# Patient Record
Sex: Male | Born: 1935 | Race: Black or African American | Hispanic: No | Smoking: Former smoker
Health system: Southern US, Community
[De-identification: ages and names within clinical notes are randomized; demographics above are authoritative.]

## PROBLEM LIST (undated history)

## (undated) DIAGNOSIS — D509 Iron deficiency anemia, unspecified: Secondary | ICD-10-CM

## (undated) DIAGNOSIS — I00 Rheumatic fever without heart involvement: Secondary | ICD-10-CM

## (undated) DIAGNOSIS — R195 Other fecal abnormalities: Secondary | ICD-10-CM

## (undated) DIAGNOSIS — N183 Chronic kidney disease, stage 3 unspecified: Secondary | ICD-10-CM

## (undated) DIAGNOSIS — I350 Nonrheumatic aortic (valve) stenosis: Secondary | ICD-10-CM

## (undated) DIAGNOSIS — I2510 Atherosclerotic heart disease of native coronary artery without angina pectoris: Secondary | ICD-10-CM

## (undated) DIAGNOSIS — I10 Essential (primary) hypertension: Secondary | ICD-10-CM

## (undated) DIAGNOSIS — K219 Gastro-esophageal reflux disease without esophagitis: Secondary | ICD-10-CM

## (undated) DIAGNOSIS — C61 Malignant neoplasm of prostate: Secondary | ICD-10-CM

## (undated) DIAGNOSIS — M1990 Unspecified osteoarthritis, unspecified site: Secondary | ICD-10-CM

## (undated) HISTORY — PX: UMBILICAL HERNIA REPAIR: SHX2598

## (undated) HISTORY — PX: PROSTATE SURGERY: SHX751

## (undated) HISTORY — PX: CAROTID ENDARTERECTOMY: SUR193

---

## 2001-05-08 ENCOUNTER — Ambulatory Visit (HOSPITAL_COMMUNITY): Admission: RE | Admit: 2001-05-08 | Discharge: 2001-05-08 | Payer: Self-pay | Admitting: General Surgery

## 2001-12-27 ENCOUNTER — Encounter: Payer: Self-pay | Admitting: Family Medicine

## 2001-12-27 ENCOUNTER — Ambulatory Visit (HOSPITAL_COMMUNITY): Admission: RE | Admit: 2001-12-27 | Discharge: 2001-12-27 | Payer: Self-pay | Admitting: Family Medicine

## 2002-06-25 ENCOUNTER — Encounter: Payer: Self-pay | Admitting: Emergency Medicine

## 2002-06-25 ENCOUNTER — Emergency Department (HOSPITAL_COMMUNITY): Admission: EM | Admit: 2002-06-25 | Discharge: 2002-06-25 | Payer: Self-pay | Admitting: Emergency Medicine

## 2002-07-02 ENCOUNTER — Ambulatory Visit (HOSPITAL_COMMUNITY): Admission: RE | Admit: 2002-07-02 | Discharge: 2002-07-02 | Payer: Self-pay | Admitting: Neurology

## 2002-07-02 ENCOUNTER — Encounter: Payer: Self-pay | Admitting: Neurology

## 2002-07-04 ENCOUNTER — Encounter: Payer: Self-pay | Admitting: Vascular Surgery

## 2002-07-07 ENCOUNTER — Inpatient Hospital Stay (HOSPITAL_COMMUNITY): Admission: RE | Admit: 2002-07-07 | Discharge: 2002-07-08 | Payer: Self-pay | Admitting: Vascular Surgery

## 2002-07-07 ENCOUNTER — Encounter (INDEPENDENT_AMBULATORY_CARE_PROVIDER_SITE_OTHER): Payer: Self-pay | Admitting: *Deleted

## 2002-08-27 ENCOUNTER — Ambulatory Visit (HOSPITAL_COMMUNITY): Admission: RE | Admit: 2002-08-27 | Discharge: 2002-08-27 | Payer: Self-pay | Admitting: General Surgery

## 2002-12-17 ENCOUNTER — Ambulatory Visit (HOSPITAL_COMMUNITY): Admission: RE | Admit: 2002-12-17 | Discharge: 2002-12-17 | Payer: Self-pay | Admitting: Family Medicine

## 2002-12-17 ENCOUNTER — Encounter: Payer: Self-pay | Admitting: Family Medicine

## 2004-01-08 ENCOUNTER — Emergency Department (HOSPITAL_COMMUNITY): Admission: EM | Admit: 2004-01-08 | Discharge: 2004-01-08 | Payer: Self-pay | Admitting: *Deleted

## 2004-01-18 ENCOUNTER — Ambulatory Visit (HOSPITAL_COMMUNITY): Admission: RE | Admit: 2004-01-18 | Discharge: 2004-01-18 | Payer: Self-pay | Admitting: Family Medicine

## 2004-08-17 ENCOUNTER — Emergency Department (HOSPITAL_COMMUNITY): Admission: EM | Admit: 2004-08-17 | Discharge: 2004-08-18 | Payer: Self-pay | Admitting: *Deleted

## 2005-02-07 ENCOUNTER — Inpatient Hospital Stay (HOSPITAL_COMMUNITY): Admission: AD | Admit: 2005-02-07 | Discharge: 2005-02-17 | Payer: Self-pay | Admitting: Family Medicine

## 2005-02-27 ENCOUNTER — Emergency Department (HOSPITAL_COMMUNITY): Admission: EM | Admit: 2005-02-27 | Discharge: 2005-02-27 | Payer: Self-pay | Admitting: *Deleted

## 2005-10-02 ENCOUNTER — Ambulatory Visit (HOSPITAL_COMMUNITY): Admission: RE | Admit: 2005-10-02 | Discharge: 2005-10-02 | Payer: Self-pay | Admitting: Family Medicine

## 2006-08-17 ENCOUNTER — Ambulatory Visit (HOSPITAL_COMMUNITY): Admission: RE | Admit: 2006-08-17 | Discharge: 2006-08-17 | Payer: Self-pay | Admitting: Family Medicine

## 2006-08-23 ENCOUNTER — Ambulatory Visit (HOSPITAL_COMMUNITY): Admission: RE | Admit: 2006-08-23 | Discharge: 2006-08-23 | Payer: Self-pay | Admitting: Family Medicine

## 2007-08-14 ENCOUNTER — Ambulatory Visit (HOSPITAL_COMMUNITY): Admission: RE | Admit: 2007-08-14 | Discharge: 2007-08-14 | Payer: Self-pay | Admitting: Cardiology

## 2008-11-12 ENCOUNTER — Ambulatory Visit (HOSPITAL_COMMUNITY): Admission: RE | Admit: 2008-11-12 | Discharge: 2008-11-12 | Payer: Self-pay | Admitting: Family Medicine

## 2008-12-11 ENCOUNTER — Ambulatory Visit (HOSPITAL_COMMUNITY): Admission: RE | Admit: 2008-12-11 | Discharge: 2008-12-11 | Payer: Self-pay | Admitting: Family Medicine

## 2009-01-04 ENCOUNTER — Ambulatory Visit (HOSPITAL_COMMUNITY): Admission: RE | Admit: 2009-01-04 | Discharge: 2009-01-04 | Payer: Self-pay | Admitting: Urology

## 2009-01-07 ENCOUNTER — Ambulatory Visit: Payer: Self-pay | Admitting: Gastroenterology

## 2009-01-15 ENCOUNTER — Ambulatory Visit: Payer: Self-pay | Admitting: Gastroenterology

## 2009-01-15 ENCOUNTER — Encounter: Payer: Self-pay | Admitting: Gastroenterology

## 2009-01-15 ENCOUNTER — Ambulatory Visit (HOSPITAL_COMMUNITY): Admission: RE | Admit: 2009-01-15 | Discharge: 2009-01-15 | Payer: Self-pay | Admitting: Gastroenterology

## 2009-01-19 ENCOUNTER — Encounter: Payer: Self-pay | Admitting: Gastroenterology

## 2009-03-03 ENCOUNTER — Encounter (HOSPITAL_COMMUNITY): Admission: RE | Admit: 2009-03-03 | Discharge: 2009-04-02 | Payer: Self-pay | Admitting: Family Medicine

## 2009-06-09 ENCOUNTER — Emergency Department (HOSPITAL_COMMUNITY): Admission: EM | Admit: 2009-06-09 | Discharge: 2009-06-10 | Payer: Self-pay | Admitting: Emergency Medicine

## 2009-09-26 ENCOUNTER — Inpatient Hospital Stay (HOSPITAL_COMMUNITY): Admission: EM | Admit: 2009-09-26 | Discharge: 2009-09-30 | Payer: Self-pay | Admitting: Emergency Medicine

## 2010-05-06 ENCOUNTER — Ambulatory Visit (HOSPITAL_COMMUNITY): Admission: RE | Admit: 2010-05-06 | Discharge: 2010-05-06 | Payer: Self-pay | Admitting: Family Medicine

## 2010-06-08 ENCOUNTER — Ambulatory Visit (HOSPITAL_COMMUNITY): Admission: RE | Admit: 2010-06-08 | Discharge: 2010-06-08 | Payer: Self-pay | Admitting: Family Medicine

## 2010-12-17 ENCOUNTER — Encounter: Payer: Self-pay | Admitting: Family Medicine

## 2011-02-06 ENCOUNTER — Other Ambulatory Visit (HOSPITAL_COMMUNITY): Payer: Self-pay | Admitting: Family Medicine

## 2011-02-06 ENCOUNTER — Ambulatory Visit (HOSPITAL_COMMUNITY)
Admission: RE | Admit: 2011-02-06 | Discharge: 2011-02-06 | Disposition: A | Discharge status: Home / Self Care | Payer: Medicare Other | Source: Ambulatory Visit | Attending: Family Medicine | Admitting: Family Medicine

## 2011-02-06 DIAGNOSIS — M25519 Pain in unspecified shoulder: Secondary | ICD-10-CM | POA: Insufficient documentation

## 2011-02-06 DIAGNOSIS — S79919A Unspecified injury of unspecified hip, initial encounter: Secondary | ICD-10-CM | POA: Insufficient documentation

## 2011-02-06 DIAGNOSIS — S79929A Unspecified injury of unspecified thigh, initial encounter: Secondary | ICD-10-CM | POA: Insufficient documentation

## 2011-02-06 DIAGNOSIS — W19XXXA Unspecified fall, initial encounter: Secondary | ICD-10-CM | POA: Insufficient documentation

## 2011-02-06 DIAGNOSIS — M25559 Pain in unspecified hip: Secondary | ICD-10-CM | POA: Insufficient documentation

## 2011-03-01 LAB — COMPREHENSIVE METABOLIC PANEL
AST: 18 U/L (ref 0–37)
Albumin: 3 g/dL — ABNORMAL LOW (ref 3.5–5.2)
Alkaline Phosphatase: 53 U/L (ref 39–117)
Chloride: 101 mEq/L (ref 96–112)
GFR calc Af Amer: >60 mL/min (ref >60)
Potassium: 3.5 mEq/L (ref 3.5–5.1)
Total Bilirubin: 0.6 mg/dL (ref 0.3–1.2)
Total Protein: 6.7 g/dL (ref 6.0–8.3)

## 2011-03-01 LAB — CBC
HCT: 33.8 % — ABNORMAL LOW (ref 39.0–52.0)
HCT: 34.2 % — ABNORMAL LOW (ref 39.0–52.0)
Hemoglobin: 11.1 g/dL — ABNORMAL LOW (ref 13.0–17.0)
Hemoglobin: 11.3 g/dL — ABNORMAL LOW (ref 13.0–17.0)
MCHC: 33 g/dL (ref 30.0–36.0)
MCV: 86.9 fL (ref 78.0–100.0)
MCV: 87 fL (ref 78.0–100.0)
Platelets: 275 10*3/uL (ref 150–400)
Platelets: 279 10*3/uL (ref 150–400)
RBC: 3.89 MIL/uL — ABNORMAL LOW (ref 4.22–5.81)
RDW: 15.2 % (ref 11.5–15.5)
RDW: 15.2 % (ref 11.5–15.5)
WBC: 5.6 10*3/uL (ref 4.0–10.5)
WBC: 6.3 10*3/uL (ref 4.0–10.5)

## 2011-03-01 LAB — BASIC METABOLIC PANEL
BUN: 14 mg/dL (ref 6–23)
CO2: 23 mEq/L (ref 19–32)
Chloride: 103 mEq/L (ref 96–112)
GFR calc Af Amer: >60 mL/min (ref >60)
GFR calc non Af Amer: >60 mL/min (ref >60)
Glucose, Bld: 187 mg/dL — ABNORMAL HIGH (ref 70–99)
Potassium: 4 mEq/L (ref 3.5–5.1)
Potassium: 4.2 mEq/L (ref 3.5–5.1)
Sodium: 134 mEq/L — ABNORMAL LOW (ref 135–145)
Sodium: 137 mEq/L (ref 135–145)

## 2011-03-01 LAB — VITAMIN B12: Vitamin B-12: 173 pg/mL — ABNORMAL LOW (ref 211–911)

## 2011-03-01 LAB — IRON AND TIBC
Iron: 15 ug/dL — ABNORMAL LOW (ref 42–135)
TIBC: 327 ug/dL (ref 215–435)
UIBC: 312 ug/dL

## 2011-03-01 LAB — MICROALBUMIN / CREATININE URINE RATIO
Creatinine, Urine: 148.2 mg/dL
Microalb Creat Ratio: 264.4 mg/g — ABNORMAL HIGH (ref 0.0–30.0)

## 2011-03-01 LAB — GLUCOSE, CAPILLARY
Glucose-Capillary: 120 mg/dL — ABNORMAL HIGH (ref 70–99)
Glucose-Capillary: 162 mg/dL — ABNORMAL HIGH (ref 70–99)
Glucose-Capillary: 176 mg/dL — ABNORMAL HIGH (ref 70–99)
Glucose-Capillary: 192 mg/dL — ABNORMAL HIGH (ref 70–99)

## 2011-03-01 LAB — RETICULOCYTES
RBC.: 3.91 MIL/uL — ABNORMAL LOW (ref 4.22–5.81)
Retic Count, Absolute: 35.2 10*3/uL (ref 19.0–186.0)

## 2011-03-01 LAB — FERRITIN: Ferritin: 18 ng/mL — ABNORMAL LOW (ref 22–322)

## 2011-03-01 LAB — GLYCOHEMOGLOBIN, TOTAL: Hemoglobin-A1c: 11.6 % — ABNORMAL HIGH (ref 5.4–7.4)

## 2011-03-02 LAB — CBC
HCT: 36.3 % — ABNORMAL LOW (ref 39.0–52.0)
Hemoglobin: 12 g/dL — ABNORMAL LOW (ref 13.0–17.0)
MCHC: 33 g/dL (ref 30.0–36.0)
MCV: 86.1 fL (ref 78.0–100.0)
Platelets: 323 10*3/uL (ref 150–400)
RBC: 4.22 MIL/uL (ref 4.22–5.81)
RDW: 14.9 % (ref 11.5–15.5)
WBC: 7.2 10*3/uL (ref 4.0–10.5)

## 2011-03-02 LAB — URINALYSIS, ROUTINE W REFLEX MICROSCOPIC
Bilirubin Urine: NEGATIVE
Glucose, UA: NEGATIVE mg/dL
Ketones, ur: NEGATIVE mg/dL
Leukocytes, UA: NEGATIVE
Nitrite: NEGATIVE
Protein, ur: >300 mg/dL — AB
Specific Gravity, Urine: 1.012 (ref 1.005–1.030)
Urobilinogen, UA: 0.2 mg/dL (ref 0.0–1.0)
pH: 7 (ref 5.0–8.0)

## 2011-03-02 LAB — COMPREHENSIVE METABOLIC PANEL
ALT: 12 U/L (ref 0–53)
AST: 20 U/L (ref 0–37)
Albumin: 3.7 g/dL (ref 3.5–5.2)
Alkaline Phosphatase: 67 U/L (ref 39–117)
BUN: 13 mg/dL (ref 6–23)
CO2: 24 mEq/L (ref 19–32)
Calcium: 10 mg/dL (ref 8.4–10.5)
Chloride: 102 mEq/L (ref 96–112)
Creatinine, Ser: 1 mg/dL (ref 0.4–1.5)
GFR calc Af Amer: >60 mL/min (ref >60)
GFR calc non Af Amer: >60 mL/min (ref >60)
Glucose, Bld: 155 mg/dL — ABNORMAL HIGH (ref 70–99)
Potassium: 3.3 mEq/L — ABNORMAL LOW (ref 3.5–5.1)
Sodium: 137 mEq/L (ref 135–145)
Total Bilirubin: 0.6 mg/dL (ref 0.3–1.2)
Total Protein: 7.9 g/dL (ref 6.0–8.3)

## 2011-03-02 LAB — DIFFERENTIAL
Basophils Relative: 0 % (ref 0–1)
Eosinophils Absolute: 0 10*3/uL (ref 0.0–0.7)
Eosinophils Relative: 0 % (ref 0–5)
Lymphs Abs: 1.1 10*3/uL (ref 0.7–4.0)
Monocytes Absolute: 0.6 10*3/uL (ref 0.1–1.0)
Monocytes Relative: 8 % (ref 3–12)
Neutrophils Relative %: 76 % (ref 43–77)

## 2011-03-02 LAB — GLUCOSE, CAPILLARY: Glucose-Capillary: 144 mg/dL — ABNORMAL HIGH (ref 70–99)

## 2011-03-02 LAB — URINE MICROSCOPIC-ADD ON

## 2011-03-02 LAB — POCT CARDIAC MARKERS: Myoglobin, poc: 500 ng/mL (ref 12–200)

## 2011-03-05 LAB — DIFFERENTIAL
Eosinophils Absolute: 0.1 10*3/uL (ref 0.0–0.7)
Lymphs Abs: 1.4 10*3/uL (ref 0.7–4.0)
Monocytes Absolute: 0.4 10*3/uL (ref 0.1–1.0)
Monocytes Relative: 11 % (ref 3–12)
Neutrophils Relative %: 44 % (ref 43–77)

## 2011-03-05 LAB — CBC
Hemoglobin: 10.5 g/dL — ABNORMAL LOW (ref 13.0–17.0)
MCHC: 33.7 g/dL (ref 30.0–36.0)
MCV: 92.5 fL (ref 78.0–100.0)
RBC: 3.37 MIL/uL — ABNORMAL LOW (ref 4.22–5.81)
WBC: 3.3 10*3/uL — ABNORMAL LOW (ref 4.0–10.5)

## 2011-03-05 LAB — BASIC METABOLIC PANEL
CO2: 27 mEq/L (ref 19–32)
Chloride: 105 mEq/L (ref 96–112)
Creatinine, Ser: 1.48 mg/dL (ref 0.4–1.5)
GFR calc Af Amer: 55 mL/min — ABNORMAL LOW (ref >60)
Potassium: 3.6 mEq/L (ref 3.5–5.1)
Sodium: 138 mEq/L (ref 135–145)

## 2011-03-14 LAB — GLUCOSE, CAPILLARY: Glucose-Capillary: 99 mg/dL (ref 70–99)

## 2011-03-14 LAB — FERRITIN: Ferritin: 17 ng/mL — ABNORMAL LOW (ref 22–322)

## 2011-04-11 NOTE — Consult Note (Signed)
NAMEALEXY, HELDT              ACCOUNT NO.:  [id number]   MEDICAL RECORD NO.:  [id number]         PATIENT TYPE:  PAMB   LOCATION:  DAY                           FACILITY:  [facility]   PHYSICIAN:  Kassie Mends, M.D.      DATE OF BIRTH:  [birth year]   DATE OF CONSULTATION:  DATE OF DISCHARGE:                                 CONSULTATION   REFERRING PHYSICIAN:  Annia Friendly. Hill, MD   REASON FOR CONSULTATION:  Rectal bleeding, heme-positive stools, and  anemia.   HISTORY OF PRESENT ILLNESS:  Mr. Lahaie is an 77 year old male who  reports having a colonoscopy approximately 5 years ago with Dr. Katrinka Blazing.  He said he had polyps.  The report is not available to me today.  At his  visit with Dr. Loleta Chance in January 2010, bright red blood was seen on his  adult brief, painless hematuria was noted, and he was heme-positive.  He  was referred because of the findings.  He does take aspirin daily.  He  reports he has intermittent heartburn and think the blood detected in  his stool was related to that.  He states he has heartburn and  indigestion depending on what he eats.  He has foamy urine and thinks  that is related to uric acid in his urine.  He denies any diarrhea.  He  does have a change in his bowel habits over the last year.  He now has  pellet-like stool.  He denies any weight loss and his appetite has been  good.  Five to six years ago, he did have black stool, but never had an  endoscopy.  The symptoms lasted a half-day.  He denies any nausea,  vomiting, or problems with swallowing.  He rarely has abdominal pain.  He does take aspirin daily.  He denies any use of ibuprofen, Motrin,  Aleve, BC or Goody powders.  He uses Excedrin, Tylenol, and hydrocodone  for his pain.   PAST MEDICAL HISTORY:  1. Diabetes.  2. GERD.  3. Prostate cancer.  4. Hypertension.  5. Hyperlipidemia.  6. 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   PAST SURGICAL HISTORY:  1. Bilateral cataract surgery.  2.  Prostatectomy  3. Three back surgeries.   ALLERGIES:  No known drug allergies.   MEDICATIONS:  The list was faxed from [facility] and [facility] in  [place].  1. Famciclovir.  2. Lantus.  3. Acetaminophen with codeine.  4. Prednisone.  5. Hydrocodone with acetaminophen.  6. Ciprofloxacin.  7. Benzonatate capsules.  8. Carafate.  9. Lisinopril.  10.Clorpres.  11.Amlodipine.   FAMILY HISTORY:  He denies any family history of colon cancer or colon  polyps.   SOCIAL HISTORY:  He is divorced.  He has 6 children.  He was a Runner, broadcasting/film/video  in [place], [place], and [place].  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  He  denies any tobacco or alcohol use.   REVIEW OF SYSTEMS:  As per the HPI; otherwise, all systems are negative.   PHYSICAL EXAMINATION:  VITAL SIGNS:  Weight 161 pounds, height 5 feet 5  inches, BMI  26.8 (slightly overweight), temperature 98, blood pressure  170/90, and pulse 76.  GENERAL:  He is in no apparent distress, alert, and oriented x4.  He is  somewhat hard of hearing.  HEENT:  Atraumatic and normocephalic.  Pupils reactive.  He has changes  consistent with bilateral cataract removal.  Mouth has no oral lesions.  Posterior pharynx without erythema or exudate.  NECK:  Full range of motion.  No lymphadenopathy.  LUNGS:  Clear to auscultation bilaterally.  CARDIOVASCULAR:  Regular rhythm.  No murmur.  Normal S1 and S2.  ABDOMEN:  Bowel sounds were present, soft, nontender, and nondistended.  No rebound or guarding.  He has no hepatomegaly or abdominal bruits.  EXTREMITIES:  No cyanosis or edema.  NEURO:  He has no new focal neurologic deficits.   LABORATORY VALUES:  In November 2009:  White count 4.2, hemoglobin 12.9  with an MCV of 89.2, platelet 221, BUN 19, creatinine 1.45, and glucose  176.   ASSESSMENT:  Mr. Havlin is an 77 year old male with rectal bleeding,  hemoccult-positive stool, and a normocytic anemia with likely mild renal  insufficiency.  The  differential diagnoses for his symptoms include:  Colorectal polyp or malignancy, gastritis, esophagitis, and a low  likelihood of gastric malignancy or arteriovenous malformations.  Thank  you for allowing me to see Mr. Cutsforth in consultation.  My  recommendations are as follows.   RECOMMENDATIONS:  1. We will perform a colonoscopy and possibly an EGD next week.  He      will be given the HalfLytely bowel prep.  He was instructed to take      half of his Lantus on the day before his exam.  2. His followup will be scheduled after the upper endoscopy is      complete.  If no source for his normocytic anemia can be      identified, then we will recheck his CBC and ferritin on the day of      his endoscopy.   ADDENDUM 95621:  Reviewed report from 2002. TCS 2002 performed for  constipation and history of polyps. It showed a tubular adenoma (<6mm)-1  snared, 3 snared but not retrieved. Mild diverticulosis.      Kassie Mends, M.D.  Electronically Signed     SM/MEDQ  D:  01/07/2009  T:  01/07/2009  Job:  [id number]   cc:   Annia Friendly. Loleta Chance, MD  Fax: [phone]

## 2011-04-11 NOTE — Op Note (Signed)
[name], [name]              ACCOUNT NO.:  [id number]   MEDICAL RECORD NO.:  [id number]          PATIENT TYPE:  AMB   LOCATION:  DAY                           FACILITY:  [facility]   PHYSICIAN:  Kassie Mends, M.D.      DATE OF BIRTH:  1920/05/31   DATE OF PROCEDURE:  01/15/2009  DATE OF DISCHARGE:                               OPERATIVE REPORT   REFERRING PHYSICIAN:  Annia Friendly. Hill, MD   PROCEDURES:  1. Colonoscopy with cecal arteriovenous malformation ablation.  2. Esophagogastroduodenoscopy with cold forceps biopsies and ablation      of gastric arteriovenous malformations.   INDICATION FOR EXAMINATION:  [name] is an 77 year old male with heme-  positive stools and normocytic anemia.  He also complains of rectal  bleeding.   FINDINGS:  1. Large internal hemorrhoids.  Otherwise, normal retroflexed view of      the rectum.  2. Normal distal terminal ileum, approximately 5 cm visualized.  3. 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  The largest was approximately 3 mm.      The cecal AVMs easily bled when the BICAP probe passed over it.      Cautery was applied (20 watts) using the 7-French BICAP probe.  All      lesions were ablated.  4. Patent Schatzki's ring.  Otherwise, no evidence of Barrett's mass,      erosions, or ulcerations.  5. Multiple gastric erosions with evidence of active oozing.  The      lesions were in the body and the antrum.  Two single gastric AVMs      seen.  The gastric AVMs were ablated using the 7-French BICAP probe      (20 watts).  Cold forceps biopsies were taken to evaluate for H.      pylori gastritis.  6. Multiple superficial erosions seen in the duodenal bulb.  Normal      ampulla and second portion of the duodenum.   DIAGNOSES:  1. Rectal bleeding secondary to internal hemorrhoids.  2. Normocytic anemia secondary to gastric and cecal arteriovenous      malformations as well as severe gastritis and duodenitis.   RECOMMENDATIONS:  1. Avoid aspirin for  30 days.  He may restart the aspirin on February 13, 2009.  2. He should begin omeprazole 20 mg daily and continue indefinitely      while using an aspirin.  3. No anticoagulation for 7 days.  4. Will call Mr. Speranza with the results of his biopsies.  5. He should follow a high-fiber diet.  He is given a handout on the      high-fiber diet.  6. Screening colonoscopy in 10-15 years if he remains healthy.   MEDICATIONS:  1. Demerol 50 mg IV.  2. Versed 3 mg IV.   PROCEDURE TECHNIQUE:  Physical exam was performed.  Informed consent was  obtained from the patient after explaining the benefits, risks, and  alternatives to the procedure.  The patient was connected to the  monitor, placed in left lateral position.  Continuous oxygen was  provided by nasal cannula.  IV medicine administered through an  indwelling cannula.  After administration of sedation and rectal exam,  the patient's rectum was intubated and the scope was advanced under  direct visualization to the distal terminal ileum.  The scope was moved  slowly by carefully examining the color, texture, anatomy, and integrity  of the mucosa on the way out.  After the colonoscopy, the patient's  esophagus was intubated with a diagnostic gastroscope.  The scope was  advanced under direct visualization to the second portion of the  duodenum.  Scope was removed slowly by carefully examining the color,  texture, anatomy, and integrity of the mucosa on the way out.  The  patient was recovered in Endoscopy and discharged home in satisfactory  condition.   PATH:  Mild gastritis.      Kassie Mends, M.D.  Electronically Signed     SM/MEDQ  D:  01/15/2009  T:  01/16/2009  Job:  [id number]   cc:   Annia Friendly. Loleta Chance, MD  Fax: [phone]

## 2011-04-14 NOTE — H&P (Signed)
[name], [name]                          ACCOUNT NO.:  [id number]   MEDICAL RECORD NO.:  [id number]                   PATIENT TYPE:  INP   LOCATION:  3306                                 FACILITY:  [facility]   PHYSICIAN:  Larina Earthly, M.D.                 DATE OF BIRTH:  08-25-1920   DATE OF ADMISSION:  07/07/2002  DATE OF DISCHARGE:                                HISTORY & PHYSICAL   NEUROLOGIST:  Kofi A. Gerilyn Pilgrim, M.D.   PRIMARY CARE PHYSICIAN:  Annia Friendly. Loleta Chance, M.D.   PRESENTING CIRCUMSTANCE:  I am here to get ready for surgery.   HISTORY OF PRESENT ILLNESS:  The patient is a 77 year old male referred by  Dr. Gerilyn Pilgrim for evaluation of carotid artery disease.  The patient has  experienced left arm and left hand weakness which have fully resolved about  one week ago.  A carotid duplex verifies right internal carotid artery flow  reduction.  In light of symptoms Dr. Arbie Cookey has recommended proceeding with  right carotid endarterectomy.  The patient reports transient tingling in the  left hand and weakness in the left arm.  He has shortness of breath with  exertion, especially when climbing stairs.  He also claims claudication of  the right lower extremity.  He used to walk 3 miles and now he claudicates  during the course of 1 mile.  This usually goes away with resting.  He has  no prior history of atherosclerotic coronary artery disease, myocardial  infarction, cerebrovascular accident, peptic ulcer disease, or kidney  disease.  He is an oral medication-controlled diabetic.   PAST MEDICAL HISTORY:  1. Extracranial cerebrovascular occlusive disease.  2. Hypertension.  3. Type 2 diabetes mellitus.  4. History of prostate cancer.  5. Gout.  6. Eczema of the left shoulder and neck.   Past medical history is not significant for heart disease or strokes.  It  seems that many of his siblings had problems with alcoholic beverages.   PAST SURGICAL HISTORY:  1. Status post  radical prostatectomy, March 1992, followed by serial PSA     studies which have been negative.  2. Scheduled for cataract surgery on July 07, 2002, but canceled.  3. Right herniorrhaphy in 1984.  4. Status post umbilical herniorrhaphy in June 1943.  5. He also has a torn right rotator cuff.   CURRENT MEDICATIONS:  1. Clonidine/chlorthalidone 0.1/15 one tablet daily.  2. Prinivil 5 mg daily.  3. Glucovance 500/5 b.i.d.  4. Norvasc 5 mg daily.  5. Allopurinol 300 mg daily.   ALLERGIES:  No known drug allergies.   SOCIAL HISTORY:  The patient is divorced.  He has four children, all of whom  are in good health.  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  He does not  partake of  alcoholic beverages.  He smoked three packs a day for 30 years  but quit 34 years ago.   PHYSICAL EXAMINATION:  GENERAL:  This is an alert and oriented male with  acute insight for someone who is 77 years of age.  He is in no acute  distress.  He is alert and oriented times three.   VITAL SIGNS:  Blood pressure is 128/62 in the right upper extremity.  Pulse  is 80 and regular.   HEENT:  The head is normocephalic and atraumatic.  Eyes, pupils equal, round  and reactive to light.  Extraocular movements are intact.  He does have  cataracts bilaterally.  Nares are patent.  Oropharynx shows that he wears an  upper denture and partial lower denture.   NECK:  Supple, no jugulovenous distention, no carotid bruit auscultated.   LUNGS:  Clear to auscultation and percussion bilaterally.   HEART:  Regular rate and rhythm without murmurs, rubs or gallops.   ABDOMEN:  Soft and nondistended.  Bowel sounds are present.  No masses or  bruit auscultated.   EXTREMITIES:  Examination shows no evidence of clubbing, cyanosis, edema or  ulcerations.  There is a palpable left dorsalis pedis pulse; the right  dorsalis pedis was not palpable.  Femoral  pulses are 4 over 4 bilaterally.   NEUROLOGIC:  No focal deficits.  Gait is steady.  Deep tendon reflexes are  2+ bilaterally in the patellar region.  He has good grip strength in both  upper and lower extremities.  There is no clumsiness and no incoordination.   ASSESSMENT:  Symptomatic right internal carotid artery stenosis.   PLAN:  Right carotid endarterectomy on July 07, 2002, by Dr. Gretta Began.     [name], [place]                      Larina Earthly, M.D.    GM/MEDQ  D:  07/04/2002  T:  07/08/2002  Job:  [id number]

## 2011-04-14 NOTE — Procedures (Signed)
   NAMEDEANTA, [name]                          ACCOUNT NO.:  [id number]   MEDICAL RECORD NO.:  [id number]                   PATIENT TYPE:  OUT   LOCATION:  RAD                                  FACILITY:  [facility]   PHYSICIAN:  Fredirick Maudlin, M.D.              DATE OF BIRTH:  [birth year]   DATE OF PROCEDURE:  06/25/2002  DATE OF DISCHARGE:                                EKG INTERPRETATION   The rhythm is a sinus rhythm with a rate in the 80s.  There are diffuse ST-T  wave changes which could indicate ischemia.  These are most marked inferior  and laterally.  Abnormal electrocardiogram.                                               Fredirick Maudlin, M.D.    ELH/MEDQ  D:  06/26/2002  T:  07/02/2002  Job:  [id number]

## 2011-04-14 NOTE — H&P (Signed)
[name], [name]              ACCOUNT NO.:  [id number]   MEDICAL RECORD NO.:  [id number]          PATIENT TYPE:  INP   LOCATION:  A207                          FACILITY:  [facility]   PHYSICIAN:  Annia Friendly. 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   CHIEF COMPLAINT:  Weakness.   HISTORY OF PRESENT ILLNESS:  History is positive for weakness over six days.  The patient experienced some dizziness at times during standing.  Appetite  has been down.  He denies chest pain, shortness of breath, diarrhea, nausea,  vomiting, abdominal pain, melena, hematochezia, gross hematuria,  palpitations, cough, fever, chills, wheezing, and syncope.  The patient  admitted to not taking meds as directed.   MEDICAL HISTORY:  1.  Hypertension.  2.  Type 2 diabetes mellitus.  3.  Gouty arthritis.  4.  Degenerative joint disease of the lumbar spine.  5.  Hyperlipidemia.  Medical history is negative for tuberculosis, sickle cell, asthma, seizure  disorder.   PRESCRIBED MEDICATIONS:  On admission are:  1.  Zyloprim 300 mg p.o. every day.  2.  Norvasc 5 mg p.o. every day.  3.  Glucovance 5/500 p.o. b.i.d.  4.  Clorpres 0.1/15, one tablet p.o. every day.   The patient is not allergic to any known medications.   HABITS:  Positive former use of cigarette smoking x 40 years and positive  for social use of alcohol socially.   Past medical history is positive for hospitalizations for:  1.  Bilateral staging pelvic lymphadenectomy and retropubic radical      prostatectomy on February 19, 1991, secondary to stage B1 adenocarcinoma of      the prostate by Dr. __________  .  2.  Repair umbilical hernia, age 20.  3.  Rheumatic fever at age 79.  4.  Right inguinal hernia repair in the patient's 99s, in [place].   FAMILY HISTORY:  Revealed mother deceased, age 17, secondary to cancer.  Father deceased, age 66, cause unknown.  Two sisters deceased, age 74  secondary to congestive heart failure and age 94 secondary to congestive  heart failure.  Two brothers deceased, age 54 secondary to complications of  alcohol abuse and age 63 cause unknown.  Four daughters living age 56 - good  health, age 67 - good health, age 45 - history of diabetes mellitus, and age  4- good health.   REVIEW OF SYSTEMS:  Positive for chronic intermittent back pain, episodic  constipation.  Review of systems negative for epistaxis, bleeding gums,  dysphagia, unexplained weight loss, dysuria, urinary hesitancy, urinary  incontinence, fecal incontinence, headache, diplopia, blurry vision,  etcetera.   SURGICAL HISTORY:  Also positive for:  1.  Left cornea implant.  2.  Right carotid endarterectomy, July 07, 2002, by Dr. Gretta Began.   PHYSICAL EXAMINATION:  VITAL SIGNS:  Temperature 98.7, pulse 71,  respirations 22, blood pressure 157/69.  GENERAL APPEARANCE:  Revealed an elderly, slight, medium framed, medium  height, alert, black male in no apparent respiratory distress.  HEAD:  Positive male pattern baldness.  EARS:  Normal auricles.  External canals positive for cerumen.  EYES:  Lids negative ptosis.  Sclerae are white.  The left eye is positive  for corneal implant.  Extraocular movements intact.  NOSE:  Negative for discharge.  MOUTH:  No dentition at top.  No bleeding gums.  No oral lesions.  THROAT:  Posterior pharynx benign.  NECK:  Negative for adenopathy or thyromegaly.  LUNGS:  Clear.  HEART:  Audible S1 S2 without murmur.  Regular rate and rhythm.  BREASTS:  No sign of enlargement.  ABDOMEN:  Slightly obese.  Positive healed horizontal inferior umbilical  surgical scar.  Soft, nontender all four quadrants.  No palpable masses.  No  organomegaly.  Right groin positive for old healed surgical  scar.  GENITALIA:  Penis uncircumcised.  No penile lesions or discharge.  Scrotum,  palpable testicle without nodule or tenderness.  RECTAL:  No external lesions.  Digital exam prostate vault empty.  No  palpable rectal vault masses.  Stool guaiac positive.  EXTREMITIES:  No edema.  No joint swelling.  No joint redness.  No joint  hotness.  NEUROLOGIC:  Alert and oriented x 3.  Cranial nerves II-XII appeared intact.  Peripheral pulses palpable femoral and dorsalis pedis bilaterally.   LABS:  White count 7.0, hemoglobin 9.7, hematocrit 28.6, platelets 258,000.  Sodium 135, potassium 3.4, chloride 101, CO2 25, glucose 123, BUN 22,  creatinine 1.5.  Total bilirubin 0.6, alkaline phosphatase 64, SGOT 19, SGPT  12, total protein 7.3, albumin 3.7, calcium 9.6.  Total CPK 169, CK-MB 2.2,  troponin I 0.03.  ABG on room air pH 7.44, pCO2 31.6, pO2 78.4, O2 sat  95.2%.   IMPRESSION:  1.  Primary weakness, probably secondary to gastrointestinal blood loss.  2.  Mild hypokalemia.   SECONDARY DIAGNOSES:  1.  Hypertension.  2.  Type 2 diabetes mellitus.  3.  Degenerative joint disease of the lumbar spine.  4.  Hyperlipidemia.   PLAN:  1.  Admit to telemetry x 24 hours.  2.  Cardiac enzymes q.8h. x a total of 3, thyroid panel, retic count, anemia      panel, diagnostic colonoscopy, CEA level.  3.  Hold aspirin like products.  4.  Pepcid 20 mg p.o. q.12h.  5.  IV normal saline at [facility].  6.  Chest x-ray.  7.  EKG.  8.  Accu-Chek b.i.d.  9.  Antihypertensive medication.  10. Oral hypoglycemic agent.  11. Insulin as  needed.      GKH/MEDQ  D:  02/07/2005  T:  02/07/2005  Job:  [id number]

## 2011-04-14 NOTE — Op Note (Signed)
[name], [name]                          ACCOUNT NO.:  [id number]   MEDICAL RECORD NO.:  [id number]                   PATIENT TYPE:  INP   LOCATION:  3306                                 FACILITY:  [facility]   PHYSICIAN:  Larina Earthly, M.D.                 DATE OF BIRTH:  June 22, 1920   DATE OF PROCEDURE:  07/07/2002  DATE OF DISCHARGE:  07/08/2002                                 OPERATIVE REPORT   PREOPERATIVE DIAGNOSIS:  Symptomatic right internal carotid artery stenosis.   POSTOPERATIVE DIAGNOSIS:  Symptomatic right internal carotid artery  stenosis.   OPERATION:  Right carotid endarterectomy and Dacron patch angioplasty.   SURGEON:  Larina Earthly, M.D.   ASSISTANT:  Toribio Harbour, R.N.   ANESTHESIA:  General endotracheal   COMPLICATIONS:  None.   DISPOSITION:  To recovery room neurologically intact.   DESCRIPTION OF PROCEDURE:  The patient was taken to the operating room and  placed in the supine position where the area of the right neck was prepped  and draped in the usual sterile fashion.  Incision was made anterior to the  sternocleidomastoid and carried down and carried down through the platysma  with electrocautery.  The sternocleidomastoid was resected posteriorly and  the carotid sheath was opened.  The facial vein was ligated with 2-0 silk  ties and divided.  The common carotid artery was encircled with an umbilical  tape and Rumel tourniquet.  The vagus and hypoglossal nerves were identified  and preserved.  Dissection was carried down to the bifurcation.  The  superior thyroid artery was encircled with a 2-0 silk Potts tie.  The  external carotid was encircled with a blue vessel loop.  The internal  carotid encircled with an umbilical tape and Rumel tourniquet.  The patient  was given 7000 units of intravenous Heparin.  After adequate circulation  time the internal and external common carotid arteries were occluded.  The  common carotid artery was  opened with 11 blade, Potts scissors to the  internal carotid.  A 12 stent was passed up the internal carotid out the  back leaf and down the common carotid where it was secured with Rumel  tourniquets.  No skin on the common carotid artery.  The plaque was divided  proximally with Potts scissors.  The endarterectomy was continued on to the  bifurcation.  The external carotids were endarterectomized with an eversion  technique.  The internal carotids were endarterectomized in an open fashion.  The remaining epidermis will be removed from the endarterectomy plane.  Hemisheild, Dacron patch was brought onto the field and sewn to this patch  angioplasty with a running 6-0 Prolene suture.  Prior to completion of  anastomosis the shunt was removed  and usual flushing taken.  The  anastomosis was completed.  The external, followed the common, finally the  internal carotid artery occlusion clamp was removed.  Excellent flow  characteristics was noted with hand held Doppler in the internal and  external carotid arteries.  The patient was given 50 mg of Protamine to  reverse the Heparin.  The wound was irrigated with saline.  Hemostasis was  obtained with electrocautery.  The wounds were closed with several 3-0  Vicryl sutures to reapproximate the sternocleidomastoid and midclavicular  sheath. Next the platysma was closed with a running 3-0 Vicryl suture.  Finally the skin was closed with 4-0 Vicryl subcuticular stitch. Benzoin and  Steri-Strips were applied.  The patient was taken to the recovery room in  stable condition.                                               Larina Earthly, M.D.    TFE/MEDQ  D:  07/07/2002  T:  07/09/2002  Job:  [id number]

## 2011-04-14 NOTE — H&P (Signed)
   NAMESTOCKTON, NUNLEY                          ACCOUNT NO.:  [id number]   MEDICAL RECORD NO.:  [id number]                   PATIENT TYPE:  AMB   LOCATION:  DAY                                  FACILITY:  [facility]   PHYSICIAN:  Jerolyn Shin C. Katrinka Blazing, M.D.                DATE OF BIRTH:  September 20, 1920   DATE OF ADMISSION:  DATE OF DISCHARGE:                                HISTORY & PHYSICAL   HISTORY OF PRESENT ILLNESS:  [age]-year-old male who is being worked up for  recurrent anemia.  He had colonoscopy last year and had small colon polyps.  He has a recent carotid endarterectomy and was noted to be anemic with  hemoglobin of 11.  His hemoglobin was as low as 8.  Serum iron was low at 14  and his iron saturation was only 3%.  He has not responded to iron therapy.  He is undergoing upper endoscopy as part of his anemic workup.  He denies  rectal bleeding or melena.   PAST HISTORY:  He has hypertension, diabetes mellitus, gouty arthritis,  history of prostate carcinoma.   PAST SURGICAL HISTORY:  Surgery includes a radical prostatectomy and right  carotid endarterectomy.   MEDICATIONS:  1. Norvasc 5 mg.  2. Lisinopril 5 mg every day.  3. Glucovance 5/500 mg b.i.d.  4. Zyloprim 300 mg every day.  5. ___________ 0.1/15 mg every day.  6. Enteric-coated aspirin one every day.   ALLERGIES:  He has no known drug allergies.   PHYSICAL EXAMINATION:  GENERAL:  On examination, he is a pleasant elderly  male in no acute distress.  VITAL SIGNS:  Blood pressure 122/68, pulse 76, respirations 16.  Weight 157  pounds.  HEENT:  HEENT is unremarkable except he is edentulous.  NECK:  Neck is supple.  He has a healing right neck scar with no bruit.  There is no thyromegaly or adenopathy.  LUNGS:  Lungs are clear to auscultation.  HEART:  Regular rate and rhythm without murmur, gallop or rub.  ABDOMEN:  Abdomen soft and nontender.  No masses.  RECTAL:  Normal.  Stool guaiac negative.  EXTREMITIES:  No  cyanosis, clubbing or edema.  NEUROLOGIC:  No focal motor, sensory or cerebellar deficits.   IMPRESSION:  1. Recurrent abdominal pain in a patient with a history of peptic ulcer     disease.  2. Atherosclerotic peripheral vascular disease.  3.     Diabetes mellitus.  4. Hypertension.   PLAN:  Upper endoscopy.                                               Dirk Dress. Katrinka Blazing, M.D.    LCS/MEDQ  D:  08/26/2002  T:  08/27/2002  Job:  [id number]

## 2011-04-14 NOTE — Discharge Summary (Signed)
[name], [name]              ACCOUNT NO.:  [id number]   MEDICAL RECORD NO.:  [id number]          PATIENT TYPE:  INP   LOCATION:  A207                          FACILITY:  [facility]   PHYSICIAN:  Annia Friendly. 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   CHIEF COMPLAINT:  Weakness over six days.  He experienced some dizziness at  times during standing.  Appetite had been down.  He denied chest pain,  shortness of breath, diarrhea, nausea, vomiting, abdominal pain, melena,  hematochezia, gross hematuria, palpitations, cough, fever, chills, syncope.   PAST MEDICAL HISTORY:  1.  Hypertension.  2.  Type 2 diabetes mellitus.  3.  Gouty arthritis.  4.  Degenerative joint disease of lumbar spine.  5.  Hyperlipidemia.  6.  Hospitalization for bilateral staging pelvic lymphadenectomy and      retropubic radical prostatectomy on February 19, 1991, secondary to stage      BI adenocarcinoma of the prostate.  7.  Repair of umbilical hernia at age 33.  8.  Rheumatic fever at age 18.  30.  Right inguinal hernia repair in his 76s in [place].   ALLERGIES:  No known drug allergies.   HABITS:  Former use of cigarettes x40 years, and positive for current social  use of alcohol.   FAMILY HISTORY:  Mother deceased at age 44 secondary to cancer;  father  deceased at age 50, cause unknown; 2 sisters deceased age 29 secondary to  congestive heart failure, and 34 secondary to congestive heart failure;  2  brothers deceased at age 42 secondary to complications of alcohol abuse, and  age 68, cause unknown;  4 daughters living, age 58-1/2, age 1, good health,  and age 71, history of diabetes mellitus in age 72, good health.   PHYSICAL EXAMINATION:  GENERAL:  General  malaise secondary to multifactorial  etiology (influenza) and chronic anemia (the patient was a healthy, slightly  medium framed medium height alert black male in no apparent respiratory  distress.  HEENT:  Nose was negative for discharge.  Oropharynx was benign.  LUNGS:  Clear to auscultation.  HEART:  Audible S1 and S2 without murmur.  Regular rate and rhythm.  ABDOMEN:  Slightly obese and positive healed inferior umbilical surgical  scar.  Abdomen was soft and nontender in all four quadrants.  Examination  demonstrated no palpable masses or organomegaly.  EXTREMITIES:  No edema, no joint swelling, no joint redness, no joint  hardness.  NEUROLOGIC:  Intact.   ADMISSION LABORATORY DATA:  White count 7, hemoglobin 9.7, hematocrit 28.6,  platelets 208,000.  Sodium 135, potassium 3.4, chloride 101, CO2 of 25,  glucose 123, BUN 22, creatinine 1.5, total bilirubin 1.6, alkaline  phosphatase 54, SGOT 19, SGPT 12, total protein 7.3, albumin 3.7, calcium  9.6.  Total CPK 169, CK-MB 2.2, troponin-I of 0.03.  ABGs on room air showed  a pH of 7.44, PCO2 of 31.6, PO2 of 78.4, O2 saturation of 95.2%.   HOSPITAL COURSE:  #1 -  The patient was treated with IV fluids, admitted to  telemetry, ordering of a chest x-ray, and other supportive measures.  The  patient experienced a temperature of 102.2 on the night of admission.  He  was given Tylenol for his temperature.  Chest x-ray on admission was read as  no acute cardiopulmonary findings and remote left thoracic trauma with old  rib fracture and pleural thickening.  The patient, however, continued to  experienced a spiking fever during the first part of the hospitalization.  Therefore, he was started on IV antibiotics, __________ 100 mg p.o. b.i.d.  on February 12, 2005, Alupent/Atrovent nebulizers q.6h., ordering of blood  cultures, ordering of influenza A and B titers, erythrocyte sedimentation  rate, C-reactive protein, D-dimer, and other supportive  measures.  Findings  from repeat laboratory work on February 11, 2005 were as follows:  White count  9.9, hemoglobin 12, hematocrit 35.3.  Sodium 130, potassium 3.3, chloride  98, CO2 of 26, BUN 11, creatinine 1.3.  C-reactive protein 21 (normal less  than 0.6 mg/dL).  Influenza titer revealed the following during this  hospitalization:  Influenza A antibody IgG 1.54 (reference range 1.11  __________  graded positive).  Influenza B antibody IgG 1.14 (1.1 __________  graded positive).  It was felt that the patient probably experienced acute  viral syndrome in addition to his anemia.  A D-dimer value was 2.91 (normal  0 to 0.48).  A CT scan of the chest was ordered on February 11, 2005, and  revealed a pneumonia in the right upper, middle, and lower lobe.  There was  an associated parapneumonic effusion, mild cardiomegaly.  A CT scan of the  abdomen on February 11, 2005, demonstrated the following:  Borderline mild  hepatomegaly, diffuse fatty infiltration of the liver, multiple low  attenuation foci in the liver which were likely cyst, given their  conspicuity for their small size, no acute abnormalities otherwise in the  abdomen, small hiatal hernia.  CT scan of the pelvis demonstrated no acute  abnormality in the pelvis, status post prostatectomy and left inguinal  hernia containing fat, as read by Dr. Hulan Saas.  Repeat chest x-ray  during this hospitalization on February 14, 2005, was read as faint air space  disease noted in the axillary region of the right upper lobe.  There was  minimal hazy density projecting over the right hemidiaphragm.  Findings were  compatible with some minimal pneumonia.  The findings were much more  impressive on the CT scan, but slightly more prominent when compared to the  prior chest x-ray.  The left lung remained clear as read by Dr. Elly Modena.  The patient improved secondary to treatment with antibiotics and antiviral medications, antitussive medications,  and IV fluids.  He did not  spike any fever during the last days of hospitalization.  He was alert and  oriented to person, place, and time.  Appetite had improved.  Strength was  significantly improved.  The patient was ambulating in the hall without  complaining of chest pain, shortness of breath, or frequent cough, etc.  He  was discharged to his home.  Home health will monitor his blood pressure,  blood sugar, and respiratory status.  The patient was discharged home on  February 17, 2005.  White count on February 16, 2005, was 5.2,  hemoglobin 11.7,  hematocrit 34.3.  Lung fields demonstrated good air movement in both air  fields.  The patient had no significant crackles or wheezes at time of  discharge.  In workup of his anemia, the patient had a diagnostic  colonoscopy by Dr. Maggie Schwalbe because of guaiac positive stool on rectal  examination.  Rectal examination demonstrated normal sphincter tone, no  external hemorrhoids, no internal hemorrhoids, and no palpable masses.  Colonoscopy was done on February 09, 2005, revealing abnormality in the cecum.  The vessels were AV malformation and were controlled with heated probe  coagulation.  A few small scattered diverticula were seen in the cecum.  Colonoscopy was otherwise normal.  Procedure was performed without  complication.  Anemia panel during this hospitalization revealed the  following:  Iron 112 mcg/dL (normal 42 to 086), total iron binding capacity  of 442 mcg/dL(normal 578 to 469), iron saturation 25% (normal 20 to 55), B12  292 pg/mL, serum folate 18.9 ng/mL, and ferritin 8 ng/mL.  The patient was  given 2 units of type and cross red blood cells on February 10, 2005, because  of decreasing hemoglobin to 8.8, hematocrit 26.6, compared to admission.  Repeat hemoglobin on February 11, 2005, was 12, hematocrit was 35.3.  Last  hemoglobin was 11.7, hematocrit 34.3, on February 16, 2005.  The patient was  also on Pepcid during this hospitalization.   Abdominal examination was  essentially benign.  It is concluded that the patient malaise and weakness  was multifactorial etiology involving influenza infection, acute right  pneumonia, and anemia.   #2 -  TYPE 2 DIABETES MELLITUS:  Serum glucose on admission was 123.  The  patient's glucose began to increase on February 09, 2005.  He was treated with  Lantus insulin at night initially with Regular Insulin.  Hemoglobin A1C was  9.4.  The patient's history was poor complying daily to medications.  He was  also started on diabetic teaching pertaining to insulin.  Home health will  continue diabetic teaching, monitoring of blood sugar, teaching of how to  give insulin skin care, and other supportive measures.  Moreover, he was  visited by the dietitian during this hospitalization.  Last MET-7 was on  February 17, 2005, revealing the following:  Sodium 134, potassium 4, chloride  104, CO2 of 23, glucose 109, BUN 10, creatinine 1.3.  He was alert and oriented to person, place, and time.   #3 -  HYPERTENSION:  Blood pressure on admission was 157/69.  Heart  examination revealed audible S1 and S2 without murmur.  Rhythm was regular  and rate was within normal limits.  EKG was read as normal sinus rhythm, ST  and T-wave abnormalities, consider inferior lateral ischemia, abnormal  electrocardiogram.  The patient was referred to cardiology as an outpatient  because of his risk factors (age, hypertension, diabetes, and  hyperlipidemia).  He did not experience any chest pain, shortness of breath,  or palpitations during this hospitalization.  Serial cardiac enzymes  demonstrated the following during this hospitalization:  On February 07, 2005,  at 1710:  Total CPK 169, CK-MB 2.2, troponin-I 0.03;  on February 08, 2005, at  0019:  Total CPK 155, CK-MB 1.3, troponin-I of 0.03;  on February 08, 2005, at  [phone]:  Total CPK 140, CK-MB 1.3, troponin-I of 0.03.  Blood pressure was  treated with antihypertensive  medications.  Blood pressure on the morning of  discharge was 142/69, with a pulse of 84.  Examination  of extremities  demonstrated no edema throughout his hospitalization.  Telemetry  demonstrated no sinus arrhythmia or any significant rhythm compatible with  heart disease.   #4 -  HYPERLIPIDEMIA:  Lipid profile during this hospitalization  demonstrated the following:  Total cholesterol 190, triglycerides 111, HDL  45, LDL 123, on February 08, 2005, at 0530.  The patient was treated with a low  cholesterol diet and discharged home on Zocor 20 mg p.o. every day because  of his risk factors for heart disease.   #5 -  DEGENERATIVE JOINT DISEASE OF THE LUMBAR SPINE:  The patient did not  complain of any joint pain, joint swelling, or back pain during this  hospitalization.  He would be advised to take Tylenol as needed for back  pain q.6h. (500 mg).   DIET:  Carbohydrate, low sodium and cholesterol.   ACTIVITY:  No restrictions.   DISCHARGE MEDICATIONS:  1.  Norvasc 5 mg p.o. daily.  2.  Zyloprim 300 mg p.o. daily.  3.  Clorpres 0.1/15 mg one tablet daily.  4.  Famotidine 20 mg one tablet daily.  5.  Avelox 400 mg one tablet daily.  6.  Robitussin 2 teaspoons p.o. q.i.d.  7.  Glipizide 5 mg two tablets q.a.m. before breakfast.  8.  Lantus insulin 22 units at 10 p.m. if blood sugar greater than 149.  9.  Augmentin 875 mg one tablet b.i.d. with food.  10. Zocor 20 mg one tablet daily for cholesterol.  11. Accu-Cheks at 7 a.m., 4 p.m., and 10 p.m.   Blood culture was negative for growth.  Urine culture demonstrated  insignificant growth.   PRIMARY DIAGNOSES:  General malaise and weakness secondary to multifactorial  etiology, acute influenza infection, acute right pneumonia, and chronic  anemia.   SECONDARY DIAGNOSIS:  1.  Small hiatal hernia.  2.  Left inguinal hernia.  3.  Degenerative joint disease of the lumbar spine.  4.  Hypertension.  5.  Hyperlipidemia. 6.  Type 2  diabetes mellitus.  7.  History of stage BI adenocarcinoma of the prostate.   PROCEDURE:  Colonoscopy by Dr. Maggie Schwalbe on February 09, 2005.      GKH/MEDQ  D:  02/18/2005  T:  02/19/2005  Job:  [id number]

## 2011-10-18 ENCOUNTER — Encounter: Payer: Self-pay | Admitting: *Deleted

## 2011-10-18 ENCOUNTER — Emergency Department (HOSPITAL_COMMUNITY)
Admission: EM | Admit: 2011-10-18 | Discharge: 2011-10-19 | Disposition: A | Discharge status: Home / Self Care | Payer: Medicare Other | Attending: Emergency Medicine | Admitting: Emergency Medicine

## 2011-10-18 ENCOUNTER — Emergency Department (HOSPITAL_COMMUNITY): Payer: Medicare Other

## 2011-10-18 DIAGNOSIS — T38801A Poisoning by unspecified hormones and synthetic substitutes, accidental (unintentional), initial encounter: Secondary | ICD-10-CM | POA: Insufficient documentation

## 2011-10-18 DIAGNOSIS — S0990XA Unspecified injury of head, initial encounter: Secondary | ICD-10-CM | POA: Insufficient documentation

## 2011-10-18 DIAGNOSIS — Z87891 Personal history of nicotine dependence: Secondary | ICD-10-CM | POA: Insufficient documentation

## 2011-10-18 DIAGNOSIS — E119 Type 2 diabetes mellitus without complications: Secondary | ICD-10-CM | POA: Insufficient documentation

## 2011-10-18 DIAGNOSIS — W010XXA Fall on same level from slipping, tripping and stumbling without subsequent striking against object, initial encounter: Secondary | ICD-10-CM | POA: Insufficient documentation

## 2011-10-18 DIAGNOSIS — I10 Essential (primary) hypertension: Secondary | ICD-10-CM | POA: Insufficient documentation

## 2011-10-18 DIAGNOSIS — Z8546 Personal history of malignant neoplasm of prostate: Secondary | ICD-10-CM | POA: Insufficient documentation

## 2011-10-18 DIAGNOSIS — R42 Dizziness and giddiness: Secondary | ICD-10-CM | POA: Insufficient documentation

## 2011-10-18 DIAGNOSIS — M129 Arthropathy, unspecified: Secondary | ICD-10-CM | POA: Insufficient documentation

## 2011-10-18 DIAGNOSIS — Z794 Long term (current) use of insulin: Secondary | ICD-10-CM | POA: Insufficient documentation

## 2011-10-18 DIAGNOSIS — Z7982 Long term (current) use of aspirin: Secondary | ICD-10-CM | POA: Insufficient documentation

## 2011-10-18 DIAGNOSIS — T383X1A Poisoning by insulin and oral hypoglycemic [antidiabetic] drugs, accidental (unintentional), initial encounter: Secondary | ICD-10-CM

## 2011-10-18 DIAGNOSIS — M109 Gout, unspecified: Secondary | ICD-10-CM | POA: Insufficient documentation

## 2011-10-18 HISTORY — DX: Unspecified osteoarthritis, unspecified site: M19.90

## 2011-10-18 HISTORY — DX: Essential (primary) hypertension: I10

## 2011-10-18 HISTORY — DX: Malignant neoplasm of prostate: C61

## 2011-10-18 LAB — URINALYSIS, ROUTINE W REFLEX MICROSCOPIC
Glucose, UA: NEGATIVE mg/dL
Hgb urine dipstick: NEGATIVE
Leukocytes, UA: NEGATIVE
Protein, ur: NEGATIVE mg/dL
Specific Gravity, Urine: 1.02 (ref 1.005–1.030)
Urobilinogen, UA: 0.2 mg/dL (ref 0.0–1.0)

## 2011-10-18 LAB — GLUCOSE, CAPILLARY
Glucose-Capillary: 161 mg/dL — ABNORMAL HIGH (ref 70–99)
Glucose-Capillary: 140 mg/dL — ABNORMAL HIGH (ref 70–99)
Glucose-Capillary: 126 mg/dL — ABNORMAL HIGH (ref 70–99)

## 2011-10-18 LAB — CBC
Hemoglobin: 10 g/dL — ABNORMAL LOW (ref 13.0–17.0)
Platelets: 208 10*3/uL (ref 150–400)
RBC: 3.31 MIL/uL — ABNORMAL LOW (ref 4.22–5.81)

## 2011-10-18 LAB — BASIC METABOLIC PANEL
CO2: 27 mEq/L (ref 19–32)
Calcium: 10.4 mg/dL (ref 8.4–10.5)
GFR calc non Af Amer: 51 mL/min — ABNORMAL LOW (ref >90)
Glucose, Bld: 128 mg/dL — ABNORMAL HIGH (ref 70–99)
Potassium: 3.4 mEq/L — ABNORMAL LOW (ref 3.5–5.1)
Sodium: 137 mEq/L (ref 135–145)

## 2011-10-18 NOTE — ED Provider Notes (Signed)
History     CSN: [id number] Arrival date & time: 10/18/2011  7:26 PM   First MD Initiated Contact with Patient 10/18/11 1927      Chief Complaint  Patient presents with  . Hyperglycemia    (Consider location/radiation/quality/duration/timing/severity/associated sxs/prior treatment) HPI Patient presents with concern for hyperglycemia. He states that earlier today his blood sugar was 234. It has been running in the high 100s over the past 4 days which has been concerning him. He normally takes 28 units of Lantus every morning. Today at approximately 6:30 PM he took a second dose of 24 units of Lantus due to being concerned about his high blood sugar. He does not use a sliding scale were any short acting insulin. He also states 2 days ago he tripped and fell hitting the left side of his head. He feels that he has been dizzy since the fall. There is no loss of consciousness or vomiting. There is no seizure activity. The fall was mechanical fall in which she tripped over something. He has had no fever or chills. He has had no vomiting. No cough or other systemic symptoms. Nothing helps to improve his symptoms. Nothing makes the symptoms worse.  Past Medical History  Diagnosis Date  . Diabetes mellitus   . Hypertension   . Arthritis   . Prostate cancer   . Gout     Past Surgical History  Procedure Date  . Prostate surgery   . Umbilical hernia repair     History reviewed. No pertinent family history.  History  Substance Use Topics  . Smoking status: Former Games developer  . Smokeless tobacco: Not on file  . Alcohol Use: No      Review of Systems ROS reviewed and otherwise negative except for mentioned in HPI  Allergies  Review of patient's allergies indicates no known allergies.  Home Medications   Current Outpatient Rx  Name Route Sig Dispense Refill  . ALLOPURINOL 100 MG PO TABS Oral Take 100 mg by mouth daily.      Marland Kitchen AMLODIPINE BESYLATE 5 MG PO TABS Oral Take 5 mg by mouth  daily.      . ASPIRIN EC 81 MG PO TBEC Oral Take 81 mg by mouth daily.      Marland Kitchen CLONIDINE-CHLORTHALIDONE 0.1-15 MG PO TABS Oral Take 1 tablet by mouth daily.      Marland Kitchen DOCUSATE SODIUM 100 MG PO CAPS Oral Take 100 mg by mouth daily as needed. For constipation     . LISINOPRIL 5 MG PO TABS Oral Take 5 mg by mouth daily.      Marland Kitchen OMEPRAZOLE 20 MG PO CPDR Oral Take 20 mg by mouth daily as needed. For GERD       BP 188/64  Pulse 70  Temp(Src) 97.7 F (36.5 C) (Oral)  Resp 16  Ht 5\' 6"  (1.676 m)  Wt 162 lb (73.483 kg)  BMI 26.15 kg/m2  SpO2 96% Vitals noted Physical Exam Physical Examination: General appearance - alert, well appearing, and in no distress Mental status - alert, oriented to person, place, and time Eyes - pupils equal and reactive, extraocular eye movements intact Ears - no hemotympanum Face- no bony point tenderness, no maloclusion of jaw Mouth - mucous membranes moist, pharynx normal without lesions Neck - supple, no midline tenderness to palpation, FROM Chest - clear to auscultation, no wheezes, rales or rhonchi, symmetric air entry Heart - normal rate, regular rhythm, normal S1, S2, no murmurs, rubs, clicks or gallops Abdomen -  soft, nontender, nondistended, no masses or organomegaly Neurological - alert, oriented, normal speech, no focal findings or movement disorder noted, cranial nerves II through XII intact, motor and sensory grossly normal bilaterally Musculoskeletal - no joint tenderness, deformity or swelling Extremities - peripheral pulses normal, no pedal edema, no clubbing or cyanosis Skin - normal coloration and turgor, no rashes  ED Course  Procedures (including critical care time)  Labs Reviewed  GLUCOSE, CAPILLARY - Abnormal; Notable for the following:    Glucose-Capillary 161 (*)    All other components within normal limits  CBC - Abnormal; Notable for the following:    RBC 3.31 (*)    Hemoglobin 10.0 (*)    HCT 29.6 (*)    All other components within  normal limits  BASIC METABOLIC PANEL - Abnormal; Notable for the following:    Potassium 3.4 (*)    Glucose, Bld 128 (*)    GFR calc non Af Amer 51 (*)    GFR calc Af Amer 59 (*)    All other components within normal limits  GLUCOSE, CAPILLARY - Abnormal; Notable for the following:    Glucose-Capillary 140 (*)    All other components within normal limits  URINALYSIS, ROUTINE W REFLEX MICROSCOPIC  POCT CBG MONITORING  POCT CBG MONITORING   Ct Head Wo Contrast  10/18/2011  *RADIOLOGY REPORT*  Clinical Data: Dizziness.  Status post fall two nights ago.  CT HEAD WITHOUT CONTRAST  Technique:  Contiguous axial images were obtained from the base of the skull through the vertex without contrast.  Comparison: Head CT 06/10/2009.  Findings: There is cortical atrophy with some chronic microvascular ischemic change.  No evidence of acute intracranial abnormality including acute infarction, hemorrhage, mass lesion, mass effect, midline shift or abnormal extra-axial fluid collection.  No hydrocephalus or pneumocephalus.  Calvarium intact.  IMPRESSION: No acute finding.  Original Report Authenticated By: Bernadene Bell. Maricela Curet, M.D.     No diagnosis found. 9:22 PM  D/w patient and family member his results thus far.  CBG has dropped from 160 to 120s over 2 hours.  Took Lantus- second dose- at approx 6:30pm.  D/w him that we will need to observe him at least 6 hours.  Providing meal tray now.  Pt states he is asymptomatic at this time.   9:52 PM  Pt signed out to Dr. Juleen China- repeat CBG ordered    MDM  Patient presenting with concern for elevated blood sugar over the past 4 days. Also noting a mechanical fall 2 days ago in which he struck his head. Head CT was normal. He has a normal neurologic exam. His blood sugar was only mildly elevated at 160 upon arrival. However in taking patient's history he notes that he took a second dose of Lantus insulin 24 units tonight at 6:30 PM. His blood sugar will need to be  monitored for at least 6 hours post Lantus injection. He is eating a meal here in the ED. His renal function is normal. He is able to maintain his blood sugar past 6 hours as suspect he can be discharged home safely.        Ethelda Chick, MD 10/18/11 2152

## 2011-10-18 NOTE — ED Notes (Signed)
C/o elevated blood sugar for a week, pt states that he fell on Monday, pt has bruising noted to left side of his face, pt states that he has been dizzy since his blood sugar has been acting up.

## 2011-10-18 NOTE — ED Notes (Signed)
CBG obtained with reading of 161 in Triage aread.

## 2011-10-18 NOTE — ED Notes (Signed)
Pt states that he did take 24 units of lantus at 17:45 tonight because he blood sugar was 274 at that time,

## 2011-10-19 NOTE — ED Provider Notes (Signed)
Pt has continued to be observed in ED. Glucose stabilized. Has not had hypoglycemic episode. Pt has eaten. Pt actually asked if he could eat something when he got home and was encouraged to do so as well. Pt with no complaints prior to DC. Discussed need to take medications as prescribed. Pt leaving with friend.     Raeford Razor, MD 10/19/11 [phone]

## 2012-01-08 ENCOUNTER — Encounter (HOSPITAL_COMMUNITY): Payer: Self-pay | Admitting: *Deleted

## 2012-01-08 ENCOUNTER — Other Ambulatory Visit: Payer: Self-pay

## 2012-01-08 ENCOUNTER — Emergency Department (HOSPITAL_COMMUNITY)
Admission: EM | Admit: 2012-01-08 | Discharge: 2012-01-09 | Disposition: A | Discharge status: Home / Self Care | Payer: Medicare Other | Attending: Emergency Medicine | Admitting: Emergency Medicine

## 2012-01-08 DIAGNOSIS — E119 Type 2 diabetes mellitus without complications: Secondary | ICD-10-CM | POA: Insufficient documentation

## 2012-01-08 DIAGNOSIS — R0602 Shortness of breath: Secondary | ICD-10-CM | POA: Insufficient documentation

## 2012-01-08 DIAGNOSIS — Z8546 Personal history of malignant neoplasm of prostate: Secondary | ICD-10-CM | POA: Insufficient documentation

## 2012-01-08 DIAGNOSIS — I10 Essential (primary) hypertension: Secondary | ICD-10-CM | POA: Insufficient documentation

## 2012-01-08 DIAGNOSIS — R5381 Other malaise: Secondary | ICD-10-CM | POA: Insufficient documentation

## 2012-01-08 DIAGNOSIS — D649 Anemia, unspecified: Secondary | ICD-10-CM | POA: Insufficient documentation

## 2012-01-08 DIAGNOSIS — R011 Cardiac murmur, unspecified: Secondary | ICD-10-CM | POA: Insufficient documentation

## 2012-01-08 DIAGNOSIS — M109 Gout, unspecified: Secondary | ICD-10-CM | POA: Insufficient documentation

## 2012-01-08 DIAGNOSIS — Z7982 Long term (current) use of aspirin: Secondary | ICD-10-CM | POA: Insufficient documentation

## 2012-01-08 LAB — COMPREHENSIVE METABOLIC PANEL
ALT: 9 U/L (ref 0–53)
AST: 14 U/L (ref 0–37)
Albumin: 3.5 g/dL (ref 3.5–5.2)
CO2: 24 mEq/L (ref 19–32)
Calcium: 10.5 mg/dL (ref 8.4–10.5)
Chloride: 103 mEq/L (ref 96–112)
GFR calc non Af Amer: 45 mL/min — ABNORMAL LOW (ref >90)
Sodium: 139 mEq/L (ref 135–145)
Total Bilirubin: 0.2 mg/dL — ABNORMAL LOW (ref 0.3–1.2)

## 2012-01-08 LAB — CBC
HCT: 23.8 % — ABNORMAL LOW (ref 39.0–52.0)
Hemoglobin: 7.4 g/dL — ABNORMAL LOW (ref 13.0–17.0)
RBC: 2.84 MIL/uL — ABNORMAL LOW (ref 4.22–5.81)
WBC: 4.6 10*3/uL (ref 4.0–10.5)

## 2012-01-08 LAB — IRON AND TIBC
Iron: 76 ug/dL (ref 42–135)
Saturation Ratios: 18 % — ABNORMAL LOW (ref 20–55)
UIBC: 338 ug/dL (ref 125–400)

## 2012-01-08 LAB — DIFFERENTIAL
Basophils Relative: 1 % (ref 0–1)
Eosinophils Relative: 7 % — ABNORMAL HIGH (ref 0–5)
Lymphocytes Relative: 27 % (ref 12–46)
Monocytes Relative: 10 % (ref 3–12)
Neutro Abs: 2.6 10*3/uL (ref 1.7–7.7)

## 2012-01-08 LAB — FOLATE: Folate: 9.3 ng/mL

## 2012-01-08 LAB — FERRITIN: Ferritin: 8 ng/mL — ABNORMAL LOW (ref 22–322)

## 2012-01-08 LAB — PREPARE RBC (CROSSMATCH)

## 2012-01-08 LAB — RETICULOCYTES: Retic Ct Pct: 2.3 % (ref 0.4–3.1)

## 2012-01-08 LAB — PROTIME-INR: INR: 1.05 (ref 0.00–1.49)

## 2012-01-08 LAB — TROPONIN I: Troponin I: <0.3 ng/mL (ref <0.30)

## 2012-01-08 NOTE — ED Notes (Signed)
Family here with pt reports pt has been "weak since Saturday" seen by PCP today and sent over for evaluation. Pt a/o x3. resp even/nonlabored. nad noted.

## 2012-01-08 NOTE — ED Provider Notes (Signed)
History    This chart was scribed for [employer]. Colon Branch, MD, MD by Smitty Pluck. The patient was seen in room APA06 and the patient's care was started at 4:00PM.   CSN: [id number]  Arrival date & time 01/08/12  1452   First MD Initiated Contact with Patient 01/08/12 1545      Chief Complaint  Patient presents with  . Weakness    (Consider location/radiation/quality/duration/timing/severity/associated sxs/prior treatment) The history is provided by the patient.   [name] is a 78 y.o. male who presents to the Emergency Department complaining of progressive generalized weakness onset 5 days ago. Pt has also has had SOB. Denies fever, chills, nausea and diarrhea. Pt reports having history of chronic anemic. He has been on iron pills for several years. He denies bleeding. Pt went to see PCP today and received labs reporting that his hemoglobin level was low and was instructed to come to ED for transfusion.  PCP is Dr. Loleta Chance.   Past Medical History  Diagnosis Date  . Diabetes mellitus   . Hypertension   . Arthritis   . Prostate cancer   . Gout     Past Surgical History  Procedure Date  . Prostate surgery   . Umbilical hernia repair     No family history on file.  History  Substance Use Topics  . Smoking status: Former Games developer  . Smokeless tobacco: Not on file  . Alcohol Use: No      Review of Systems  All other systems reviewed and are negative.   10 Systems reviewed and are negative for acute change except as noted in the HPI.  Allergies  Review of patient's allergies indicates no known allergies.  Home Medications   Current Outpatient Rx  Name Route Sig Dispense Refill  . ALLOPURINOL 100 MG PO TABS Oral Take 100 mg by mouth daily.      Marland Kitchen AMLODIPINE BESYLATE 5 MG PO TABS Oral Take 5 mg by mouth daily.      . ASPIRIN EC 81 MG PO TBEC Oral Take 81 mg by mouth daily.      Marland Kitchen CLONIDINE-CHLORTHALIDONE 0.1-15 MG PO TABS Oral Take 1 tablet by mouth daily.      Marland Kitchen  DOCUSATE SODIUM 100 MG PO CAPS Oral Take 100 mg by mouth daily as needed. For constipation     . LISINOPRIL 5 MG PO TABS Oral Take 5 mg by mouth daily.      Marland Kitchen OMEPRAZOLE 20 MG PO CPDR Oral Take 20 mg by mouth daily as needed. For GERD       BP 149/51  Pulse 75  Temp(Src) 97.9 F (36.6 C) (Oral)  Resp 18  Ht 5\' 5"  (1.651 m)  Wt 157 lb (71.215 kg)  BMI 26.13 kg/m2  SpO2 99%  Physical Exam  Nursing note and vitals reviewed. Constitutional: He is oriented to person, place, and time. He appears well-developed and well-nourished. No distress.  HENT:  Head: Normocephalic and atraumatic.  Neck: Normal range of motion.  Cardiovascular: Normal rate and regular rhythm.   Murmur (slight systolic ) heard. Pulmonary/Chest: Effort normal and breath sounds normal. No respiratory distress.  Abdominal: Soft. Bowel sounds are normal. He exhibits no distension. There is no tenderness.  Neurological: He is alert and oriented to person, place, and time.  Skin: Skin is warm and dry.  Psychiatric: He has a normal mood and affect. His behavior is normal.    ED Course  Procedures (including critical care time)  DIAGNOSTIC STUDIES: Oxygen Saturation is 99% on room air, normal by my interpretation.    COORDINATION OF CARE:  4:13PM Consults Hospitalist Dr. Wenda Overland  and is instructed to have blood panel and smear. He will be referred to specialty clinic, Dr. Mariel Sleet.  4:17PM Consult to [facility]ty [facility], Dr. Mariel Sleet (hematologist) to schedule pt appointment for blood transfusion (2/12) and appointment for follow up after transfusion (2/18).     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 CBC NO Diff  Normal Abnormal  Limits  Class/Lab  WBC 6.1  4.0-10.5 K/uL SLN  RBC  3.12 L 4.22 - 5.81 MIL /uL SLN  Hemoglobin   8.2  L 13.0 - 17.0 g/L SLN  Hematocrit  27.0 39.0 - 52.0 SLN  MCV 86.5  78.0-100 fL SLN  MCH 26.3  26.0-34.0 pg SLN  MCHC 30.4  30.0-36.0 g/dL SLN  RDW 81.1  91.4-78.2 % SLN    Platelet Count  345  150-400 K/uL SLN   Basic Metabolic Panel  Normal Abnormal Limits Class/ Lab  Sodium 137  135-145 mEq/ L SLN  Potassium 4.1  3.5-5.3 mEq/ L SLN  Chloride 103  96-112 mEq/ L SLN  CO2 23  19-32 mEq/ L SLN  Glucose  247 H 70- 99 mg/dL SLN  BUN  27 H 9-56 mg/dL SLN  Creatinine 2.13  0.86-5.78 mg/ dL SLN  Calcium  46.9  6.2-95.2 mg/ dL SLN    Date: 84/13/2440  1559  Rate: 73  Rhythm: normal sinus rhythm  QRS Axis: normal  Intervals: normal  ST/T Wave abnormalities: nonspecific ST/T changes  Conduction Disutrbances:none  Narrative Interpretation: consider inferior and anterolateral ischemia.  Old EKG Reviewed: none available  Results for orders placed during the hospital encounter of 01/08/12  PROTIME-INR      Component Value Range   Prothrombin Time 13.9  11.6 - 15.2 (seconds)   INR 1.05  0.00 - 1.49   APTT      Component Value Range   aPTT 36  24 - 37 (seconds)  VITAMIN B12      Component Value Range   Vitamin B-12 170 (*) 211 - 911 (pg/mL)  FOLATE      Component Value Range   Folate 9.3    IRON AND TIBC      Component Value Range   Iron 76  42 - 135 (ug/dL)   TIBC 102  725 - 366 (ug/dL)   Saturation Ratios 18 (*) 20 - 55 (%)   UIBC 338  125 - 400 (ug/dL)  FERRITIN      Component Value Range   Ferritin 8 (*) 22 - 322 (ng/mL)  RETICULOCYTES      Component Value Range   Retic Ct Pct 2.3  0.4 - 3.1 (%)   RBC. 2.84 (*) 4.22 - 5.81 (MIL/uL)   Retic Count, Manual 65.3  19.0 - 186.0 (K/uL)  TYPE AND SCREEN      Component Value Range   ABO/RH(D) O POS     Antibody Screen NEG     Sample Expiration 01/11/2012     Unit Number [id number]     Blood Component Type RBC LR PHER2     Unit division 00     Status of Unit ISSUED     Transfusion Status OK TO TRANSFUSE     Crossmatch Result Compatible     Unit Number [id number]     Blood Component Type RBC LR PHER1     Unit division 00  Status of Unit ISSUED     Transfusion Status OK TO TRANSFUSE      Crossmatch Result Compatible    PREPARE RBC (CROSSMATCH)      Component Value Range   Order Confirmation ORDER PROCESSED BY BLOOD BANK    CBC      Component Value Range   WBC 4.6  4.0 - 10.5 (K/uL)   RBC 2.84 (*) 4.22 - 5.81 (MIL/uL)   Hemoglobin 7.4 (*) 13.0 - 17.0 (g/dL)   HCT 16.1 (*) 09.6 - 52.0 (%)   MCV 83.8  78.0 - 100.0 (fL)   MCH 26.1  26.0 - 34.0 (pg)   MCHC 31.1  30.0 - 36.0 (g/dL)   RDW 04.5  40.9 - 81.1 (%)   Platelets 276  150 - 400 (K/uL)  DIFFERENTIAL      Component Value Range   Neutrophils Relative 55  43 - 77 (%)   Lymphocytes Relative 27  12 - 46 (%)   Monocytes Relative 10  3 - 12 (%)   Eosinophils Relative 7 (*) 0 - 5 (%)   Basophils Relative 1  0 - 1 (%)   Neutro Abs 2.6  1.7 - 7.7 (K/uL)   Lymphs Abs 1.2  0.7 - 4.0 (K/uL)   Monocytes Absolute 0.5  0.1 - 1.0 (K/uL)   Eosinophils Absolute 0.3  0.0 - 0.7 (K/uL)   Basophils Absolute 0.0  0.0 - 0.1 (K/uL)   RBC Morphology POLYCHROMASIA PRESENT     Smear Review LARGE PLATELETS PRESENT    TROPONIN I      Component Value Range   Troponin I <0.30  <0.30 (ng/mL)  COMPREHENSIVE METABOLIC PANEL      Component Value Range   Sodium 139  135 - 145 (mEq/L)   Potassium 3.4 (*) 3.5 - 5.1 (mEq/L)   Chloride 103  96 - 112 (mEq/L)   CO2 24  19 - 32 (mEq/L)   Glucose, Bld 133 (*) 70 - 99 (mg/dL)   BUN 18  6 - 23 (mg/dL)   Creatinine, Ser 9.14  0.50 - 1.35 (mg/dL)   Calcium 78.2  8.4 - 10.5 (mg/dL)   Total Protein 7.6  6.0 - 8.3 (g/dL)   Albumin 3.5  3.5 - 5.2 (g/dL)   AST 14  0 - 37 (U/L)   ALT 9  0 - 53 (U/L)   Alkaline Phosphatase 75  39 - 117 (U/L)   Total Bilirubin 0.2 (*) 0.3 - 1.2 (mg/dL)   GFR calc non Af Amer 45 (*) >90 (mL/min)   GFR calc Af Amer 52 (*) >90 (mL/min)  ABO/RH      Component Value Range   ABO/RH(D) O POS      MDM  Patient with h/o chronic anemia now becoming symptomatic. Weakness and shortness of breath with exertion. Seen by PCP with referral for transfusion. Coordinated with [facility] for transfusion tomorrow and referral for work up with Dr. Mariel Sleet, hematologist for 01/15/12.Patient / Family understand and agree with initial ED impression and plan with expectations set for ED visit.Pt stable in ED with no significant deterioration in condition.The patient appears reasonably screened and/or stabilized for discharge and I doubt any other medical condition or other [facility] requiring further screening, evaluation, or treatment in the ED at this time prior to discharge.  I personally performed the services described in this documentation, which was scribed in my presence. The recorded information has been reviewed and considered.  MDM Reviewed: previous chart, nursing note  and vitals Reviewed previous: labs Interpretation: labs, ECG and x-ray Consults: blood bank, specialty clinic.         Nicoletta Dress. Colon Branch, MD 01/09/12 [phone]

## 2012-01-08 NOTE — ED Notes (Signed)
Pt sent from PCP with paperwork. Lab work of Feb 2nd show Hgb 8.2

## 2012-01-09 ENCOUNTER — Encounter (HOSPITAL_COMMUNITY): Payer: Medicare Other | Attending: Emergency Medicine

## 2012-01-09 VITALS — BP 133/62 | HR 60 | Temp 97.6°F | Resp 18

## 2012-01-09 DIAGNOSIS — D649 Anemia, unspecified: Secondary | ICD-10-CM

## 2012-01-09 MED ORDER — SODIUM CHLORIDE 0.9 % IV SOLN
Freq: Once | INTRAVENOUS | Status: AC
  Administered 2012-01-09: 10:00:00 via INTRAVENOUS

## 2012-01-09 NOTE — Progress Notes (Signed)
Tolerated blood transfusion with out problems. Notified of appt with T. Kefalas on 01/15/12

## 2012-01-10 LAB — TYPE AND SCREEN
ABO/RH(D): O POS
Unit division: 0
Unit division: 0

## 2012-01-15 ENCOUNTER — Ambulatory Visit (HOSPITAL_COMMUNITY): Payer: Medicare Other | Admitting: Oncology

## 2012-08-04 ENCOUNTER — Encounter (HOSPITAL_COMMUNITY): Payer: Self-pay | Admitting: Emergency Medicine

## 2012-08-04 ENCOUNTER — Inpatient Hospital Stay (HOSPITAL_COMMUNITY)
Admission: EM | Admit: 2012-08-04 | Discharge: 2012-08-08 | DRG: 871 | Disposition: A | Discharge status: Skilled Nursing Facility | Payer: Medicare Other | Attending: Internal Medicine | Admitting: Internal Medicine

## 2012-08-04 ENCOUNTER — Emergency Department (HOSPITAL_COMMUNITY): Payer: Medicare Other

## 2012-08-04 DIAGNOSIS — E876 Hypokalemia: Secondary | ICD-10-CM | POA: Diagnosis present

## 2012-08-04 DIAGNOSIS — I10 Essential (primary) hypertension: Secondary | ICD-10-CM

## 2012-08-04 DIAGNOSIS — E119 Type 2 diabetes mellitus without complications: Secondary | ICD-10-CM | POA: Diagnosis present

## 2012-08-04 DIAGNOSIS — Z23 Encounter for immunization: Secondary | ICD-10-CM

## 2012-08-04 DIAGNOSIS — Z8546 Personal history of malignant neoplasm of prostate: Secondary | ICD-10-CM

## 2012-08-04 DIAGNOSIS — I214 Non-ST elevation (NSTEMI) myocardial infarction: Secondary | ICD-10-CM

## 2012-08-04 DIAGNOSIS — R7989 Other specified abnormal findings of blood chemistry: Secondary | ICD-10-CM

## 2012-08-04 DIAGNOSIS — A4151 Sepsis due to Escherichia coli [E. coli]: Principal | ICD-10-CM | POA: Diagnosis present

## 2012-08-04 DIAGNOSIS — Z87891 Personal history of nicotine dependence: Secondary | ICD-10-CM

## 2012-08-04 DIAGNOSIS — I2489 Other forms of acute ischemic heart disease: Secondary | ICD-10-CM | POA: Diagnosis present

## 2012-08-04 DIAGNOSIS — I359 Nonrheumatic aortic valve disorder, unspecified: Secondary | ICD-10-CM | POA: Diagnosis present

## 2012-08-04 DIAGNOSIS — Z79899 Other long term (current) drug therapy: Secondary | ICD-10-CM

## 2012-08-04 DIAGNOSIS — M109 Gout, unspecified: Secondary | ICD-10-CM | POA: Diagnosis present

## 2012-08-04 DIAGNOSIS — E860 Dehydration: Secondary | ICD-10-CM | POA: Diagnosis present

## 2012-08-04 DIAGNOSIS — A419 Sepsis, unspecified organism: Secondary | ICD-10-CM

## 2012-08-04 DIAGNOSIS — N289 Disorder of kidney and ureter, unspecified: Secondary | ICD-10-CM

## 2012-08-04 DIAGNOSIS — N179 Acute kidney failure, unspecified: Secondary | ICD-10-CM

## 2012-08-04 DIAGNOSIS — R509 Fever, unspecified: Secondary | ICD-10-CM

## 2012-08-04 DIAGNOSIS — Z7982 Long term (current) use of aspirin: Secondary | ICD-10-CM

## 2012-08-04 DIAGNOSIS — D649 Anemia, unspecified: Secondary | ICD-10-CM

## 2012-08-04 DIAGNOSIS — E871 Hypo-osmolality and hyponatremia: Secondary | ICD-10-CM

## 2012-08-04 DIAGNOSIS — I350 Nonrheumatic aortic (valve) stenosis: Secondary | ICD-10-CM

## 2012-08-04 DIAGNOSIS — D509 Iron deficiency anemia, unspecified: Secondary | ICD-10-CM | POA: Diagnosis present

## 2012-08-04 DIAGNOSIS — I248 Other forms of acute ischemic heart disease: Secondary | ICD-10-CM | POA: Diagnosis present

## 2012-08-04 DIAGNOSIS — M129 Arthropathy, unspecified: Secondary | ICD-10-CM | POA: Diagnosis present

## 2012-08-04 DIAGNOSIS — N390 Urinary tract infection, site not specified: Secondary | ICD-10-CM

## 2012-08-04 LAB — COMPREHENSIVE METABOLIC PANEL
ALT: 10 U/L (ref 0–53)
BUN: 40 mg/dL — ABNORMAL HIGH (ref 6–23)
CO2: 24 mEq/L (ref 19–32)
Calcium: 9.4 mg/dL (ref 8.4–10.5)
Creatinine, Ser: 2.02 mg/dL — ABNORMAL HIGH (ref 0.50–1.35)
GFR calc Af Amer: 32 mL/min — ABNORMAL LOW (ref >90)
GFR calc non Af Amer: 27 mL/min — ABNORMAL LOW (ref >90)
Glucose, Bld: 204 mg/dL — ABNORMAL HIGH (ref 70–99)
Sodium: 129 mEq/L — ABNORMAL LOW (ref 135–145)
Total Protein: 6.6 g/dL (ref 6.0–8.3)

## 2012-08-04 LAB — CBC WITH DIFFERENTIAL/PLATELET
Eosinophils Absolute: 0 10*3/uL (ref 0.0–0.7)
Eosinophils Relative: 0 % (ref 0–5)
HCT: 26.3 % — ABNORMAL LOW (ref 39.0–52.0)
Lymphs Abs: 0.9 10*3/uL (ref 0.7–4.0)
MCH: 29.1 pg (ref 26.0–34.0)
MCV: 85.9 fL (ref 78.0–100.0)
Monocytes Absolute: 0.9 10*3/uL (ref 0.1–1.0)
Platelets: 174 10*3/uL (ref 150–400)
RBC: 3.06 MIL/uL — ABNORMAL LOW (ref 4.22–5.81)

## 2012-08-04 LAB — URINALYSIS, ROUTINE W REFLEX MICROSCOPIC
Bilirubin Urine: NEGATIVE
Ketones, ur: NEGATIVE mg/dL
Nitrite: NEGATIVE
Protein, ur: 100 mg/dL — AB
Urobilinogen, UA: 0.2 mg/dL (ref 0.0–1.0)

## 2012-08-04 LAB — MRSA PCR SCREENING: MRSA by PCR: NEGATIVE

## 2012-08-04 LAB — GLUCOSE, CAPILLARY: Glucose-Capillary: 145 mg/dL — ABNORMAL HIGH (ref 70–99)

## 2012-08-04 LAB — URINE MICROSCOPIC-ADD ON

## 2012-08-04 LAB — TROPONIN I: Troponin I: 1.24 ng/mL (ref <0.30)

## 2012-08-04 LAB — LIPASE, BLOOD: Lipase: 36 U/L (ref 11–59)

## 2012-08-04 LAB — LACTIC ACID, PLASMA: Lactic Acid, Venous: 1.3 mmol/L (ref 0.5–2.2)

## 2012-08-04 LAB — PROCALCITONIN: Procalcitonin: 21.5 ng/mL

## 2012-08-04 MED ORDER — POLYSACCHARIDE IRON COMPLEX 150 MG PO CAPS
150.0000 mg | ORAL_CAPSULE | Freq: Every day | ORAL | Status: DC
  Administered 2012-08-04 – 2012-08-08 (×5): 150 mg via ORAL
  Filled 2012-08-04 (×5): qty 1

## 2012-08-04 MED ORDER — ACETAMINOPHEN 650 MG RE SUPP: 650.0000 mg | Freq: Four times a day (QID) | RECTAL | Status: DC | PRN

## 2012-08-04 MED ORDER — INSULIN GLARGINE 100 UNIT/ML ~~LOC~~ SOLN
20.0000 [IU] | Freq: Every morning | SUBCUTANEOUS | Status: DC
  Administered 2012-08-05 – 2012-08-08 (×4): 20 [IU] via SUBCUTANEOUS

## 2012-08-04 MED ORDER — ENOXAPARIN SODIUM 30 MG/0.3ML ~~LOC~~ SOLN
30.0000 mg | SUBCUTANEOUS | Status: DC
  Administered 2012-08-04 – 2012-08-06 (×3): 30 mg via SUBCUTANEOUS
  Filled 2012-08-04 (×3): qty 0.3

## 2012-08-04 MED ORDER — ONDANSETRON HCL 4 MG PO TABS: 4.0000 mg | ORAL_TABLET | Freq: Four times a day (QID) | ORAL | Status: DC | PRN

## 2012-08-04 MED ORDER — ALLOPURINOL 100 MG PO TABS
100.0000 mg | ORAL_TABLET | Freq: Every day | ORAL | Status: DC
  Administered 2012-08-05 – 2012-08-08 (×4): 100 mg via ORAL
  Filled 2012-08-04 (×4): qty 1

## 2012-08-04 MED ORDER — INSULIN ASPART 100 UNIT/ML ~~LOC~~ SOLN
0.0000 [IU] | Freq: Three times a day (TID) | SUBCUTANEOUS | Status: DC
  Administered 2012-08-05: 3 [IU] via SUBCUTANEOUS
  Administered 2012-08-05: 2 [IU] via SUBCUTANEOUS
  Administered 2012-08-05: 8 [IU] via SUBCUTANEOUS
  Administered 2012-08-06: 3 [IU] via SUBCUTANEOUS
  Administered 2012-08-06: 5 [IU] via SUBCUTANEOUS
  Administered 2012-08-06: 3 [IU] via SUBCUTANEOUS
  Administered 2012-08-07: 2 [IU] via SUBCUTANEOUS
  Administered 2012-08-07 – 2012-08-08 (×2): 3 [IU] via SUBCUTANEOUS

## 2012-08-04 MED ORDER — ONDANSETRON HCL 4 MG/2ML IJ SOLN
4.0000 mg | Freq: Four times a day (QID) | INTRAMUSCULAR | Status: DC | PRN
  Administered 2012-08-07: 4 mg via INTRAVENOUS
  Filled 2012-08-04: qty 2

## 2012-08-04 MED ORDER — DEXTROSE 5 % IV SOLN
1.0000 g | INTRAVENOUS | Status: DC
  Administered 2012-08-05 – 2012-08-07 (×3): 1 g via INTRAVENOUS
  Filled 2012-08-04 (×4): qty 10

## 2012-08-04 MED ORDER — ASPIRIN EC 325 MG PO TBEC
325.0000 mg | DELAYED_RELEASE_TABLET | Freq: Every day | ORAL | Status: DC
  Administered 2012-08-05 – 2012-08-08 (×4): 325 mg via ORAL
  Filled 2012-08-04 (×4): qty 1

## 2012-08-04 MED ORDER — ASPIRIN 81 MG PO CHEW
324.0000 mg | CHEWABLE_TABLET | Freq: Once | ORAL | Status: AC
  Administered 2012-08-04: 324 mg via ORAL
  Filled 2012-08-04: qty 4

## 2012-08-04 MED ORDER — DEXTROSE 5 % IV SOLN
1.0000 g | Freq: Once | INTRAVENOUS | Status: AC
  Administered 2012-08-04: 1 g via INTRAVENOUS
  Filled 2012-08-04: qty 10

## 2012-08-04 MED ORDER — INSULIN ASPART 100 UNIT/ML ~~LOC~~ SOLN
0.0000 [IU] | Freq: Every day | SUBCUTANEOUS | Status: DC
  Administered 2012-08-04: 3 [IU] via SUBCUTANEOUS

## 2012-08-04 MED ORDER — ACETAMINOPHEN 325 MG PO TABS
650.0000 mg | ORAL_TABLET | Freq: Four times a day (QID) | ORAL | Status: DC | PRN
  Administered 2012-08-04 – 2012-08-07 (×5): 650 mg via ORAL
  Filled 2012-08-04 (×5): qty 2

## 2012-08-04 MED ORDER — POTASSIUM CHLORIDE IN NACL 20-0.9 MEQ/L-% IV SOLN
INTRAVENOUS | Status: DC
  Administered 2012-08-04 – 2012-08-05 (×2): via INTRAVENOUS
  Administered 2012-08-05: 1000 mL via INTRAVENOUS

## 2012-08-04 MED ORDER — ENOXAPARIN SODIUM 40 MG/0.4ML ~~LOC~~ SOLN: 40.0000 mg | SUBCUTANEOUS | Status: DC

## 2012-08-04 MED ORDER — SODIUM CHLORIDE 0.9 % IV SOLN
INTRAVENOUS | Status: DC
  Administered 2012-08-04: 14:00:00 via INTRAVENOUS

## 2012-08-04 MED ORDER — TETRAHYDROZOLINE HCL 0.05 % OP SOLN
1.0000 [drp] | Freq: Two times a day (BID) | OPHTHALMIC | Status: DC
  Filled 2012-08-04: qty 15

## 2012-08-04 NOTE — H&P (Signed)
[facility] History and Physical  [name] WJX:[id number] DOB: 1919-12-02 DOA: 08/04/2012  Referring physician: Dr. Clarene Duke PCP: Evlyn Courier, MD   Chief Complaint: fever, weakness  HPI: [name] is a 78 y.o. male  Who was brought to the emergency room with complaints of fever and generalized weakness. The history is obtained from the patient as well as his family friends at the bedside. The patient lives alone. He has 2 daughters who live out of state. Patient has been having worsening weakness over the past week or so. He's also had intermittent fevers. Denies any shortness of breath, cough, vomiting, hematuria or pyuria. He was having some constipation and took a stool softener/laxative. Since then he has had approximately 3 bowel movements per day for the past 2 days. He denies any melena or hematochezia. He's not had any chest pain or shortness of breath, no lightheadedness, no nausea. He was brought to the emergency room for evaluation where he was noted to be febrile with a temperature 102.5. Urine was indicative of a urinary tract infection. Chest x-ray did not show any signs of pneumonia. Troponin was found to be elevated at 1.2. EKG did not show any new changes. The patient has been referred for admission.  Review of Systems: The patient denies anorexia,  weight loss,, vision loss, hoarseness, chest pain, syncope, dyspnea on exertion, peripheral edema, balance deficits, hemoptysis, abdominal pain, melena, hematochezia, severe indigestion/heartburn, hematuria, incontinence, genital sores,  suspicious skin lesions, transient blindness,  depression, unusual weight change, abnormal bleeding, enlarged lymph nodes, angioedema, and breast masses.    Past Medical History  Diagnosis Date  . Diabetes mellitus   . Hypertension   . Arthritis   . Prostate cancer   . Gout    Past Surgical History  Procedure Date  . Prostate surgery   . Umbilical hernia repair    Social  History:  reports that he has quit smoking. He does not have any smokeless tobacco history on file. He reports that he does not drink alcohol or use illicit drugs. The patient currently lives at home. His caregivers report that he is having trouble walking around his house. They have told him to use a walker but he has declined.  No Known Allergies  Family history: Reviewed. No family history of premature coronary disease. He reports that the majority of his family members have lived long and healthy lives well into their 41s and 100s  Prior to Admission medications   Medication Sig Start Date End Date Taking? Authorizing Provider  allopurinol (ZYLOPRIM) 100 MG tablet Take 100 mg by mouth daily.     Yes Historical Provider, MD  amLODipine (NORVASC) 5 MG tablet Take 5 mg by mouth daily.     Yes Historical Provider, MD  aspirin EC 81 MG tablet Take 81 mg by mouth daily.     Yes Historical Provider, MD  cloNIDine-chlorthalidone (CLORPRES) 0.1-15 MG per tablet Take 1 tablet by mouth daily.     Yes Historical Provider, MD  lisinopril (PRINIVIL,ZESTRIL) 5 MG tablet Take 5 mg by mouth daily.     Yes Historical Provider, MD  Multiple Vitamins-Minerals (CENTRUM SILVER ADULT 50+) TABS Take 1 tablet by mouth daily.   Yes Historical Provider, MD  polysaccharide iron (NIFEREX) 150 MG CAPS capsule Take 150 mg by mouth daily.   Yes Historical Provider, MD  Tetrahydrozoline HCl (EYE DROPS OP) Apply 1 drop to eye daily.   Yes Historical Provider, MD   Physical Exam: Filed Vitals:  08/04/12 1246 08/04/12 1248 08/04/12 1405 08/04/12 1500  BP:  150/53 126/54 111/61  Pulse:  94 78 79  Temp:  102.5 F (39.2 C) 99.3 F (37.4 C)   TempSrc:  Oral    Resp:  22 18 23   Height: 5\' 7"  (1.702 m)     Weight: 68.04 kg (150 lb)     SpO2:  97% 93% 92%     General:  Patient is lying down in bed, does not appear to be toxic, able to participate in history, no signs of distress  Eyes: Pupils are equal reactive to  light  ENT: No pharyngeal erythema, mucous membranes are dry  Neck: Supple  Cardiovascular: S1, S2, regular rate and rhythm  Respiratory: Clear to auscultation bilaterally  Abdomen: Soft, nontender, nondistended, bowel sounds are active  Skin: No visible rashes  Musculoskeletal: Deferred  Psychiatric: Normal affect, cooperative with exam  Neurologic: Grossly intact, nonfocal  Labs on Admission:  Basic Metabolic Panel:  Lab 08/04/12 1610  NA 129*  K 3.3*  CL 96  CO2 24  GLUCOSE 204*  BUN 40*  CREATININE 2.02*  CALCIUM 9.4  MG --  PHOS --   Liver Function Tests:  Lab 08/04/12 1345  AST 27  ALT 10  ALKPHOS 61  BILITOT 0.3  PROT 6.6  ALBUMIN 2.9*    Lab 08/04/12 1345  LIPASE 36  AMYLASE --   No results found for this basename: AMMONIA:5 in the last 168 hours CBC:  Lab 08/04/12 1345  WBC 9.5  NEUTROABS 7.7  HGB 8.9*  HCT 26.3*  MCV 85.9  PLT 174   Cardiac Enzymes:  Lab 08/04/12 1345  CKTOTAL --  CKMB --  CKMBINDEX --  TROPONINI 1.24*    BNP (last 3 results) No results found for this basename: PROBNP:3 in the last 8760 hours CBG: No results found for this basename: GLUCAP:5 in the last 168 hours  Radiological Exams on Admission: Dg Chest 2 View  08/04/2012  *RADIOLOGY REPORT*  Clinical Data:  Cough, evaluate for infiltrate, CHF, free air  CHEST - 2 VIEW  Comparison: Most recent prior chest x-ray 09/26/2009  Findings: Lungs are negative for pulmonary edema, or focal airspace consolidation.  No pneumothorax, or pleural effusion.  Cardiac and mediastinal contours within normal limits.  There is atherosclerosis of the transverse aorta.  No acute osseous abnormality.  No free air under the diaphragm on the upright view. Contiguous flowing anterior osteophytes along the thoracic spine consistent with dystrophic idiopathic skeletal hyperostosis (DISH)  IMPRESSION:  No acute cardiopulmonary disease  Aortic atherosclerosis   Original Report Authenticated  By: HEATH     EKG: Independently reviewed. Shows T-wave inversions in the anterolateral leads. These may be slightly more pronounced than prior EKG. No other changes noted when compared to older EKGs.  Assessment/Plan Principal Problem:  *Sepsis Active Problems:  UTI (urinary tract infection)  Dehydration  MI, acute, non ST segment elevation  Acute renal failure  Hyponatremia  Hypokalemia  Anemia  HTN (hypertension)  Gout   1. Sepsis, likely secondary to urinary tract infection. Patient will be continued on Rocephin that was started in the emergency room. Her cultures and urine culture have been sent. He'll need to be followed up. Lactic acid is currently normal but procalcitonin is elevated. We will follow the patient clinically. We will continue with IV fluids. 2. Dehydration. Provide IV fluids, encourage by mouth 3. Acute renal failure. Likely secondary to volume depletion and dehydration. Provide IV fluids and  recheck creatinine in the morning. Hold ACE inhibitor and diuretics. 4. Acute MI. Patient has ruled in for acute myocardial infarction with positive cardiac enzymes. This is likely secondary to a demand ischemia. He does not have any chest pain, shortness of breath, or new EKG changes. We will continue the patient on aspirin. Will check echocardiogram and repeat EKG in the morning. We'll request a cardiology consultation. Since the patient's elevated troponins secondary to demand ischemia, I did not feel that anticoagulation would be of benefit at this time. With his significant anemia he is also a higher risk candidate for anticoagulation. It is unclear whether she's had a prior cardiac testing. 5. Anemia. Patient reports a chronic anemia. Previously in February he was found to be B12 deficient as well as having a low ferritin. We'll repeat these studies. Rectal exam done by emergency room physician was found to be heme negative and showed brown stool. 6. Generalized weakness. Her  last physical therapy consultation and will have skilled nursing facility placement if she qualifies. 7. Gout. Continue outpatient regimen 8. Hypertension. We'll hold antihypertensives at this point due to ongoing sepsis. This can be restarted as appropriate  Code Status: Full code Family Communication: Discussed with patient's family friends at the bedside. Also discussed with patient's daughter, [name] [phone]. Disposition Plan: Pending hospital course  Time spent: Critical care time 60 minutes  Macario Shear [facility] Pager [phone]  If 7PM-7AM, please contact night-coverage [email or web address] Password [email or web address] 08/04/2012, 5:06 PM

## 2012-08-04 NOTE — ED Notes (Signed)
CRITICAL VALUE ALERT  Critical value received:  Troponin 1.24  Date of notification:  08/04/2012  Time of notification:  1415  Critical value read back:yes  Nurse who received alert:  Wilkie Aye Tirth Cothron,rn  MD notified (1st page):  Dr. Clarene Duke  Time of first page:  1415  MD notified (2nd page):  Time of second page:  Responding MD:  Dr. Clarene Duke  Time MD responded:  1415

## 2012-08-04 NOTE — ED Provider Notes (Signed)
History     CSN: [id number]  Arrival date & time 08/04/12  1250   First MD Initiated Contact with Patient 08/04/12 1306      Chief Complaint  Patient presents with  . Fatigue  . Rectal Bleeding  . Fever  . Shortness of Breath     HPI Pt was seen at 1305.  Per pt, c/o gradual onset and persistence of constant home fevers, "loose dark stools after taking a stool softener," as well as generalized weakness and fatigue for the past 2 days.  Pt states he lives alone.  EMS gave tylenol for temp "101" on their arrival to scene.  Denies CP/palpitations, no SOB/cough, no rash, no abd pain, no N/V, no back or neck pain.    Past Medical History  Diagnosis Date  . Diabetes mellitus   . Hypertension   . Arthritis   . Prostate cancer   . Gout     Past Surgical History  Procedure Date  . Prostate surgery   . Umbilical hernia repair    History  Substance Use Topics  . Smoking status: Former Games developer  . Smokeless tobacco: Not on file  . Alcohol Use: No    Review of Systems ROS: Statement: All systems negative except as marked or noted in the HPI; Constitutional: +generalized weakness, fever and chills. ; ; Eyes: Negative for eye pain, redness and discharge. ; ; ENMT: Negative for ear pain, hoarseness, nasal congestion, sinus pressure and sore throat. ; ; Cardiovascular: Negative for chest pain, palpitations, diaphoresis, dyspnea and peripheral edema. ; ; Respiratory: Negative for cough, wheezing and stridor. ; ; Gastrointestinal: +"dark stools." Negative for nausea, vomiting, diarrhea, abdominal pain, blood in stool, hematemesis, jaundice and rectal bleeding. . ; ; Genitourinary: Negative for dysuria, flank pain and hematuria. ; ; Musculoskeletal: Negative for back pain and neck pain. Negative for swelling and trauma.; ; Skin: Negative for pruritus, rash, abrasions, blisters, bruising and skin lesion.; ; Neuro: Negative for headache, lightheadedness and neck stiffness. Negative for weakness,  altered level of consciousness , altered mental status, extremity weakness, paresthesias, involuntary movement, seizure and syncope.       Allergies  Review of patient's allergies indicates no known allergies.  Home Medications   Current Outpatient Rx  Name Route Sig Dispense Refill  . ALLOPURINOL 100 MG PO TABS Oral Take 100 mg by mouth daily.      Marland Kitchen AMLODIPINE BESYLATE 5 MG PO TABS Oral Take 5 mg by mouth daily.      . ASPIRIN EC 81 MG PO TBEC Oral Take 81 mg by mouth daily.      Marland Kitchen CLONIDINE-CHLORTHALIDONE 0.1-15 MG PO TABS Oral Take 1 tablet by mouth daily.      Marland Kitchen LISINOPRIL 5 MG PO TABS Oral Take 5 mg by mouth daily.      . CENTRUM SILVER ADULT 50+ PO TABS Oral Take 1 tablet by mouth daily.    Marland Kitchen POLYSACCHARIDE IRON 150 MG PO CAPS Oral Take 150 mg by mouth daily.    Marland Kitchen EYE DROPS OP Ophthalmic Apply 1 drop to eye daily.      BP 150/53  Pulse 94  Temp 102.5 F (39.2 C) (Oral)  Resp 22  Ht 5\' 7"  (1.702 m)  Wt 150 lb (68.04 kg)  BMI 23.49 kg/m2  SpO2 97%  Physical Exam 1310: Physical examination:  Nursing notes reviewed; Vital signs and O2 SAT reviewed;  Constitutional: Well developed, Well nourished, In no acute distress; Head:  Normocephalic, atraumatic;  Eyes: EOMI, PERRL, No scleral icterus; ENMT: Mouth and pharynx normal, Mucous membranes dry; Neck: Supple, Full range of motion, No lymphadenopathy; Cardiovascular: Regular rate and rhythm, No murmur, rub, or gallop; Respiratory: Breath sounds clear & equal bilaterally, No rales, rhonchi, wheezes.  Speaking full sentences with ease, Normal respiratory effort/excursion; Chest: Nontender, Movement normal; Abdomen: Soft, Nontender, Nondistended, Normal bowel sounds;; Rectal exam performed w/permission of pt and ED RN chaparone present.  Anal tone normal.  Non-tender, soft brown stool in rectal vault, heme neg.  No fissures, no external hemorrhoids, no palp masses.;; Extremities: Pulses normal, No tenderness, No edema, No calf edema or  asymmetry.; Neuro: AA&Ox3, Major CN grossly intact.  Speech clear. No gross focal motor or sensory deficits in extremities.; Skin: Color normal, Warm, Dry.   ED Course  Procedures   MDM  MDM Reviewed: nursing note, vitals and previous chart Reviewed previous: ECG and labs Interpretation: ECG, labs and x-ray    Date: 08/04/2012  Rate: 82  Rhythm: normal sinus rhythm  QRS Axis: normal  Intervals: normal  ST/T Wave abnormalities: nonspecific ST/T changes, ST depression and TWI anterior-lateral and inferior leads  Conduction Disutrbances:none  Narrative Interpretation: No ST elevations  Old EKG Reviewed: unchanged; no significant changes from previous EKG dated 01/08/2012.   Results for orders placed during the hospital encounter of 08/04/12  CBC WITH DIFFERENTIAL      Component Value Range   WBC 9.5  4.0 - 10.5 K/uL   RBC 3.06 (*) 4.22 - 5.81 MIL/uL   Hemoglobin 8.9 (*) 13.0 - 17.0 g/dL   HCT 16.1 (*) 09.6 - 04.5 %   MCV 85.9  78.0 - 100.0 fL   MCH 29.1  26.0 - 34.0 pg   MCHC 33.8  30.0 - 36.0 g/dL   RDW 40.9 (*) 81.1 - 91.4 %   Platelets 174  150 - 400 K/uL   Neutrophils Relative 81 (*) 43 - 77 %   Neutro Abs 7.7  1.7 - 7.7 K/uL   Lymphocytes Relative 10 (*) 12 - 46 %   Lymphs Abs 0.9  0.7 - 4.0 K/uL   Monocytes Relative 10  3 - 12 %   Monocytes Absolute 0.9  0.1 - 1.0 K/uL   Eosinophils Relative 0  0 - 5 %   Eosinophils Absolute 0.0  0.0 - 0.7 K/uL   Basophils Relative 0  0 - 1 %   Basophils Absolute 0.0  0.0 - 0.1 K/uL  COMPREHENSIVE METABOLIC PANEL      Component Value Range   Sodium 129 (*) 135 - 145 mEq/L   Potassium 3.3 (*) 3.5 - 5.1 mEq/L   Chloride 96  96 - 112 mEq/L   CO2 24  19 - 32 mEq/L   Glucose, Bld 204 (*) 70 - 99 mg/dL   BUN 40 (*) 6 - 23 mg/dL   Creatinine, Ser 7.82 (*) 0.50 - 1.35 mg/dL   Calcium 9.4  8.4 - 95.6 mg/dL   Total Protein 6.6  6.0 - 8.3 g/dL   Albumin 2.9 (*) 3.5 - 5.2 g/dL   AST 27  0 - 37 U/L   ALT 10  0 - 53 U/L   Alkaline  Phosphatase 61  39 - 117 U/L   Total Bilirubin 0.3  0.3 - 1.2 mg/dL   GFR calc non Af Amer 27 (*) >90 mL/min   GFR calc Af Amer 32 (*) >90 mL/min  LIPASE, BLOOD      Component Value Range  Lipase 36  11 - 59 U/L  LACTIC ACID, PLASMA      Component Value Range   Lactic Acid, Venous 1.3  0.5 - 2.2 mmol/L  PROCALCITONIN      Component Value Range   Procalcitonin 21.50    TROPONIN I      Component Value Range   Troponin I 1.24 (*) <0.30 ng/mL  URINALYSIS, ROUTINE W REFLEX MICROSCOPIC      Component Value Range   Color, Urine YELLOW  YELLOW   APPearance HAZY (*) CLEAR   Specific Gravity, Urine 1.025  1.005 - 1.030   pH 5.5  5.0 - 8.0   Glucose, UA NEGATIVE  NEGATIVE mg/dL   Hgb urine dipstick LARGE (*) NEGATIVE   Bilirubin Urine NEGATIVE  NEGATIVE   Ketones, ur NEGATIVE  NEGATIVE mg/dL   Protein, ur 454 (*) NEGATIVE mg/dL   Urobilinogen, UA 0.2  0.0 - 1.0 mg/dL   Nitrite NEGATIVE  NEGATIVE   Leukocytes, UA SMALL (*) NEGATIVE  URINE MICROSCOPIC-ADD ON      Component Value Range   Squamous Epithelial / LPF RARE  RARE   WBC, UA TOO NUMEROUS TO COUNT  <3 WBC/hpf   RBC / HPF 3-6  <3 RBC/hpf   Bacteria, UA MANY (*) RARE    Dg Chest 2 View 08/04/2012  *RADIOLOGY REPORT*  Clinical Data:  Cough, evaluate for infiltrate, CHF, free air  CHEST - 2 VIEW  Comparison: Most recent prior chest x-ray 09/26/2009  Findings: Lungs are negative for pulmonary edema, or focal airspace consolidation.  No pneumothorax, or pleural effusion.  Cardiac and mediastinal contours within normal limits.  There is atherosclerosis of the transverse aorta.  No acute osseous abnormality.  No free air under the diaphragm on the upright view. Contiguous flowing anterior osteophytes along the thoracic spine consistent with dystrophic idiopathic skeletal hyperostosis (DISH)  IMPRESSION:  No acute cardiopulmonary disease  Aortic atherosclerosis   Original Report Authenticated By: Milton Ferguson for ELON, EOFF (MRN  [id number]) as of 08/04/2012 16:01  Ref. Range 09/29/2009 05:20 10/18/2011 20:27 01/08/2012 17:55 08/04/2012 13:45  BUN Latest Range: 6-23 mg/dL 12 20 18  40 (H)  Creatinine Latest Range: 0.50-1.35 mg/dL 8.29 5.62 1.30 8.65 (H)   Results for ALAN, DRUMMER (MRN [id number]) as of 08/04/2012 16:01  Ref. Range 09/28/2009 05:05 09/29/2009 05:20 10/18/2011 20:27 01/08/2012 17:55 08/04/2012 13:45  Hemoglobin Latest Range: 13.0-17.0 g/dL 28.4 (L) 13.2 (L) 44.0 (L) 7.4 (L) 8.9 (L)  HCT Latest Range: 39.0-52.0 % 34.2 (L) 33.8 (L) 29.6 (L) 23.8 (L) 26.3 (L)     1550:  +UTI, UC pending.  BC x2 pending.  Will dose IV rocephin.  ASA given for elevated troponin; no new changes on EKG today and pt denies CP or SOB to me during initial and multiple re-evals.  Elevation likely due to demand due to febrile illness.  Concern regarding starting IV heparin at this time due to pt hx anemia as well as self-reported "dark stools" for the past several days; will hold for now. Fever improved after APAP.  New hyponatremia and renal insufficiency; likely dehydration.  Continues anemic, heme negative stool today on exam; though pt c/o "dark stools" at home for the past several days. No stooling while in the ED.  VSS without hypotension while in the ED.  Neuro exam intact and non-focal.  Dx testing d/w pt.  Questions answered.  Verb understanding, agreeable to admit.  T/C to [facility] Dr. Kerry Hough, case discussed, including:  HPI, pertinent PM/SHx, VS/PE, dx testing, ED course and treatment:  Agreeable to admit, requests to obtain stepdown bed to team 2.          Laray Anger, DO 08/07/12 1721

## 2012-08-04 NOTE — ED Notes (Signed)
PT c/o increased weakness, black stools, fever, and sob since Friday. EMS gave pt 1000mg  of tylenol for fever of 101.

## 2012-08-05 ENCOUNTER — Inpatient Hospital Stay (HOSPITAL_COMMUNITY): Payer: Medicare Other

## 2012-08-05 ENCOUNTER — Encounter (HOSPITAL_COMMUNITY): Payer: Self-pay | Admitting: Adult Health

## 2012-08-05 DIAGNOSIS — I359 Nonrheumatic aortic valve disorder, unspecified: Secondary | ICD-10-CM

## 2012-08-05 DIAGNOSIS — N179 Acute kidney failure, unspecified: Secondary | ICD-10-CM

## 2012-08-05 DIAGNOSIS — E860 Dehydration: Secondary | ICD-10-CM

## 2012-08-05 DIAGNOSIS — I10 Essential (primary) hypertension: Secondary | ICD-10-CM

## 2012-08-05 LAB — GLUCOSE, CAPILLARY
Glucose-Capillary: 156 mg/dL — ABNORMAL HIGH (ref 70–99)
Glucose-Capillary: 286 mg/dL — ABNORMAL HIGH (ref 70–99)
Glucose-Capillary: 120 mg/dL — ABNORMAL HIGH (ref 70–99)

## 2012-08-05 LAB — CBC
HCT: 27.3 % — ABNORMAL LOW (ref 39.0–52.0)
Hemoglobin: 9 g/dL — ABNORMAL LOW (ref 13.0–17.0)
WBC: 6.8 10*3/uL (ref 4.0–10.5)

## 2012-08-05 LAB — HEMOGLOBIN A1C: Hgb A1c MFr Bld: 7.1 % — ABNORMAL HIGH (ref <5.7)

## 2012-08-05 LAB — IRON AND TIBC
Iron: <10 ug/dL — ABNORMAL LOW (ref 42–135)
UIBC: 280 ug/dL (ref 125–400)

## 2012-08-05 LAB — COMPREHENSIVE METABOLIC PANEL
ALT: 12 U/L (ref 0–53)
Calcium: 8.8 mg/dL (ref 8.4–10.5)
GFR calc Af Amer: 36 mL/min — ABNORMAL LOW (ref >90)
Glucose, Bld: 121 mg/dL — ABNORMAL HIGH (ref 70–99)
Sodium: 134 mEq/L — ABNORMAL LOW (ref 135–145)
Total Protein: 6.6 g/dL (ref 6.0–8.3)

## 2012-08-05 LAB — FOLATE: Folate: 17 ng/mL

## 2012-08-05 LAB — PRO B NATRIURETIC PEPTIDE: Pro B Natriuretic peptide (BNP): 981 pg/mL — ABNORMAL HIGH (ref 0–450)

## 2012-08-05 MED ORDER — NAPHAZOLINE HCL 0.1 % OP SOLN
1.0000 [drp] | Freq: Every day | OPHTHALMIC | Status: DC
  Administered 2012-08-05 – 2012-08-08 (×4): 1 [drp] via OPHTHALMIC
  Filled 2012-08-05: qty 15

## 2012-08-05 MED ORDER — AMLODIPINE BESYLATE 5 MG PO TABS
10.0000 mg | ORAL_TABLET | Freq: Every day | ORAL | Status: DC
  Administered 2012-08-06 – 2012-08-08 (×3): 10 mg via ORAL
  Filled 2012-08-05 (×3): qty 2

## 2012-08-05 MED ORDER — HYDRALAZINE HCL 20 MG/ML IJ SOLN
5.0000 mg | INTRAMUSCULAR | Status: DC | PRN
  Administered 2012-08-05: 5 mg via INTRAVENOUS
  Filled 2012-08-05: qty 1

## 2012-08-05 MED ORDER — ENSURE COMPLETE PO LIQD
237.0000 mL | Freq: Two times a day (BID) | ORAL | Status: DC
  Administered 2012-08-05 – 2012-08-08 (×7): 237 mL via ORAL

## 2012-08-05 MED ORDER — AMLODIPINE BESYLATE 5 MG PO TABS
5.0000 mg | ORAL_TABLET | ORAL | Status: AC
  Administered 2012-08-05: 5 mg via ORAL
  Filled 2012-08-05: qty 1

## 2012-08-05 NOTE — Progress Notes (Signed)
*  PRELIMINARY RESULTS* Echocardiogram 2D Echocardiogram has been performed.  Conrad [place] 08/05/2012, 11:48 AM

## 2012-08-05 NOTE — Progress Notes (Signed)
OT Cancellation Note  Occupational Therapy Evaluation cancelled today due to patient's refusal to participate. Patient reports having increased back pain and would like to wait until tomorrow to see therapist. Will reschedule eval when appropriate.  Limmie Patricia, OTR/L 08/05/2012, 3:41 PM

## 2012-08-05 NOTE — Consult Note (Signed)
CARDIOLOGY CONSULT NOTE  Patient ID: [name] MRN: [id number] DOB/AGE: 12-10-1919 78 y.o.  Admit date: 08/04/2012 Referring Physician: PTH- Memun Primary PhysicianHILL,GERALD K, MD Primary Cardiologist: Donia Guiles Reason for Consultation: Type II NSTEMI Principal Problem:  *Sepsis Active Problems:  UTI (urinary tract infection)  Dehydration  MI, acute, non ST segment elevation  Acute renal failure  Hyponatremia  Hypokalemia  Anemia  HTN (hypertension)  Gout  HPI: [name] is a 78 year old male patient who has not been seen by cardiology since 2008 at which time he was followed by Dr. Shana Chute for a stress test. The patient is a poor historian, history is obtained from current and past medical records. He was admitted with sepsis, fever of 102.5, dehydration, UTI, renal failure, anemia (apparently chronic, hemoglobin of 8.9 with hematocrit of 26.3 on admission) and was found to have a positive troponin at 1.2, with subsequent troponin 0.94 and 0.61 respectively. Records shows that he had a stress Myoview in 2008 which was negative for ischemia. He has a prior history of diabetes hypertension and arthritis.  He is currently being treated with IV hydration, and antibiotics. Chest x-ray was negative for acute pulmonary edema or pneumonia.[id number]. We are asked proBNP.  We  are asked cardiac recommendations in this setting.     The patient, as stated, is a poor historian but denies any chest pain shortness of breath nausea vomiting or diaphoresis. Arrival to the emergency room the patient's blood pressure was 150/53 with a heart rate of 94 and a temp of 102.5. He was negative for MRSA. It was noted that his potassium was at 3.3, with a serum creatinine of 2.0 to much higher than his baseline review of labs and 0.98-1.35. ACE inhibitor was discontinued as he had been taking lisinopril 5 mg at home.  Review of systems complete and found to be negative unless listed above   Past Medical History   Diagnosis Date  . Diabetes mellitus   . Hypertension   . Arthritis   . Prostate cancer   . Gout     Family History: No family history of coronary artery disease per records. Positive for Hypertension,CHF and cancer.  History   Social History  . Marital Status: Divorced    Spouse Name: N/A    Number of Children: N/A  . Years of Education: N/A   Occupational History  . Not on file.   Social History Main Topics  . Smoking status: Former Games developer  . Smokeless tobacco: Not on file  . Alcohol Use: No  . Drug Use: No  . Sexually Active: No   Other Topics Concern  . Not on file   Social History Narrative  . No narrative on file    Past Surgical History  Procedure Date  . Prostate surgery   . Umbilical hernia repair      Prescriptions prior to admission  Medication Sig Dispense Refill  . allopurinol (ZYLOPRIM) 100 MG tablet Take 100 mg by mouth daily.        Marland Kitchen amLODipine (NORVASC) 5 MG tablet Take 5 mg by mouth daily.        Marland Kitchen aspirin EC 81 MG tablet Take 81 mg by mouth daily.        . cloNIDine-chlorthalidone (CLORPRES) 0.1-15 MG per tablet Take 1 tablet by mouth daily.        . insulin glargine (LANTUS) 100 UNIT/ML injection Inject 24 Units into the skin every morning. According to pt he checks his blood sugar  once a day and then gives himself Lantus based off what his blood sugar is running.  It usually averages 24units/day.      . lisinopril (PRINIVIL,ZESTRIL) 5 MG tablet Take 5 mg by mouth daily.        . Multiple Vitamins-Minerals (CENTRUM SILVER ADULT 50+) TABS Take 1 tablet by mouth daily.      . polysaccharide iron (NIFEREX) 150 MG CAPS capsule Take 150 mg by mouth daily.      . Tetrahydrozoline HCl (EYE DROPS OP) Apply 1 drop to eye daily.       Physical Exam: Blood pressure 148/58, pulse 87, temperature 98.7 F (37.1 C), temperature source Oral, resp. rate 23, height 5\' 7"  (1.702 m), weight 150 lb 5.7 oz (68.2 kg), SpO2 97.00%.   General: Well developed, well  nourished, in no acute distress Head: Eyes PERRLA, No xanthomas.   Normal cephalic and atramatic  Lungs: Clear bilaterally to auscultation and percussion. Heart: HRRR S1 S2, 2-3/6 systolic ejection murmur heard best at the cardiac base,  Pulses are 2+ & equal.  Radiation of murmur to carotids versus bilateral bruits. No JVD.  No abdominal bruits. No femoral bruits. Abdomen: Bowel sounds are positive, abdomen soft and non-tender without masses Msk:  Back normal, Diminished strength and tone for age. Skin is very dry.  Extremities: No clubbing, cyanosis or edema.  DP diminished Neuro: Alert and oriented , very poor historian at this time. Psych:  Good affect, responds appropriately, poor memory of events    Lab Results  Component Value Date   WBC 6.8 08/05/2012   HGB 9.0* 08/05/2012   HCT 27.3* 08/05/2012   MCV 85.3 08/05/2012   PLT 152 08/05/2012     Lab 08/05/12 0545  NA 134*  K 3.4*  CL 101  CO2 20  BUN 37*  CREATININE 1.84*  CALCIUM 8.8  PROT 6.6  BILITOT 0.3  ALKPHOS 68  ALT 12  AST 29  GLUCOSE 121*   Lab Results  Component Value Date   TROPONINI 0.61* 08/05/2012    Stress Myoview 08/14/2007  Fixed thinning of the inferior wall vs diaphragmatic attenuation artifact.  No stress induced ischemia. Ejection fraction 53%.      Radiology: Dg Chest 08/04/12: Aortic atheroma, no acute cardiopulmonary disease  [id number] tachycardia rate of 100 bpm, ST & T-wave changes-consider anterolateral ischemia or LVH.  ASSESSMENT AND PLAN:   1. Acute NSTEMI Type II: EKG abnormalities are not strikingly different than was identified on a previous tracing performed in 12/2011.  Troponin elevation he is in the setting of renal failure, sepsis and dehydration. Echocardiogram pending. been ordered. Stress Myoview completed by Dr. Shana Chute in 2008 was negative for ischemia.  Agree with holding ACE inhibitor for now in the setting of renal insufficiency. Continue aspirin. No invasive cardiac testing is  recommended to to the minimal amount of myocardial tissue loss and the patient's advanced age.    2. Hypertension: Blood pressure has been labile during hospitalization.  We will increase Norvasc to 10 mg daily from 5 mg.  3. Anemia: Hemoglobin 9.0 currently. Review of anemia panel dated 01/08/2012 revealed iron saturation low at 18 with a ferritin low at 8, but normal iron level of 76. Per H&P he is on B12 injections.  Iron deficiency is certainly a consideration.  Bettey Mare. Lyman Bishop NP [facility] 08/05/2012, 9:44 AM  Cardiology Attending Patient interviewed and examined. Discussed with Joni Reining, NP.  Above note annotated and modified based upon  my findings.  Patient's noncardiac issues are much more important at present than the minimal infarction he suffered as the apparent result of demand ischemia.  Depending upon the patient's functional and medical status after recovery from his acute illness, we may consider functional testing to rule out severe ischemia, but testing will only have potential value of patient and family would agree to intervention should the test be abnormal. This can be pursued in an outpatient return office visit.  [place] Bing, MD 08/08/2012, 1:46 PM

## 2012-08-05 NOTE — Evaluation (Signed)
Physical Therapy Evaluation Patient Details Name: [name] MRN: [id number] DOB: [birth year] Today's Date: 08/05/2012 Time: 4098-1191 PT Time Calculation (min): 45 min  PT Assessment / Plan / Recommendation Clinical Impression  Pt is very pleasant and cooperative, but significantly deconditioned due to recent illness.  He states that he normally ambulates independently at home.  Currently, he is very unstable with standing and needs a walker for support during gait.  He will definately need SNF at d/c.  Pt is in agreement.    PT Assessment  Patient needs continued PT services    Follow Up Recommendations  Skilled nursing facility    Barriers to Discharge Decreased caregiver support      Equipment Recommendations  Defer to next venue    Recommendations for Other Services     Frequency Min 3X/week    Precautions / Restrictions Precautions Precautions: Fall Restrictions Weight Bearing Restrictions: No   Pertinent Vitals/Pain       Mobility  Bed Mobility Bed Mobility: Supine to Sit;Sit to Supine Supine to Sit: 4: Min guard;HOB elevated (very labored) Transfers Transfers: Sit to Stand;Stand to Sit Sit to Stand: 4: Min assist;With upper extremity assist;From bed (very unsteady upon standing...legs trembling) Stand to Sit: 4: Min guard;With upper extremity assist;To chair/3-in-1 Ambulation/Gait Ambulation/Gait Assistance: 4: Min assist Ambulation Distance (Feet): 80 Feet Assistive device: Rolling walker Gait Pattern: Step-through pattern;Trunk flexed;Shuffle Stairs: No Wheelchair Mobility Wheelchair Mobility: No    Exercises     PT Diagnosis: Difficulty walking;Generalized weakness  PT Problem List: Decreased strength;Decreased activity tolerance;Decreased balance;Decreased mobility;Decreased knowledge of use of DME;Decreased safety awareness PT Treatment Interventions: DME instruction;Gait training;Functional mobility training;Therapeutic activities;Therapeutic  exercise;Patient/family education   PT Goals Acute Rehab PT Goals PT Goal Formulation: With patient Time For Goal Achievement: 08/19/12 Potential to Achieve Goals: Good Pt will go Supine/Side to Sit: with modified independence;with HOB 0 degrees PT Goal: Supine/Side to Sit - Progress: Goal set today Pt will go Sit to Supine/Side: with modified independence;with HOB 0 degrees PT Goal: Sit to Supine/Side - Progress: Goal set today Pt will go Sit to Stand: with supervision;with upper extremity assist PT Goal: Sit to Stand - Progress: Goal set today Pt will Ambulate: 51 - 150 feet;with modified independence PT Goal: Ambulate - Progress: Goal set today  Visit Information  Last PT Received On: 08/05/12    Subjective Data  Subjective: No c/o Patient Stated Goal: none stated   Prior Functioning  Home Living Lives With: Alone Available Help at Discharge: Friend(s);Neighbor;Available PRN/intermittently Type of Home: Apartment Home Access: Level entry Home Layout: One level Home Adaptive Equipment: None Prior Function Level of Independence: Independent Driving: Yes Vocation: Retired Musician: Clinical cytogeneticist  Overall Cognitive Status: Appears within functional limits for tasks assessed/performed Arousal/Alertness: Awake/alert Orientation Level: Appears intact for tasks assessed Behavior During Session: [facility] for tasks performed    Extremity/Trunk Assessment Right Lower Extremity Assessment RLE ROM/Strength/Tone: WFL for tasks assessed RLE Sensation: WFL - Light Touch RLE Coordination: WFL - gross motor Left Lower Extremity Assessment LLE ROM/Strength/Tone: WFL for tasks assessed LLE Sensation: WFL - Light Touch Trunk Assessment Trunk Assessment: Kyphotic   Balance Balance Balance Assessed: Yes Static Standing Balance Static Standing - Level of Assistance: 4: Min assist (unable to stand without 2 hand support)  End of Session PT - End of  Session Equipment Utilized During Treatment: Gait belt Activity Tolerance: Patient tolerated treatment well;Patient limited by fatigue Patient left: in chair;with call bell/phone within reach Nurse Communication: Mobility  status  GP     [name] 08/05/2012, 1:45 PM

## 2012-08-05 NOTE — Progress Notes (Signed)
[facility] PROGRESS NOTE  [name] AVW:[id number] DOB: 07-Jul-1920 DOA: 08/04/2012 PCP: Evlyn Courier, MD  Assessment/Plan: Principal Problem:  *Sepsis Active Problems:  UTI (urinary tract infection)  Dehydration  MI, acute, non ST segment elevation  Acute renal failure  Hyponatremia  Hypokalemia  Anemia  HTN (hypertension)  Gout  1. Sepsis likely secondary to urinary tract infection. Patient remained febrile overnight. He is continued on Rocephin. Blood cultures and urine cultures are currently pending and will need to be followed up. We will continue current antibiotics right now. If he continues to be febrile tomorrow then we may need to broaden his antibiotic coverage. 2. Acute renal failure secondary to dehydration. Mild improvement creatinine with IV fluids. Continue current treatments and follow renal function. ACE inhibitor on hold 3. Acute MI. Patient has ruled in for acute MI with positive troponins. This is likely secondary to demand ischemia. 2-D echocardiogram has been ordered as well as cardiology consultation. Patient is on aspirin. 4. Hypokalemia, improved 5. Anemia, chronic, stable 6. Generalized weakness. Physical therapy evaluation pending  Code Status: Full code Family Communication: Discussed with patient, left voicemail for daughter Stanton Kidney Disposition Plan: Pending further clinical improvement, may need placement in a skilled nursing facility if he qualifies   Brief narrative: This gentleman was brought to the emergency room with generalized weakness and fevers. He lives alone. He was found to have a urinary tract infection in the emergency room, was also in acute renal failure secondary to dehydration, and likely had underlying sepsis. Patient was admitted to the step down unit for further management.  Consultants:  Cardiology  Procedures:  none  Antibiotics:  rocephin  HPI/Subjective: Patient denies any complaints this morning. He denies  any vomiting or diarrhea.  He does not have any shortness of breath, and only has a mild cough.  No chest pain. He ate some of his breakfast this morning  Objective: Filed Vitals:   08/05/12 0600 08/05/12 0700 08/05/12 0751 08/05/12 0800  BP: 168/57 144/47  148/58  Pulse: 98 87    Temp:   98.7 F (37.1 C)   TempSrc:   Oral   Resp: 24 35  23  Height:      Weight:      SpO2: 97% 97%      Intake/Output Summary (Last 24 hours) at 08/05/12 0836 Last data filed at 08/05/12 0800  Gross per 24 hour  Intake   2645 ml  Output   1000 ml  Net   1645 ml   Filed Weights   08/04/12 1246 08/04/12 1720 08/05/12 0536  Weight: 68.04 kg (150 lb) 66.9 kg (147 lb 7.8 oz) 68.2 kg (150 lb 5.7 oz)    Exam:   General:  NAD  Cardiovascular: s1, s2, rrr, no pedal edema  Respiratory: cta b  Abdomen: soft, nt, bs+  Data Reviewed: Basic Metabolic Panel:  Lab 08/05/12 8295 08/04/12 1345  NA 134* 129*  K 3.4* 3.3*  CL 101 96  CO2 20 24  GLUCOSE 121* 204*  BUN 37* 40*  CREATININE 1.84* 2.02*  CALCIUM 8.8 9.4  MG 1.9 --  PHOS -- --   Liver Function Tests:  Lab 08/05/12 0545 08/04/12 1345  AST 29 27  ALT 12 10  ALKPHOS 68 61  BILITOT 0.3 0.3  PROT 6.6 6.6  ALBUMIN 2.8* 2.9*    Lab 08/04/12 1345  LIPASE 36  AMYLASE --   No results found for this basename: AMMONIA:5 in the last 168 hours CBC:  Lab 08/05/12 0545 08/04/12 1345  WBC 6.8 9.5  NEUTROABS -- 7.7  HGB 9.0* 8.9*  HCT 27.3* 26.3*  MCV 85.3 85.9  PLT 152 174   Cardiac Enzymes:  Lab 08/05/12 0545 08/05/12 0051 08/04/12 1345  CKTOTAL -- -- --  CKMB -- -- --  CKMBINDEX -- -- --  TROPONINI 0.61* 0.94* 1.24*   BNP (last 3 results)  Basename 08/05/12 0545  PROBNP 981.0*   CBG:  Lab 08/05/12 0803 08/04/12 2129 08/04/12 1822  GLUCAP 124* 256* 145*    Recent Results (from the past 240 hour(s))  CULTURE, BLOOD (ROUTINE X 2)     Status: Normal (Preliminary result)   Collection Time   08/04/12  4:20 PM       Component Value Range Status Comment   Specimen Description BLOOD LEFT ARM   Final    Special Requests     Final    Value: BOTTLES DRAWN AEROBIC AND ANAEROBIC 6CC EACH BOTTLE   Culture PENDING   Incomplete    Report Status PENDING   Incomplete   CULTURE, BLOOD (ROUTINE X 2)     Status: Normal (Preliminary result)   Collection Time   08/04/12  4:45 PM      Component Value Range Status Comment   Specimen Description BLOOD LEFT HAND   Final    Special Requests BOTTLES DRAWN AEROBIC ONLY 6CC BOTTLE   Final    Culture PENDING   Incomplete    Report Status PENDING   Incomplete   MRSA PCR SCREENING     Status: Normal   Collection Time   08/04/12  5:20 PM      Component Value Range Status Comment   MRSA by PCR NEGATIVE  NEGATIVE Final      Studies: Dg Chest 2 View  08/04/2012  *RADIOLOGY REPORT*  Clinical Data:  Cough, evaluate for infiltrate, CHF, free air  CHEST - 2 VIEW  Comparison: Most recent prior chest x-ray 09/26/2009  Findings: Lungs are negative for pulmonary edema, or focal airspace consolidation.  No pneumothorax, or pleural effusion.  Cardiac and mediastinal contours within normal limits.  There is atherosclerosis of the transverse aorta.  No acute osseous abnormality.  No free air under the diaphragm on the upright view. Contiguous flowing anterior osteophytes along the thoracic spine consistent with dystrophic idiopathic skeletal hyperostosis (DISH)  IMPRESSION:  No acute cardiopulmonary disease  Aortic atherosclerosis   Original Report Authenticated By: [facility]    Portable Chest 1 View  08/05/2012  *RADIOLOGY REPORT*  Clinical Data: Sepsis, rule out pneumonia  PORTABLE CHEST - 1 VIEW  Comparison: 08/04/2012  Findings: Cardiomediastinal silhouette is stable.  No acute infiltrate or pulmonary edema.  Atherosclerotic calcifications of thoracic aorta.  Mild elevation of the right hemidiaphragm.  Mild basilar atelectasis.  IMPRESSION: No acute infiltrate or pulmonary edema.  Atherosclerotic  calcifications of thoracic aorta again noted.   Original Report Authenticated By: Natasha Mead, M.D.     Scheduled Meds:   . allopurinol  100 mg Oral Daily  . aspirin  324 mg Oral Once  . aspirin EC  325 mg Oral Daily  . cefTRIAXone (ROCEPHIN)  IV  1 g Intravenous Once  . cefTRIAXone (ROCEPHIN)  IV  1 g Intravenous Q24H  . enoxaparin (LOVENOX) injection  30 mg Subcutaneous Q24H  . insulin aspart  0-15 Units Subcutaneous TID WC  . insulin aspart  0-5 Units Subcutaneous QHS  . insulin glargine  20 Units Subcutaneous q morning - 10a  .  iron polysaccharides  150 mg Oral Daily  . naphazoline  1 drop Both Eyes Daily  . DISCONTD: enoxaparin (LOVENOX) injection  40 mg Subcutaneous Q24H  . DISCONTD: tetrahydrozoline  1 drop Both Eyes BID   Continuous Infusions:   . 0.9 % NaCl with KCl 20 mEq / L 100 mL/hr at 08/05/12 0800  . DISCONTD: sodium chloride 75 mL/hr at 08/04/12 1423    Principal Problem:  *Sepsis Active Problems:  UTI (urinary tract infection)  Dehydration  MI, acute, non ST segment elevation  Acute renal failure  Hyponatremia  Hypokalemia  Anemia  HTN (hypertension)  Gout    Time spent:    MEMON,JEHANZEB  [facility] Pager [phone]. If 7PM-7AM, please contact night-coverage at [email or web address], password [facility] 08/05/2012, 8:36 AM  LOS: 1 day

## 2012-08-05 NOTE — Progress Notes (Signed)
INITIAL ADULT NUTRITION ASSESSMENT Date: 08/05/2012   Time: 1:53 PM Reason for Assessment: MD consult for poor po's  INTERVENTION: Ensure Complete po BID, each supplement provides 350 kcal and 13 grams of protein.   ASSESSMENT: Male 78 y.o.  Dx: Sepsis  Hx:  Past Medical History  Diagnosis Date  . Diabetes mellitus   . Hypertension   . Arthritis   . Prostate cancer   . Gout    Past Surgical History  Procedure Date  . Prostate surgery   . Umbilical hernia repair    Related Meds:  Scheduled Meds:   . allopurinol  100 mg Oral Daily  . aspirin  324 mg Oral Once  . aspirin EC  325 mg Oral Daily  . cefTRIAXone (ROCEPHIN)  IV  1 g Intravenous Once  . cefTRIAXone (ROCEPHIN)  IV  1 g Intravenous Q24H  . enoxaparin (LOVENOX) injection  30 mg Subcutaneous Q24H  . insulin aspart  0-15 Units Subcutaneous TID WC  . insulin aspart  0-5 Units Subcutaneous QHS  . insulin glargine  20 Units Subcutaneous q morning - 10a  . iron polysaccharides  150 mg Oral Daily  . naphazoline  1 drop Both Eyes Daily  . DISCONTD: enoxaparin (LOVENOX) injection  40 mg Subcutaneous Q24H  . DISCONTD: tetrahydrozoline  1 drop Both Eyes BID   Continuous Infusions:   . 0.9 % NaCl with KCl 20 mEq / L 100 mL/hr at 08/05/12 1300  . DISCONTD: sodium chloride 75 mL/hr at 08/04/12 1423   PRN Meds:.acetaminophen, acetaminophen, ondansetron (ZOFRAN) IV, ondansetron  Ht: 5\' 7"  (170.2 cm)  Wt: 150 lb 5.7 oz (68.2 kg)  Ideal Wt:  66.1 kg % Ideal Wt: 101%  Usual Wt: 162 % Usual Wt: 93% Wt Readings from Last 10 Encounters:  08/05/12 150 lb 5.7 oz (68.2 kg)  01/08/12 157 lb (71.215 kg)  10/18/11 162 lb (73.483 kg)    Body mass index is 23.55 kg/(m^2). Classified as normal wt.   Food/Nutrition Related Hx: Pt declined interview at time of visit. Consult order reported that pt lives alone and often "forgets to eat". Order also indicated that pt claims that he eats 2 meals a day, but family members report  that does not eat much or very often.  Good reported intake during hospitalization; PO: 50-100%. Chart review reveals a 7# (4.4%) wt loss x 7 months.  Unable to diagnose malnutrition at this time, but pt is at risk for malnutrition given hx of wt loss and poor po intake.   Labs:  CMP     Component Value Date/Time   NA 134* 08/05/2012 0545   K 3.4* 08/05/2012 0545   CL 101 08/05/2012 0545   CO2 20 08/05/2012 0545   GLUCOSE 121* 08/05/2012 0545   BUN 37* 08/05/2012 0545   CREATININE 1.84* 08/05/2012 0545   CALCIUM 8.8 08/05/2012 0545   PROT 6.6 08/05/2012 0545   ALBUMIN 2.8* 08/05/2012 0545   AST 29 08/05/2012 0545   ALT 12 08/05/2012 0545   ALKPHOS 68 08/05/2012 0545   BILITOT 0.3 08/05/2012 0545   GFRNONAA 31* 08/05/2012 0545   GFRAA 36* 08/05/2012 0545   Lab Results  Component Value Date   HGBA1C 11.6* 09/29/2009   Lab Results  Component Value Date   MICROALBUR  Value: 39.18 (NOTE) Result repeated and verified. Result confirmed by automatic dilution.* 09/29/2009   CREATININE 1.84* 08/05/2012    Intake:   Intake/Output Summary (Last 24 hours) at 08/05/12 1411 Last data  filed at 08/05/12 1300  Gross per 24 hour  Intake   3325 ml  Output   1475 ml  Net   1850 ml    Diet Order: Carb Control  Supplements/Tube Feeding: none at this time  IVF:    0.9 % NaCl with KCl 20 mEq / L Last Rate: 100 mL/hr at 08/05/12 1300  DISCONTD: sodium chloride Last Rate: 75 mL/hr at 08/04/12 1423    Estimated Nutritional Needs:   Kcal:1531-1670 kcals daily Protein:55-68 grams protein daily Fluid:1.5-1.7 L fluid daily  NUTRITION DIAGNOSIS: -Inadequate oral intake (NI-2.1).  Status: Ongoing  RELATED TO: hx of poor intake and weight loss  AS EVIDENCE BY: 7# wt loss x 7 months.   MONITORING/EVALUATION(Goals): Weight, PO intake, labs, changes in status. 1) Pt will meet >75% of estimated energy and protein needs 2) Pt will maintain current wt of 150#  EDUCATION NEEDS: -Education not appropriate at this  time  Dietitian #: [phone]  DOCUMENTATION CODES Per approved criteria  -Not Applicable    Melody Haver, RD, LDN 08/05/2012, 1:53 PM

## 2012-08-06 ENCOUNTER — Inpatient Hospital Stay (HOSPITAL_COMMUNITY): Payer: Medicare Other

## 2012-08-06 LAB — URINE CULTURE

## 2012-08-06 LAB — CBC
Hemoglobin: 9.6 g/dL — ABNORMAL LOW (ref 13.0–17.0)
RBC: 3.46 MIL/uL — ABNORMAL LOW (ref 4.22–5.81)

## 2012-08-06 LAB — BASIC METABOLIC PANEL
Chloride: 102 mEq/L (ref 96–112)
GFR calc Af Amer: 47 mL/min — ABNORMAL LOW (ref >90)
Potassium: 3.5 mEq/L (ref 3.5–5.1)

## 2012-08-06 LAB — GLUCOSE, CAPILLARY
Glucose-Capillary: 167 mg/dL — ABNORMAL HIGH (ref 70–99)
Glucose-Capillary: 223 mg/dL — ABNORMAL HIGH (ref 70–99)

## 2012-08-06 MED ORDER — HYDRALAZINE HCL 20 MG/ML IJ SOLN
5.0000 mg | Freq: Once | INTRAMUSCULAR | Status: AC
  Administered 2012-08-06: 5 mg via INTRAVENOUS
  Filled 2012-08-06: qty 1

## 2012-08-06 MED ORDER — CLONIDINE HCL 0.1 MG PO TABS
0.1000 mg | ORAL_TABLET | ORAL | Status: DC | PRN
  Administered 2012-08-06: 0.1 mg via ORAL
  Filled 2012-08-06: qty 1

## 2012-08-06 MED ORDER — CHLORHEXIDINE GLUCONATE 0.12 % MT SOLN
15.0000 mL | Freq: Two times a day (BID) | OROMUCOSAL | Status: DC
  Administered 2012-08-06: 15 mL via OROMUCOSAL
  Filled 2012-08-06: qty 15

## 2012-08-06 MED ORDER — SODIUM CHLORIDE 0.9 % IJ SOLN
INTRAMUSCULAR | Status: AC
  Filled 2012-08-06: qty 3

## 2012-08-06 MED ORDER — BIOTENE DRY MOUTH MT LIQD
15.0000 mL | Freq: Two times a day (BID) | OROMUCOSAL | Status: DC
  Administered 2012-08-06 – 2012-08-07 (×4): 15 mL via OROMUCOSAL

## 2012-08-06 MED ORDER — SODIUM CHLORIDE 0.9 % IV SOLN
INTRAVENOUS | Status: DC
  Administered 2012-08-06: 15:00:00 via INTRAVENOUS
  Administered 2012-08-07: 75 mL/h via INTRAVENOUS
  Administered 2012-08-08: 09:00:00 via INTRAVENOUS

## 2012-08-06 NOTE — Clinical Social Work Psychosocial (Signed)
    Clinical Social Work Department BRIEF PSYCHOSOCIAL ASSESSMENT 08/06/2012  Patient:  [name], [name]     Account Number:  [id number]     Admit date:  08/04/2012  Clinical Social Worker:  Santa Genera, CLINICAL SOCIAL WORKER  Date/Time:  08/06/2012 01:00 PM  Referred by:  Physician  Date Referred:  08/06/2012 Referred for  SNF Placement   Other Referral:   Interview type:  Patient Other interview type:   Left messages for daughters:  [name] ([phone]) and [name] [phone])  Spoke w "adoptive goddaughter" on facesheet.    PSYCHOSOCIAL DATA Living Status:  ALONE Admitted from facility:   Level of care:   Primary support name:  [name] Primary support relationship to patient:  SITTER Degree of support available:   Ms. [name] and another person provide in home care for patient.    CURRENT CONCERNS Current Concerns  Post-Acute Placement   Other Concerns:    SOCIAL WORK ASSESSMENT / PLAN CSW met w patient at bedside, patient alert and oriented. Patient lives alone, has two in home caregivers who describe themselves as adopted goddaughters.  Spoke w [name] by phone, who said she has been caring for patient for a long time, and that he can be difficult to manage at times.  Family (two daughters) lives out of town - [place], [place] and [place].  Ms. [name] supplied numbers for both daughters, and messages were left for both.    Patient says he understands need to have rehab placement at discharge is he is not strong enough to return home, wants to discuss w his daughters when they visit on weekend.   Assessment/plan status:  Psychosocial Support/Ongoing Assessment of Needs Other assessment/ plan:   Information/referral to community resources:   SNF list for [facility]  SNF placement handbook    PATIENT'S/FAMILY'S RESPONSE TO PLAN OF CARE: Patient appreciative.  Santa Genera, LCSW Clinical Social Worker [phone])

## 2012-08-06 NOTE — Clinical Social Work Placement (Signed)
    Clinical Social Work Department CLINICAL SOCIAL WORK PLACEMENT NOTE 08/06/2012  Patient:  [name], [name]  Account Number:  [id number] Admit date:  08/04/2012  Clinical Social Worker:  Santa Genera, CLINICAL SOCIAL WORKER  Date/time:  08/06/2012 01:30 PM  Clinical Social Work is seeking post-discharge placement for this patient at the following level of care:   SKILLED NURSING   (*CSW will update this form in Epic as items are completed)   08/06/2012  Patient/family provided with [facility] Department of Clinical Social Work's list of facilities offering this level of care within the geographic area requested by the patient (or if unable, by the patient's family).  08/06/2012  Patient/family informed of their freedom to choose among providers that offer the needed level of care, that participate in Medicare, Medicaid or managed care program needed by the patient, have an available bed and are willing to accept the patient.  08/06/2012  Patient/family informed of [facility]' ownership interest in [facility], as well as of the fact that they are under no obligation to receive care at this facility.  PASARR submitted to [employer] on  PASARR number received from [employer] on   FL2 transmitted to all facilities in geographic area requested by pt/family on  08/06/2012 FL2 transmitted to all facilities within larger geographic area on   Patient informed that his/her managed care company has contracts with or will negotiate with  certain facilities, including the following:   NA     Patient/family informed of bed offers received:   Patient chooses bed at  Physician recommends and patient chooses bed at    Patient to be transferred to  on   Patient to be transferred to facility by   The following physician request were entered in Epic:   Additional Comments: Patient has previous PASARR  Santa Genera, LCSW Clinical Social Worker [phone])

## 2012-08-06 NOTE — Progress Notes (Signed)
[facility] PROGRESS NOTE  [name] JXB:[id number] DOB: 07/28/1920 DOA: 08/04/2012 PCP: Evlyn Courier, MD  Assessment/Plan: Principal Problem:  *Sepsis Active Problems:  UTI (urinary tract infection)  Dehydration  MI, acute, non ST segment elevation  Acute renal failure  Hyponatremia  Hypokalemia  Anemia  HTN (hypertension)  Gout  1. Sepsis. Blood cultures have shown no growth. Patient continues to have fevers. Will perform CT of the chest abdomen and pelvis to rule out any other underlying occult infection. Continue current antibiotics. 2. Urinary tract infection with Escherichia coli. Patient is on Rocephin. Sensitivities on culture are pending. 3. Acute renal failure secondary to dehydration. Mild improvement of creatinine with IV fluids. Continue current treatments and follow renal function. ACE inhibitor on hold 4. Acute MI Type 2. Patient has ruled in for acute MI with positive troponins. This is likely secondary to demand ischemia. 2-D echocardiogram has been ordered. Appreciate cardiology input. Patient is on aspirin. 5. Hypokalemia, improved 6. Anemia, chronic, stable. serum iron is low, continue iron supplementation. 7. Generalized weakness. Physical therapy evaluation pending  Code Status: Full code Family Communication: Discussed with patient, left voicemail for daughter Stanton Kidney Disposition Plan: Pending further clinical improvement, may need placement in a skilled nursing facility if he qualifies   Brief narrative: This gentleman was brought to the emergency room with generalized weakness and fevers. He lives alone. He was found to have a urinary tract infection in the emergency room, was also in acute renal failure secondary to dehydration, and likely had underlying sepsis. Patient was admitted to the step down unit for further management.  Consultants:  Cardiology  Procedures:  none  Antibiotics:  rocephin  HPI/Subjective: Patient is somewhat  lethargic this morning. He is able to wake up and ask her questions appropriately. Denies any shortness of breath, abdominal pain, any other complaints.  Objective: Filed Vitals:   08/06/12 0600 08/06/12 0700 08/06/12 0748 08/06/12 0800  BP: 158/67 161/65  164/75  Pulse: 104 104    Temp:   101.1 F (38.4 C)   TempSrc:   Oral   Resp: 34 34  24  Height:      Weight:      SpO2: 94% 94%  96%    Intake/Output Summary (Last 24 hours) at 08/06/12 1048 Last data filed at 08/06/12 0900  Gross per 24 hour  Intake 2461.5 ml  Output   2250 ml  Net  211.5 ml   Filed Weights   08/04/12 1246 08/04/12 1720 08/05/12 0536  Weight: 68.04 kg (150 lb) 66.9 kg (147 lb 7.8 oz) 68.2 kg (150 lb 5.7 oz)    Exam:   General:  NAD  Cardiovascular: s1, s2, rrr, no pedal edema  Respiratory: cta b  Abdomen: soft, nt, bs+  Data Reviewed: Basic Metabolic Panel:  Lab 08/06/12 1308 08/05/12 0545 08/04/12 1345  NA 134* 134* 129*  K 3.5 3.4* 3.3*  CL 102 101 96  CO2 18* 20 24  GLUCOSE 154* 121* 204*  BUN 35* 37* 40*  CREATININE 1.47* 1.84* 2.02*  CALCIUM 9.1 8.8 9.4  MG -- 1.9 --  PHOS -- -- --   Liver Function Tests:  Lab 08/05/12 0545 08/04/12 1345  AST 29 27  ALT 12 10  ALKPHOS 68 61  BILITOT 0.3 0.3  PROT 6.6 6.6  ALBUMIN 2.8* 2.9*    Lab 08/04/12 1345  LIPASE 36  AMYLASE --   No results found for this basename: AMMONIA:5 in the last 168 hours CBC:  Lab 08/06/12 0135 08/05/12 0545 08/04/12 1345  WBC 6.4 6.8 9.5  NEUTROABS -- -- 7.7  HGB 9.6* 9.0* 8.9*  HCT 29.5* 27.3* 26.3*  MCV 85.3 85.3 85.9  PLT 154 152 174   Cardiac Enzymes:  Lab 08/05/12 0545 08/05/12 0051 08/04/12 1832 08/04/12 1345  CKTOTAL -- -- -- --  CKMB -- -- -- --  CKMBINDEX -- -- -- --  TROPONINI 0.61* 0.94* 0.88* 1.24*   BNP (last 3 results)  Basename 08/05/12 0545  PROBNP 981.0*   CBG:  Lab 08/06/12 0732 08/05/12 2058 08/05/12 1639 08/05/12 1156 08/05/12 0803  GLUCAP 167* 120* 286* 156*  124*    Recent Results (from the past 240 hour(s))  URINE CULTURE     Status: Normal (Preliminary result)   Collection Time   08/04/12  3:00 PM      Component Value Range Status Comment   Specimen Description URINE, CATHETERIZED   Final    Special Requests NONE   Final    Culture  Setup Time 08/04/2012 23:28   Final    Colony Count >=100,000 COLONIES/ML   Final    Culture ESCHERICHIA COLI   Final    Report Status PENDING   Incomplete   CULTURE, BLOOD (ROUTINE X 2)     Status: Normal (Preliminary result)   Collection Time   08/04/12  4:20 PM      Component Value Range Status Comment   Specimen Description BLOOD LEFT ARM   Final    Special Requests     Final    Value: BOTTLES DRAWN AEROBIC AND ANAEROBIC 6CC EACH BOTTLE   Culture NO GROWTH 2 DAYS   Final    Report Status PENDING   Incomplete   CULTURE, BLOOD (ROUTINE X 2)     Status: Normal (Preliminary result)   Collection Time   08/04/12  4:45 PM      Component Value Range Status Comment   Specimen Description BLOOD LEFT HAND   Final    Special Requests BOTTLES DRAWN AEROBIC ONLY 6CC BOTTLE   Final    Culture NO GROWTH 2 DAYS   Final    Report Status PENDING   Incomplete   MRSA PCR SCREENING     Status: Normal   Collection Time   08/04/12  5:20 PM      Component Value Range Status Comment   MRSA by PCR NEGATIVE  NEGATIVE Final      Studies: Dg Chest 2 View  08/04/2012  *RADIOLOGY REPORT*  Clinical Data:  Cough, evaluate for infiltrate, CHF, free air  CHEST - 2 VIEW  Comparison: Most recent prior chest x-ray 09/26/2009  Findings: Lungs are negative for pulmonary edema, or focal airspace consolidation.  No pneumothorax, or pleural effusion.  Cardiac and mediastinal contours within normal limits.  There is atherosclerosis of the transverse aorta.  No acute osseous abnormality.  No free air under the diaphragm on the upright view. Contiguous flowing anterior osteophytes along the thoracic spine consistent with dystrophic idiopathic skeletal  hyperostosis (DISH)  IMPRESSION:  No acute cardiopulmonary disease  Aortic atherosclerosis   Original Report Authenticated By: [facility]    Portable Chest 1 View  08/05/2012  *RADIOLOGY REPORT*  Clinical Data: Sepsis, rule out pneumonia  PORTABLE CHEST - 1 VIEW  Comparison: 08/04/2012  Findings: Cardiomediastinal silhouette is stable.  No acute infiltrate or pulmonary edema.  Atherosclerotic calcifications of thoracic aorta.  Mild elevation of the right hemidiaphragm.  Mild basilar atelectasis.  IMPRESSION: No acute infiltrate  or pulmonary edema.  Atherosclerotic calcifications of thoracic aorta again noted.   Original Report Authenticated By: Natasha Mead, M.D.     Scheduled Meds:    . allopurinol  100 mg Oral Daily  . amLODipine  5 mg Oral NOW   Followed by  . amLODipine  10 mg Oral Daily  . antiseptic oral rinse  15 mL Mouth Rinse q12n4p  . aspirin EC  325 mg Oral Daily  . cefTRIAXone (ROCEPHIN)  IV  1 g Intravenous Q24H  . chlorhexidine  15 mL Mouth Rinse BID  . enoxaparin (LOVENOX) injection  30 mg Subcutaneous Q24H  . feeding supplement  237 mL Oral BID BM  . hydrALAZINE  5 mg Intravenous Once  . insulin aspart  0-15 Units Subcutaneous TID WC  . insulin aspart  0-5 Units Subcutaneous QHS  . insulin glargine  20 Units Subcutaneous q morning - 10a  . iron polysaccharides  150 mg Oral Daily  . naphazoline  1 drop Both Eyes Daily   Continuous Infusions:    . 0.9 % NaCl with KCl 20 mEq / L 10 mL/hr at 08/06/12 0900    Principal Problem:  *Sepsis Active Problems:  UTI (urinary tract infection)  Dehydration  MI, acute, non ST segment elevation  Acute renal failure  Hyponatremia  Hypokalemia  Anemia  HTN (hypertension)  Gout    Time spent:    Marketta Valadez  [facility] Pager [phone]. If 7PM-7AM, please contact night-coverage at [email or web address], password [facility] 08/06/2012, 10:48 AM  LOS: 2 days

## 2012-08-06 NOTE — Progress Notes (Signed)
SUBJECTIVE: Feeling better, breathing better, up walking in the hall with physical therapist, main concern today is that he is losing weight. Requests medication to help him gain weight. Denies chest pain.  Principal Problem:  *Sepsis Active Problems:  UTI (urinary tract infection)  Dehydration  MI, acute, non ST segment elevation  Acute renal failure  Hyponatremia  Hypokalemia  Anemia  HTN (hypertension)  Gout   LABS: Basic Metabolic Panel:  Basename 08/06/12 0134 08/05/12 0545  NA 134* 134*  K 3.5 3.4*  CL 102 101  CO2 18* 20  GLUCOSE 154* 121*  BUN 35* 37*  CREATININE 1.47* 1.84*  CALCIUM 9.1 8.8  MG -- 1.9  PHOS -- --   Liver Function Tests:  Basename 08/05/12 0545 08/04/12 1345  AST 29 27  ALT 12 10  ALKPHOS 68 61  BILITOT 0.3 0.3  PROT 6.6 6.6  ALBUMIN 2.8* 2.9*    Basename 08/04/12 1345  LIPASE 36  AMYLASE --   CBC:  Basename 08/06/12 0135 08/05/12 0545 08/04/12 1345  WBC 6.4 6.8 --  NEUTROABS -- -- 7.7  HGB 9.6* 9.0* --  HCT 29.5* 27.3* --  MCV 85.3 85.3 --  PLT 154 152 --   Cardiac Enzymes:  Basename 08/05/12 0545 08/05/12 0051 08/04/12 1832  CKTOTAL -- -- --  CKMB -- -- --  CKMBINDEX -- -- --  TROPONINI 0.61* 0.94* 0.88*   Hemoglobin A1C:  Basename 08/05/12 0545  HGBA1C 7.1*   Thyroid Function Tests:  Basename 08/05/12 0545  TSH 0.838  T4TOTAL --  T3FREE --  THYROIDAB --    RADIOLOGY: Dg Chest 2 View  08/04/2012  *RADIOLOGY REPORT*  Clinical Data:  Cough, evaluate for infiltrate, CHF, free air  CHEST - 2 VIEW  Comparison: Most recent prior chest x-ray 09/26/2009  Findings: Lungs are negative for pulmonary edema, or focal airspace consolidation.  No pneumothorax, or pleural effusion.  Cardiac and mediastinal contours within normal limits.  There is atherosclerosis of the transverse aorta.  No acute osseous abnormality.  No free air under the diaphragm on the upright view. Contiguous flowing anterior osteophytes along the thoracic  spine consistent with dystrophic idiopathic skeletal hyperostosis (DISH)  IMPRESSION:  No acute cardiopulmonary disease  Aortic atherosclerosis   Original Report Authenticated By: [facility]    Portable Chest 1 View  08/05/2012  *RADIOLOGY REPORT*  Clinical Data: Sepsis, rule out pneumonia  PORTABLE CHEST - 1 VIEW  Comparison: 08/04/2012  Findings: Cardiomediastinal silhouette is stable.  No acute infiltrate or pulmonary edema.  Atherosclerotic calcifications of thoracic aorta.  Mild elevation of the right hemidiaphragm.  Mild basilar atelectasis.  IMPRESSION: No acute infiltrate or pulmonary edema.  Atherosclerotic calcifications of thoracic aorta again noted.   Original Report Authenticated By: Natasha Mead, M.D.    Echocardigram:08/05/2012 Left ventricle: The cavity size was normal. There was mild concentric hypertrophy. Systolic function was normal. The estimated ejection fraction was in the range of 55% to 60%. Wall motion was normal; there were no regional wall motion abnormalities. - Aortic valve: Mildly to moderately calcified annulus. Trileaflet; moderately thickened, moderately calcified leaflets. There was mild to moderate stenosis. Valve area: 1.21cm^2(VTI). Valve area: 1.18cm^2 (Vmax). - Left atrium: The atrium was mildly dilated. - Atrial septum: No defect or patent foramen ovale was identified.    PHYSICAL EXAM BP 164/75  Pulse 104  Temp 101.1 F (38.4 C) (Oral)  Resp 24  Ht 5\' 7"  (1.702 m)  Wt 150 lb 5.7 oz (68.2 kg)  BMI  23.55 kg/m2  SpO2 96% General: Well developed, well nourished, in no acute distress Head: Eyes PERRLA, No xanthomas.   Normal cephalic and atramatic  Lungs: Essentially clear, I am hearing crackles in the left base, he has occasional cough with inspiration. Heart: HRRR S1 S2, 2/6 holosystolic murmur, best heard at the RSB with radiation to the apex.  No MRG .  Pulses are 2+ & equal.            No carotid bruit. No JVD.  No abdominal bruits. No femoral  bruits. Abdomen: Bowel sounds are positive, abdomen soft and non-tender without masses or                  Hernia's noted. Msk:  Back normal, normal gait. Normal strength and tone for age. Extremities: No clubbing, cyanosis or edema.  DP +1 Neuro: Alert and oriented X 3. Psych:  Good affect, responds appropriately  TELEMETRY: Reviewed telemetry pt in normal sinus rhythm rate of 88 beats per minute  ASSESSMENT AND PLAN:  1.NSTEMI-Type II: Troponins trending downward. Echocardiogram reveals normal LV systolic function without wall motion abnormalities. Aortic valve does have some calcification. Would not recommend proceeding with a cardiac catheterization in the setting of renal insufficiency, and a now 1.47; with normal LV function with no wall motion abnormalities. Troponin elevation is likely related to acute illness in the setting of sepsis. He does have some abnormalities on his EKG without evidence of acute coronary syndrome.  2. Hypertension: Blood pressure is moderately elevated. Plan to increase Norvasc to 10 mg daily. As was increased this a.m. When you to monitor patient's response.  Bettey Mare. Lyman Bishop NP [facility] 08/06/2012, 11:17 AM

## 2012-08-06 NOTE — Progress Notes (Signed)
Hypertension. Notified Dr. Orvan Falconer, multiple therapies attempted.  At this present time patient remains 191/76. Resting comfortably in bed.  IVF to [facility]. 0.1mg  Clonidine just administered.  Am monitoring closely.  Patient remains cooperative. Mild headache rated 2, denies need for tylenol.

## 2012-08-06 NOTE — Progress Notes (Signed)
Physical Therapy Treatment Patient Details Name: [name] MRN: [id number] DOB: Jun 05, 1920 Today's Date: 08/06/2012 Time: 4098-1191 PT Time Calculation (min): 27 min 1 therex 1 gt  PT Assessment / Plan / Recommendation Comments on Treatment Session  patient completed 110' of gait training with RW: Min guard. No LOB. Patient moving slowly but able to carry on conversation while ambulatiing. Patient also states he does his leg exercise daily while in bed;today he completed exc at bedside without difficulty.    Follow Up Recommendations       Barriers to Discharge        Equipment Recommendations       Recommendations for Other Services    Frequency     Plan      Precautions / Restrictions Restrictions Weight Bearing Restrictions: No   Pertinent Vitals/Pain     Mobility  Bed Mobility Bed Mobility: Sitting - Scoot to Edge of Bed Supine to Sit: 6: Modified independent (Device/Increase time) Sitting - Scoot to Edge of Bed: 6: Modified independent (Device/Increase time) Transfers Sit to Stand: 4: Min guard Stand to Sit: 4: Min guard Ambulation/Gait Ambulation/Gait Assistance: 4: Min guard Ambulation Distance (Feet): 110 Feet Assistive device: Rolling walker Ambulation/Gait Assistance Details: vebal cueing for keeping head up    Exercises General Exercises - Lower Extremity Long Arc Quad: 20 reps;Both;Seated Hip ABduction/ADduction: Seated;10 reps;Both (both ABD/ADD; resisted)   PT Diagnosis:    PT Problem List:   PT Treatment Interventions:     PT Goals    Visit Information  Last PT Received On: 08/06/12    Subjective Data      Cognition       Balance     End of Session PT - End of Session Equipment Utilized During Treatment: Gait belt Activity Tolerance: Patient tolerated treatment well Patient left: in bed;with nursing in room Nurse Communication: Mobility status   GP     Irl Bodie ATKINSO 08/06/2012, 11:25 AM

## 2012-08-07 DIAGNOSIS — I359 Nonrheumatic aortic valve disorder, unspecified: Secondary | ICD-10-CM

## 2012-08-07 DIAGNOSIS — I350 Nonrheumatic aortic (valve) stenosis: Secondary | ICD-10-CM | POA: Diagnosis present

## 2012-08-07 DIAGNOSIS — I219 Acute myocardial infarction, unspecified: Secondary | ICD-10-CM

## 2012-08-07 DIAGNOSIS — R7989 Other specified abnormal findings of blood chemistry: Secondary | ICD-10-CM

## 2012-08-07 LAB — BASIC METABOLIC PANEL
BUN: 32 mg/dL — ABNORMAL HIGH (ref 6–23)
CO2: 22 mEq/L (ref 19–32)
Chloride: 108 mEq/L (ref 96–112)
Glucose, Bld: 125 mg/dL — ABNORMAL HIGH (ref 70–99)
Potassium: 3.5 mEq/L (ref 3.5–5.1)

## 2012-08-07 LAB — GLUCOSE, CAPILLARY
Glucose-Capillary: 99 mg/dL (ref 70–99)
Glucose-Capillary: 149 mg/dL — ABNORMAL HIGH (ref 70–99)
Glucose-Capillary: 152 mg/dL — ABNORMAL HIGH (ref 70–99)

## 2012-08-07 LAB — CBC
HCT: 24.9 % — ABNORMAL LOW (ref 39.0–52.0)
Hemoglobin: 8.2 g/dL — ABNORMAL LOW (ref 13.0–17.0)
MCV: 84.1 fL (ref 78.0–100.0)
WBC: 3.7 10*3/uL — ABNORMAL LOW (ref 4.0–10.5)

## 2012-08-07 MED ORDER — BIOTENE DRY MOUTH MT LIQD
15.0000 mL | Freq: Two times a day (BID) | OROMUCOSAL | Status: DC
  Administered 2012-08-08: 15 mL via OROMUCOSAL

## 2012-08-07 MED ORDER — ENOXAPARIN SODIUM 40 MG/0.4ML ~~LOC~~ SOLN
40.0000 mg | SUBCUTANEOUS | Status: DC
  Administered 2012-08-07: 40 mg via SUBCUTANEOUS
  Filled 2012-08-07: qty 0.4

## 2012-08-07 MED ORDER — DIPHENHYDRAMINE HCL 25 MG PO CAPS: 25.0000 mg | ORAL_CAPSULE | Freq: Four times a day (QID) | ORAL | Status: DC | PRN

## 2012-08-07 MED ORDER — CLONIDINE HCL 0.1 MG PO TABS
0.1000 mg | ORAL_TABLET | Freq: Every day | ORAL | Status: DC
  Administered 2012-08-07 – 2012-08-08 (×2): 0.1 mg via ORAL
  Filled 2012-08-07 (×2): qty 1

## 2012-08-07 MED ORDER — METOPROLOL TARTRATE 25 MG PO TABS
25.0000 mg | ORAL_TABLET | Freq: Two times a day (BID) | ORAL | Status: DC
  Administered 2012-08-07 – 2012-08-08 (×3): 25 mg via ORAL
  Filled 2012-08-07 (×3): qty 1

## 2012-08-07 MED ORDER — DIPHENHYDRAMINE HCL 25 MG PO CAPS
ORAL_CAPSULE | ORAL | Status: AC
  Administered 2012-08-07: 25 mg
  Filled 2012-08-07: qty 1

## 2012-08-07 NOTE — Progress Notes (Signed)
OT Cancellation Note  Treatment cancelled today due to patient sleeping at this time.   Patient refused to participate in OT eval at this time. Will re-attempt when appropriate.  Limmie Patricia, OTR/L 08/07/2012, 2:42 PM

## 2012-08-07 NOTE — Progress Notes (Signed)
Pt. Received to room 313 via W/C, no problems voiced from pt., pt. Settled in bed, lights out per pt. Request.

## 2012-08-07 NOTE — Clinical Social Work Note (Signed)
Spoke w D Chaplin by phone to update on bed offers.  Daughter will discuss w sister and call CSW this afternoon.  Daughter wonders if patient needs cane and asks that PT make recommendation for equipment patient might need to assist w walking.  Santa Genera, LCSW Clinical Social Worker [phone])

## 2012-08-07 NOTE — Progress Notes (Signed)
Physical Therapy Treatment Patient Details Name: Candler Denger MRN: [id number] DOB: 1920/07/10 Today's Date: 08/07/2012 Time:  -     PT Assessment / Plan / Recommendation  Attempted therapy this morning;patient refused due to being exhausted. Patient stated that he had a "choking episode" while eating breakfast this morning and then vomited and had to be bathed and bed changed which required him to be "up and down and bending over and getting up several times". Will attempt again when appropriate.     Oralee Rapaport ATKINSO 08/07/2012, 11:12 AM

## 2012-08-07 NOTE — Progress Notes (Signed)
Report given to Darel Hong, RN on Dept 300.  Pt is A&O, VSS; pt will transfer to room 313 via wheelchair and will be placed on telemetry.  Pt's belongings taken with him to room 313 and Dondra Spry (friend) notified.

## 2012-08-07 NOTE — Clinical Social Work Note (Signed)
Pt now stating he will make decision regarding which bed to accept tomorrow morning as he wants to think about it further tonight. CSW will follow up in AM.  Derenda Fennel, LCSW [phone]

## 2012-08-07 NOTE — Progress Notes (Signed)
Subjective:  Feeling better, no further chest pain, breathing normal.  Objective:  Vital Signs in the last 24 hours: Temp:  [98 F (36.7 C)-100.3 F (37.9 C)] 98.2 F (36.8 C) (09/11 0800) Pulse Rate:  [80-93] 86  (09/11 0200) Resp:  [15-26] 20  (09/11 0800) BP: (120-206)/(43-76) 206/69 mmHg (09/11 0800) SpO2:  [93 %-99 %] 97 % (09/10 2000)  Intake/Output from previous day: 09/10 0701 - 09/11 0700 In: 1956.8 [P.O.:660; I.V.:1246.8; IV Piggyback:50] Out: 1400 [Urine:1400] Intake/Output from this shift: Total I/O In: 317 [P.O.:240; I.V.:75; IV Piggyback:2] Out: 225 [Urine:225]  Physical Exam: NECK: Without JVD, HJR, or bruit LUNGS: Decreased breath sounds but Clear anterior, posterior, lateral HEART: Regular rate and rhythm, harsh 2-3/6 systolic murmur LSB EXTREMITIES: Without cyanosis, clubbing, or edema   Basename 08/07/12 0427 08/06/12 0135  WBC 3.7* 6.4  HGB 8.2* 9.6*  PLT 167 154    Basename 08/07/12 0427 08/06/12 0134  NA 139 134*  K 3.5 3.5  CL 108 102  CO2 22 18*  GLUCOSE 125* 154*  BUN 32* 35*  CREATININE 1.38* 1.47*    Basename 08/05/12 0545 08/05/12 0051  TROPONINI 0.61* 0.94*   Hepatic Function Panel  Basename 08/05/12 0545  PROT 6.6  ALBUMIN 2.8*  AST 29  ALT 12  ALKPHOS 68  BILITOT 0.3  BILIDIR --  IBILI --   Echocardigram:08/05/2012 Left ventricle: size was normal; mild concentric hypertrophy. EF 55%-60%. no regional wall motion abnormalities. - Aortic valve: Mildly to moderately calcified annulus. Trileaflet; moderately thickened, moderately calcified leaflets. There was mild to moderate stenosis. Valve area: 1.21cm^2(VTI). Valve area: 1.18cm^2 (Vmax). Left atrium: mildly dilated.  Assessment/Plan:  1.NSTEMI-Type II: Troponins trending downward. Echocardiogram reveals normal LV systolic function without wall motion abnormalities. Mild to moderate AS also present.  Would not recommend proceeding with a cardiac catheterization given  age and renal insufficiency, creat-1.38.  Troponin elevation is likely related to physiologic stress in the setting of sepsis. He does have some abnormalities on his EKG without evidence of acute coronary syndrome.  2. Hypertension: Blood pressure is moderately elevated, but improving. Norvasc increased to 10 mg daily yesterday. Will resume clonidine 0.1mg .  Continue to adjust/add antihypertensive meds as needed.  3. UTI with possible sepsis:  Jacolyn Reedy 08/07/2012, 9:04 AM  Cardiology Attending Patient interviewed and examined. Discussed with Jacolyn Reedy, PA-C.  Above note annotated and modified based upon my findings.  No further cardiac testing or treatment, other than controlled hypertension, is required during this hospitalization.  [place] Bing, MD 08/07/2012, 6:49 PM

## 2012-08-07 NOTE — Progress Notes (Signed)
UR Chart Review Completed  

## 2012-08-07 NOTE — Clinical Social Work Note (Signed)
CSW received call from pt's daughter [name] stating that pt and family would like to accept bed at [facility]. Voicemail left for Debbie at facility. CSW to continue to follow.   Derenda Fennel, [place] [phone]

## 2012-08-07 NOTE — Progress Notes (Signed)
[facility] PROGRESS NOTE  [name] JXB:[id number] DOB: July 13, 1920 DOA: 08/04/2012 PCP: Evlyn Courier, MD  Assessment/Plan: Principal Problem:  *Sepsis Active Problems:  UTI (urinary tract infection)  Dehydration  MI, acute, non ST segment elevation  Acute renal failure  Hyponatremia  Hypokalemia  Anemia  HTN (hypertension)  Gout  1. Sepsis. Blood cultures have shown no growth. CT chest/abd/pelvis did not show any acute findings.  Fevers have resolved overnight.  Continue current treatments. 2. Urinary tract infection with Escherichia coli. Patient is on Rocephin. Bacteria is pen sensitive.  Transition to po as tolerated. 3. Acute renal failure secondary to dehydration. Mild improvement of creatinine with IV fluids. Continue current treatments and follow renal function. ACE inhibitor on hold 4. Acute MI Type 2. Patient has ruled in for acute MI with positive troponins. This is likely secondary to demand ischemia. 2-D echocardiogram shows no wall motion abnormalities. Appreciate cardiology input. Patient is on aspirin. 5. HTN.  Start betablocker, continue norvasc 6. Hypokalemia, improved 7. Anemia, chronic, stable. serum iron is low, increase iron supplementation. 8. Generalized weakness. Will need SNF placement  Code Status: Full code Family Communication: Discussed with patient, left voicemail for daughter [facility] Disposition Plan: will need SNF placement.  CSW is involved. Transfer to telemetry   Brief narrative: This gentleman was brought to the emergency room with generalized weakness and fevers. He lives alone. He was found to have a urinary tract infection in the emergency room, was also in acute renal failure secondary to dehydration, and likely had underlying sepsis. Patient was admitted to the step down unit for further management.  Consultants:  Cardiology  Procedures:  none  Antibiotics:  rocephin  HPI/Subjective: He is complaining of an itch on his  back.  No shortness of breath, cough or chest pain.  No other complaints.  Feels weak.  Objective: Filed Vitals:   08/07/12 0500 08/07/12 0600 08/07/12 0700 08/07/12 0800  BP: 171/66 157/66 184/67 206/69  Pulse:      Temp:    98.2 F (36.8 C)  TempSrc:    Oral  Resp: 18 20 21 20   Height:      Weight:      SpO2:        Intake/Output Summary (Last 24 hours) at 08/07/12 1023 Last data filed at 08/07/12 0840  Gross per 24 hour  Intake 1804.5 ml  Output   1425 ml  Net  379.5 ml   Filed Weights   08/04/12 1246 08/04/12 1720 08/05/12 0536  Weight: 68.04 kg (150 lb) 66.9 kg (147 lb 7.8 oz) 68.2 kg (150 lb 5.7 oz)    Exam:   General:  NAD  Cardiovascular: s1, s2, rrr, no pedal edema  Respiratory: cta b  Abdomen: soft, nt, bs+  Data Reviewed: Basic Metabolic Panel:  Lab 08/07/12 1308 08/06/12 0134 08/05/12 0545 08/04/12 1345  NA 139 134* 134* 129*  K 3.5 3.5 3.4* 3.3*  CL 108 102 101 96  CO2 22 18* 20 24  GLUCOSE 125* 154* 121* 204*  BUN 32* 35* 37* 40*  CREATININE 1.38* 1.47* 1.84* 2.02*  CALCIUM 9.1 9.1 8.8 9.4  MG -- -- 1.9 --  PHOS -- -- -- --   Liver Function Tests:  Lab 08/05/12 0545 08/04/12 1345  AST 29 27  ALT 12 10  ALKPHOS 68 61  BILITOT 0.3 0.3  PROT 6.6 6.6  ALBUMIN 2.8* 2.9*    Lab 08/04/12 1345  LIPASE 36  AMYLASE --   No results  found for this basename: AMMONIA:5 in the last 168 hours CBC:  Lab 08/07/12 0427 08/06/12 0135 08/05/12 0545 08/04/12 1345  WBC 3.7* 6.4 6.8 9.5  NEUTROABS -- -- -- 7.7  HGB 8.2* 9.6* 9.0* 8.9*  HCT 24.9* 29.5* 27.3* 26.3*  MCV 84.1 85.3 85.3 85.9  PLT 167 154 152 174   Cardiac Enzymes:  Lab 08/05/12 0545 08/05/12 0051 08/04/12 1832 08/04/12 1345  CKTOTAL -- -- -- --  CKMB -- -- -- --  CKMBINDEX -- -- -- --  TROPONINI 0.61* 0.94* 0.88* 1.24*   BNP (last 3 results)  Basename 08/05/12 0545  PROBNP 981.0*   CBG:  Lab 08/07/12 0739 08/06/12 2108 08/06/12 1655 08/06/12 1139 08/06/12 0732  GLUCAP  99 174* 223* 187* 167*    Recent Results (from the past 240 hour(s))  URINE CULTURE     Status: Normal   Collection Time   08/04/12  3:00 PM      Component Value Range Status Comment   Specimen Description URINE, CATHETERIZED   Final    Special Requests NONE   Final    Culture  Setup Time 08/04/2012 23:28   Final    Colony Count >=100,000 COLONIES/ML   Final    Culture ESCHERICHIA COLI   Final    Report Status 08/06/2012 FINAL   Final    Organism ID, Bacteria ESCHERICHIA COLI   Final   CULTURE, BLOOD (ROUTINE X 2)     Status: Normal (Preliminary result)   Collection Time   08/04/12  4:20 PM      Component Value Range Status Comment   Specimen Description BLOOD LEFT ARM   Final    Special Requests     Final    Value: BOTTLES DRAWN AEROBIC AND ANAEROBIC 6CC EACH BOTTLE   Culture NO GROWTH 3 DAYS   Final    Report Status PENDING   Incomplete   CULTURE, BLOOD (ROUTINE X 2)     Status: Normal (Preliminary result)   Collection Time   08/04/12  4:45 PM      Component Value Range Status Comment   Specimen Description BLOOD LEFT HAND   Final    Special Requests BOTTLES DRAWN AEROBIC ONLY 6CC BOTTLE   Final    Culture NO GROWTH 3 DAYS   Final    Report Status PENDING   Incomplete   MRSA PCR SCREENING     Status: Normal   Collection Time   08/04/12  5:20 PM      Component Value Range Status Comment   MRSA by PCR NEGATIVE  NEGATIVE Final      Studies: Ct Abdomen Pelvis Wo Contrast  08/06/2012  *RADIOLOGY REPORT*  Clinical Data:  Fever, generalized weakness, weight loss, chest pain, dyspnea on exertion, not this is, abdominal pain and hematuria.  History of prostate carcinoma.  CT CHEST, ABDOMEN AND PELVIS WITHOUT CONTRAST  Technique:  Multidetector CT imaging of the chest, abdomen and pelvis was performed following the standard protocol with oral contrast but without IV contrast.  Comparison:  01/04/2009  CT CHEST  Findings:  The heart is normal in size.  There are dense coronary artery  calcifications and calcifications along the thoracic aorta and at the origins of the branch vessels.  There are sub centimeter mediastinal lymph nodes.  No mediastinal masses or pathologically enlarged lymph nodes.  The great vessels are normal in caliber.  No hilar masses or adenopathy.  Small right pleural effusion.  There is mild dependent subsegmental  atelectasis and thickening of the inferior oblique fissures.  There is some mild right lower lobe atelectasis adjacent to the oblique fissure inferiorly.  The lungs are otherwise clear.  There are degenerative changes of the thoracic spine.  No osteoblastic or osteolytic lesions.  IMPRESSION: No acute findings.  Dense coronary artery calcifications.  Small right pleural effusion.  Mild lung base subsegmental atelectasis. No evidence of malignancy and no infiltrate or edema.  CT ABDOMEN AND PELVIS  Findings:  Liver shows small low density lesions, the largest in the posterior segment of the right lobe measuring 14.5 mm.  These are consistent with cysts.  A small calcification lies at the dome of the liver consistent with healed granuloma.  Liver is otherwise unremarkable.  Normal spleen.  Normal gallbladder.  A stone projects in the vicinity of the confluence of the cystic duct or common duct. This may be vascular, but is nonspecific.  It is not felt to be a duct stone since it was present previously.  The pancreas is unremarkable.  No discrete adrenal masses.  The kidneys show no masses or stones or hydronephrosis.  There is mild bilateral renal cortical thinning and is presumed to be chronic, although is increased from the prior exam.  The ureters are normal course and caliber.  The bladder is unremarkable.  There are numerous vascular clips in the pelvis consistent with a previous radical prostatectomy.  A fat containing left inguinal hernia is incidentally noted.  There are no pelvic masses.  No pathologically enlarged lymph nodes in the no abnormal fluid  collections.  Air-fluid levels are noted throughout the colon as well as in the small bowel.  The stomach is moderately distended.  There is no bowel wall thickening or adjacent inflammatory changes.  A normal appendix is not visualized but there is no evidence of appendicitis.  The mesentery is unremarkable.  There is dense calcification along the abdominal aorta and its branch vessels.  No aneurysm.  There are changes from posterior spine surgery in the lower lumbar spine with spinous process resection.  There are changes throughout the visualized spine and involving the SI joints.  There are no osteoblastic or osteolytic lesions.  Findings are similar to the prior exam.  IMPRESSION: Air-fluid levels in the colon small bowel are nonspecific.  This could reflect a gastroenteritis.  No bowel wall thickening or evidence of inflammation in the mesentery.  No other evidence of acute abnormality in the abdomen or pelvis. Chronic findings include liver cysts.  There is perinephric stranding that is presumed to be chronic.  There are chronic changes from a previous radical prostatectomy.  There are degenerative changes and postsurgical changes of the lumbar spine and dense aortoiliac vascular calcifications.  No findings to explain hematuria.   Original Report Authenticated By: Domenic Moras, M.D.     Scheduled Meds:    . allopurinol  100 mg Oral Daily  . amLODipine  10 mg Oral Daily  . antiseptic oral rinse  15 mL Mouth Rinse q12n4p  . aspirin EC  325 mg Oral Daily  . cefTRIAXone (ROCEPHIN)  IV  1 g Intravenous Q24H  . cloNIDine  0.1 mg Oral Daily  . diphenhydrAMINE      . enoxaparin (LOVENOX) injection  30 mg Subcutaneous Q24H  . feeding supplement  237 mL Oral BID BM  . insulin aspart  0-15 Units Subcutaneous TID WC  . insulin aspart  0-5 Units Subcutaneous QHS  . insulin glargine  20 Units Subcutaneous  q morning - 10a  . iron polysaccharides  150 mg Oral Daily  . naphazoline  1 drop Both Eyes  Daily  . sodium chloride      . DISCONTD: chlorhexidine  15 mL Mouth Rinse BID   Continuous Infusions:    . sodium chloride 75 mL/hr at 08/07/12 0800  . DISCONTD: 0.9 % NaCl with KCl 20 mEq / L Stopped (08/06/12 1225)    Principal Problem:  *Sepsis Active Problems:  UTI (urinary tract infection)  Dehydration  MI, acute, non ST segment elevation  Acute renal failure  Hyponatremia  Hypokalemia  Anemia  HTN (hypertension)  Gout    Time spent:    [facility]  [facility] Pager [phone]. If 7PM-7AM, please contact night-coverage at [email or web address], password [email or web address] 08/07/2012, 10:23 AM  LOS: 3 days

## 2012-08-07 NOTE — Clinical Social Work Note (Signed)
Patient given current bed offers from [facility], [facility], [facility].  [facility] has no male beds at this time.  Patient requested that I contact [facility] to ask about bed availability and recontact [facility] when ready for discharge, as patient does not want to go to any of the SNFs that have made offers at this point  Santa Genera, LCSW Clinical Social Worker [phone]) .

## 2012-08-08 LAB — BASIC METABOLIC PANEL
BUN: 27 mg/dL — ABNORMAL HIGH (ref 6–23)
CO2: 21 mEq/L (ref 19–32)
Calcium: 8.9 mg/dL (ref 8.4–10.5)
GFR calc non Af Amer: 44 mL/min — ABNORMAL LOW (ref >90)
Glucose, Bld: 87 mg/dL (ref 70–99)

## 2012-08-08 LAB — CBC
HCT: 24.7 % — ABNORMAL LOW (ref 39.0–52.0)
Hemoglobin: 7.9 g/dL — ABNORMAL LOW (ref 13.0–17.0)
MCH: 27.2 pg (ref 26.0–34.0)
MCHC: 32 g/dL (ref 30.0–36.0)
RBC: 2.9 MIL/uL — ABNORMAL LOW (ref 4.22–5.81)

## 2012-08-08 LAB — GLUCOSE, CAPILLARY
Glucose-Capillary: 87 mg/dL (ref 70–99)
Glucose-Capillary: 191 mg/dL — ABNORMAL HIGH (ref 70–99)

## 2012-08-08 MED ORDER — POLYSACCHARIDE IRON COMPLEX 150 MG PO CAPS: 150.0000 mg | ORAL_CAPSULE | Freq: Three times a day (TID) | ORAL | Status: DC

## 2012-08-08 MED ORDER — HYDRALAZINE HCL 25 MG PO TABS: 25.0000 mg | ORAL_TABLET | Freq: Three times a day (TID) | ORAL | Status: DC

## 2012-08-08 MED ORDER — INFLUENZA VIRUS VACC SPLIT PF IM SUSP
0.5000 mL | Freq: Once | INTRAMUSCULAR | Status: AC
  Administered 2012-08-08: 0.5 mL via INTRAMUSCULAR
  Filled 2012-08-08: qty 0.5

## 2012-08-08 MED ORDER — METOPROLOL TARTRATE 25 MG PO TABS: 50.0000 mg | ORAL_TABLET | Freq: Two times a day (BID) | ORAL | Status: DC

## 2012-08-08 MED ORDER — AMOXICILLIN-POT CLAVULANATE 875-125 MG PO TABS: 1.0000 | ORAL_TABLET | Freq: Two times a day (BID) | ORAL | Status: AC

## 2012-08-08 MED ORDER — AMLODIPINE BESYLATE 5 MG PO TABS: 10.0000 mg | ORAL_TABLET | Freq: Every day | ORAL | Status: DC

## 2012-08-08 MED ORDER — INSULIN GLARGINE 100 UNIT/ML ~~LOC~~ SOLN: 20.0000 [IU] | Freq: Every morning | SUBCUTANEOUS | Status: DC

## 2012-08-08 NOTE — Clinical Social Work Note (Signed)
CSW spoke w daughters Raheem Kolbe and Caesar Chestnut) who state that patient is declining SNF placement and wants to return home.  Daughters anticipate he will decline home health, but CSW will notify RN CM to assess for home health needs.  Per daughters, patient wants his pastor to pick him up from hospital and intends to have caregivers in place at home.  CSW will speak w patient directly and sign off if patient is indeed declining SNF placement.  Santa Genera, LCSW Clinical Social Worker [phone])

## 2012-08-08 NOTE — Clinical Social Work Placement (Signed)
Clinical Social Work Department CLINICAL SOCIAL WORK PLACEMENT NOTE 08/08/2012  Patient:  DESMAN, POLAK  Account Number:  [id number] Admit date:  08/04/2012  Clinical Social Worker:  Santa Genera, CLINICAL SOCIAL WORKER  Date/time:  08/06/2012 01:30 PM  Clinical Social Work is seeking post-discharge placement for this patient at the following level of care:   SKILLED NURSING   (*CSW will update this form in Epic as items are completed)   08/06/2012  Patient/family provided with [facility] Department of Clinical Social Work's list of facilities offering this level of care within the geographic area requested by the patient (or if unable, by the patient's family).  08/06/2012  Patient/family informed of their freedom to choose among providers that offer the needed level of care, that participate in Medicare, Medicaid or managed care program needed by the patient, have an available bed and are willing to accept the patient.  08/06/2012  Patient/family informed of [facility]' ownership interest in [facility], as well as of the fact that they are under no obligation to receive care at this facility.  PASARR submitted to EDS on  PASARR number received from EDS on   FL2 transmitted to all facilities in geographic area requested by pt/family on  08/06/2012 FL2 transmitted to all facilities within larger geographic area on   Patient informed that his/her managed care company has contracts with or will negotiate with  certain facilities, including the following:   NA     Patient/family informed of bed offers received:  08/08/2012 Patient chooses bed at [facility] [place] Physician recommends and patient chooses bed at  [facility] [place]  Patient to be transferred to [facility] [place] on  08/08/2012 Patient to be transferred to facility by family  The following physician request were entered in Epic:   Additional Comments: Patient has previous [address] [place] [phone]

## 2012-08-08 NOTE — Progress Notes (Signed)
Pt discharged to a SNF rehab facility [facility]. The Pt's family transferred him to the [facility] facility. The Pt's report and  Written instructions were given to the admitting nurse at [facility]. The pt was administered a flu shot before discharge.  Orson Ape D 08/08/2012

## 2012-08-08 NOTE — Progress Notes (Signed)
Physical Therapy Treatment Patient Details Name: [name] MRN: [id number] DOB: [birth year] Today's Date: 08/08/2012 Time: 7829-5621 PT Time Calculation (min): 42 min  PT Assessment / Plan / Recommendation Comments on Treatment Session  270' gait training with RW, min guard.  Pt with 2 LOB episodes while standing with no HHA requiring min assistance to regain balance, pt unable to reach outside of BOS without LOB episodes.  Pt able to pick up object from floor with min assistance for control.due to LE weakness.      Follow Up Recommendations       Barriers to Discharge        Equipment Recommendations       Recommendations for Other Services    Frequency     Plan      Precautions / Restrictions Precautions Precautions: Fall Restrictions Weight Bearing Restrictions: No       Mobility  Bed Mobility Bed Mobility: Sitting - Scoot to Edge of Bed Sitting - Scoot to Edge of Bed: 4: Min assist Transfers Transfers: Sit to Stand;Stand to Sit Sit to Stand: 4: Min guard Stand to Sit: 4: Min guard (cueing for hand placment) Ambulation/Gait Ambulation/Gait Assistance: 4: Min guard Ambulation Distance (Feet): 270 Feet Assistive device: Rolling walker Ambulation/Gait Assistance Details: vc-ing to look up and increase stride length Gait Pattern: Step-through pattern;Trunk flexed;Shuffle;Decreased stride length Gait velocity: slow Stairs: No    Exercises     PT Diagnosis:    PT Problem List:   PT Treatment Interventions:     PT Goals Acute Rehab PT Goals PT Goal: Supine/Side to Sit - Progress: Progressing toward goal PT Goal: Sit to Supine/Side - Progress: Progressing toward goal PT Goal: Sit to Stand - Progress: Progressing toward goal PT Goal: Ambulate - Progress: Progressing toward goal  Visit Information  Last PT Received On: 08/08/12    Subjective Data  Subjective: I'm ready to go home   Cognition  Overall Cognitive Status: Appears within functional limits for  tasks assessed/performed Arousal/Alertness: Awake/alert Orientation Level: Appears intact for tasks assessed Behavior During Session: [facility] for tasks performed    Balance  Static Standing Balance Static Standing - Level of Assistance: 4: Min assist (Able to stand w/o HHA, with 2 LOB, able to regain with min A) High Level Balance High Level Balance Activites: Side stepping;Sudden stops;Head turns High Level Balance Comments: side stepping and retro gait, lean outside BOS, pick up objects from floor  End of Session PT - End of Session Equipment Utilized During Treatment: Gait belt Activity Tolerance: Patient tolerated treatment well Patient left: in bed;with call bell/phone within reach;with nursing in room Nurse Communication: Mobility status   GP     Juel Burrow 08/08/2012, 11:55 AM

## 2012-08-08 NOTE — Care Management Note (Signed)
    Page 1 of 1   08/08/2012     2:20:25 PM   CARE MANAGEMENT NOTE 08/08/2012  Patient:  [name], [name]   Account Number:  [id number]  Date Initiated:  08/08/2012  Documentation initiated by:  Rosemary Holms  Subjective/Objective Assessment:   Pt admitted with UTI/sepsis/dementia/GI bleed. Lives at home alone. Spoke to pt at bedside. He refuses SNF or HH, adamantly.     Action/Plan:   Plan to DC home. CM provided number to pt for his minister to provide transportation.   Anticipated DC Date:  08/08/2012   Anticipated DC Plan:  HOME/SELF CARE  In-house referral  Clinical Social Worker      DC Planning Services  CM consult      Choice offered to / List presented to:             Status of service:  Completed, signed off Medicare Important Message given?   (If response is "NO", the following Medicare IM given date fields will be blank) Date Medicare IM given:   Date Additional Medicare IM given:    Discharge Disposition:  HOME/SELF CARE  Per UR Regulation:    If discussed at Long Length of Stay Meetings, dates discussed:    Comments:  08/08/12 Rosemary Holms RN BSN CM

## 2012-08-08 NOTE — Clinical Social Work Note (Signed)
Patient has bed offers from [facility], [facility], [facility] and [facility].  [name] is requiring that one of his caregivers be available to complete admission paperwork.  Daughters called to obtain family input, messages left for both.    Santa Genera, LCSW Clinical Social Worker [phone])

## 2012-08-08 NOTE — Clinical Social Work Note (Signed)
Pt has decided to go to SNF again. He accepts bed at [facility] and will transfer with family. D/C summary faxed to facility.  Derenda Fennel, [place] [phone]

## 2012-08-08 NOTE — Discharge Summary (Signed)
Physician Discharge Summary  [name] ION:[id number] DOB: Dec 05, 1919 DOA: 08/04/2012  PCP: Evlyn Courier, MD  Admit date: 08/04/2012 Discharge date: 08/08/2012  Recommendations for Outpatient Follow-up:  1. Patient will be discharged to SNF for rehab 2. Follow up with primary care doctor once discharged from rehab 3. Serum chemistry and CBC to be drawn in 1 week  Discharge Diagnoses:  Principal Problem:  *Sepsis Active Problems:  UTI (urinary tract infection)  Dehydration  MI, acute, non ST segment elevation, Type 2, due to demand ischemia  Acute renal failure  Hyponatremia  Hypokalemia  Anemia, iron deficiency, chronic  HTN (hypertension)  Gout  Aortic stenosis   Discharge Condition: improved  Diet recommendation: low salt, low calorie  Filed Weights   08/04/12 1246 08/04/12 1720 08/05/12 0536  Weight: 68.04 kg (150 lb) 66.9 kg (147 lb 7.8 oz) 68.2 kg (150 lb 5.7 oz)    History of present illness:  [name] is a 78 y.o. male who was brought to the emergency room with complaints of fever and generalized weakness. The history is obtained from the patient as well as his family friends at the bedside. The patient lives alone. He has 2 daughters who live out of state. Patient has been having worsening weakness over the past week or so. He's also had intermittent fevers. Denies any shortness of breath, cough, vomiting, hematuria or pyuria. He was having some constipation and took a stool softener/laxative. Since then he has had approximately 3 bowel movements per day for the past 2 days. He denies any melena or hematochezia. He's not had any chest pain or shortness of breath, no lightheadedness, no nausea. He was brought to the emergency room for evaluation where he was noted to be febrile with a temperature 102.5. Urine was indicative of a urinary tract infection. Chest x-ray did not show any signs of pneumonia. Troponin was found to be elevated at 1.2. EKG did not show any new  changes. The patient has been referred for admission.   Hospital Course:  This gentleman was admitted to the step down unit with sepsis due to UTI.  He was started empirically on rocephin.  Urine culture was positive for E coli, blood cultures negative.  Through his hospital course he improved and became afebrile.  Hemodynamically he stabilized. CT of the chest/abd/pelvis was unremarkable for occult infection.  He did have a bump in his cardiac enzymes.  This was felt to be a type 2 MI due to demand ischemia.  Echo did not reveal any wall motion abnormalities, and EF was intact.  He was seen by cardiology who did not recommend any further intervention.  He was also dehydrated and in acute renal failure on admission, and that has since improved with hydration.  He has chronic anemia, no evidence of bleeding, stools heme negative.  Anemia panel indicated iron deficiency anemia, and he is on daily supplementation.  We will increase his iron supplementation to TID. He will need repeat labs in 1 week.  He was seen by Physical therapy and recommended SNF placement.  He will be discharged there today.   Procedures:  none  Consultations:  Cardiology consultation, Dr. Dietrich Pates  Discharge Exam: Filed Vitals:   08/08/12 0654 08/08/12 0850 08/08/12 0934 08/08/12 1425  BP: 145/64 150/75 182/67 168/64  Pulse: 72 72 78 79  Temp: 98.1 F (36.7 C)  98.5 F (36.9 C) 97.8 F (36.6 C)  TempSrc: Oral  Oral Oral  Resp: 16 16 16 16   Height:  Weight:      SpO2: 90%  93% 96%    General: NAD Cardiovascular: s1, s2, rrr Respiratory: cta b  Discharge Instructions  Discharge Orders    Future Orders Please Complete By Expires   Diet - low sodium heart healthy      Diet Carb Modified      Increase activity slowly          Medication List     As of 08/08/2012  2:26 PM    STOP taking these medications         lisinopril 5 MG tablet   Commonly known as: PRINIVIL,ZESTRIL      TAKE these  medications         allopurinol 100 MG tablet   Commonly known as: ZYLOPRIM   Take 100 mg by mouth daily.      amLODipine 5 MG tablet   Commonly known as: NORVASC   Take 2 tablets (10 mg total) by mouth daily.      amoxicillin-clavulanate 875-125 MG per tablet   Commonly known as: AUGMENTIN   Take 1 tablet by mouth 2 (two) times daily. Until 9/17      aspirin EC 81 MG tablet   Take 81 mg by mouth daily.      CENTRUM SILVER ADULT 50+ Tabs   Take 1 tablet by mouth daily.      cloNIDine-chlorthalidone 0.1-15 MG per tablet   Commonly known as: CLORPRES   Take 1 tablet by mouth daily.      EYE DROPS OP   Apply 1 drop to eye daily.      hydrALAZINE 25 MG tablet   Commonly known as: APRESOLINE   Take 1 tablet (25 mg total) by mouth 3 (three) times daily.      insulin glargine 100 UNIT/ML injection   Commonly known as: LANTUS   Inject 20 Units into the skin every morning.      iron polysaccharides 150 MG capsule   Commonly known as: NIFEREX   Take 1 capsule (150 mg total) by mouth 3 (three) times daily.      metoprolol tartrate 25 MG tablet   Commonly known as: LOPRESSOR   Take 2 tablets (50 mg total) by mouth 2 (two) times daily.          The results of significant diagnostics from this hospitalization (including imaging, microbiology, ancillary and laboratory) are listed below for reference.    Significant Diagnostic Studies: Ct Abdomen Pelvis Wo Contrast  08/06/2012  *RADIOLOGY REPORT*  Clinical Data:  Fever, generalized weakness, weight loss, chest pain, dyspnea on exertion, not this is, abdominal pain and hematuria.  History of prostate carcinoma.  CT CHEST, ABDOMEN AND PELVIS WITHOUT CONTRAST  Technique:  Multidetector CT imaging of the chest, abdomen and pelvis was performed following the standard protocol with oral contrast but without IV contrast.  Comparison:  01/04/2009  CT CHEST  Findings:  The heart is normal in size.  There are dense coronary artery  calcifications and calcifications along the thoracic aorta and at the origins of the branch vessels.  There are sub centimeter mediastinal lymph nodes.  No mediastinal masses or pathologically enlarged lymph nodes.  The great vessels are normal in caliber.  No hilar masses or adenopathy.  Small right pleural effusion.  There is mild dependent subsegmental atelectasis and thickening of the inferior oblique fissures.  There is some mild right lower lobe atelectasis adjacent to the oblique fissure inferiorly.  The lungs are otherwise  clear.  There are degenerative changes of the thoracic spine.  No osteoblastic or osteolytic lesions.  IMPRESSION: No acute findings.  Dense coronary artery calcifications.  Small right pleural effusion.  Mild lung base subsegmental atelectasis. No evidence of malignancy and no infiltrate or edema.  CT ABDOMEN AND PELVIS  Findings:  Liver shows small low density lesions, the largest in the posterior segment of the right lobe measuring 14.5 mm.  These are consistent with cysts.  A small calcification lies at the dome of the liver consistent with healed granuloma.  Liver is otherwise unremarkable.  Normal spleen.  Normal gallbladder.  A stone projects in the vicinity of the confluence of the cystic duct or common duct. This may be vascular, but is nonspecific.  It is not felt to be a duct stone since it was present previously.  The pancreas is unremarkable.  No discrete adrenal masses.  The kidneys show no masses or stones or hydronephrosis.  There is mild bilateral renal cortical thinning and is presumed to be chronic, although is increased from the prior exam.  The ureters are normal course and caliber.  The bladder is unremarkable.  There are numerous vascular clips in the pelvis consistent with a previous radical prostatectomy.  A fat containing left inguinal hernia is incidentally noted.  There are no pelvic masses.  No pathologically enlarged lymph nodes in the no abnormal fluid  collections.  Air-fluid levels are noted throughout the colon as well as in the small bowel.  The stomach is moderately distended.  There is no bowel wall thickening or adjacent inflammatory changes.  A normal appendix is not visualized but there is no evidence of appendicitis.  The mesentery is unremarkable.  There is dense calcification along the abdominal aorta and its branch vessels.  No aneurysm.  There are changes from posterior spine surgery in the lower lumbar spine with spinous process resection.  There are changes throughout the visualized spine and involving the SI joints.  There are no osteoblastic or osteolytic lesions.  Findings are similar to the prior exam.  IMPRESSION: Air-fluid levels in the colon small bowel are nonspecific.  This could reflect a gastroenteritis.  No bowel wall thickening or evidence of inflammation in the mesentery.  No other evidence of acute abnormality in the abdomen or pelvis. Chronic findings include liver cysts.  There is perinephric stranding that is presumed to be chronic.  There are chronic changes from a previous radical prostatectomy.  There are degenerative changes and postsurgical changes of the lumbar spine and dense aortoiliac vascular calcifications.  No findings to explain hematuria.   Original Report Authenticated By: Domenic Moras, M.D.    Dg Chest 2 View  08/04/2012  *RADIOLOGY REPORT*  Clinical Data:  Cough, evaluate for infiltrate, CHF, free air  CHEST - 2 VIEW  Comparison: Most recent prior chest x-ray 09/26/2009  Findings: Lungs are negative for pulmonary edema, or focal airspace consolidation.  No pneumothorax, or pleural effusion.  Cardiac and mediastinal contours within normal limits.  There is atherosclerosis of the transverse aorta.  No acute osseous abnormality.  No free air under the diaphragm on the upright view. Contiguous flowing anterior osteophytes along the thoracic spine consistent with dystrophic idiopathic skeletal hyperostosis (DISH)   IMPRESSION:  No acute cardiopulmonary disease  Aortic atherosclerosis   Original Report Authenticated By: Vilma Prader    Ct Chest Wo Contrast  08/07/2012  *RADIOLOGY REPORT*  Clinical Data:  Fever, generalized weakness, weight loss, chest pain, dyspnea on exertion,  not this is, abdominal pain and hematuria.  History of prostate carcinoma.  CT CHEST, ABDOMEN AND PELVIS WITHOUT CONTRAST  Technique:  Multidetector CT imaging of the chest, abdomen and pelvis was performed following the standard protocol with oral contrast but without IV contrast.  Comparison:  01/04/2009  CT CHEST  Findings:  The heart is normal in size.  There are dense coronary artery calcifications and calcifications along the thoracic aorta and at the origins of the branch vessels.  There are sub centimeter mediastinal lymph nodes.  No mediastinal masses or pathologically enlarged lymph nodes.  The great vessels are normal in caliber.  No hilar masses or adenopathy.  Small right pleural effusion.  There is mild dependent subsegmental atelectasis and thickening of the inferior oblique fissures.  There is some mild right lower lobe atelectasis adjacent to the oblique fissure inferiorly.  The lungs are otherwise clear.  There are degenerative changes of the thoracic spine.  No osteoblastic or osteolytic lesions.  IMPRESSION: No acute findings.  Dense coronary artery calcifications.  Small right pleural effusion.  Mild lung base subsegmental atelectasis. No evidence of malignancy and no infiltrate or edema.  CT ABDOMEN AND PELVIS  Findings:  Liver shows small low density lesions, the largest in the posterior segment of the right lobe measuring 14.5 mm.  These are consistent with cysts.  A small calcification lies at the dome of the liver consistent with healed granuloma.  Liver is otherwise unremarkable.  Normal spleen.  Normal gallbladder.  A stone projects in the vicinity of the confluence of the cystic duct or common duct. This may be vascular, but is  nonspecific.  It is not felt to be a duct stone since it was present previously.  The pancreas is unremarkable.  No discrete adrenal masses.  The kidneys show no masses or stones or hydronephrosis.  There is mild bilateral renal cortical thinning and is presumed to be chronic, although is increased from the prior exam.  The ureters are normal course and caliber.  The bladder is unremarkable.  There are numerous vascular clips in the pelvis consistent with a previous radical prostatectomy.  A fat containing left inguinal hernia is incidentally noted.  There are no pelvic masses.  No pathologically enlarged lymph nodes in the no abnormal fluid collections.  Air-fluid levels are noted throughout the colon as well as in the small bowel.  The stomach is moderately distended.  There is no bowel wall thickening or adjacent inflammatory changes.  A normal appendix is not visualized but there is no evidence of appendicitis.  The mesentery is unremarkable.  There is dense calcification along the abdominal aorta and its branch vessels.  No aneurysm.  There are changes from posterior spine surgery in the lower lumbar spine with spinous process resection.  There are changes throughout the visualized spine and involving the SI joints.  There are no osteoblastic or osteolytic lesions.  Findings are similar to the prior exam.  IMPRESSION: Air-fluid levels in the colon small bowel are nonspecific.  This could reflect a gastroenteritis.  No bowel wall thickening or evidence of inflammation in the mesentery.  No other evidence of acute abnormality in the abdomen or pelvis. Chronic findings include liver cysts.  There is perinephric stranding that is presumed to be chronic.  There are chronic changes from a previous radical prostatectomy.  There are degenerative changes and postsurgical changes of the lumbar spine and dense aortoiliac vascular calcifications.  No findings to explain hematuria.   Original  Report Authenticated By: Domenic Moras, M.D.    Portable Chest 1 View  08/05/2012  *RADIOLOGY REPORT*  Clinical Data: Sepsis, rule out pneumonia  PORTABLE CHEST - 1 VIEW  Comparison: 08/04/2012  Findings: Cardiomediastinal silhouette is stable.  No acute infiltrate or pulmonary edema.  Atherosclerotic calcifications of thoracic aorta.  Mild elevation of the right hemidiaphragm.  Mild basilar atelectasis.  IMPRESSION: No acute infiltrate or pulmonary edema.  Atherosclerotic calcifications of thoracic aorta again noted.   Original Report Authenticated By: Natasha Mead, M.D.     Microbiology: Recent Results (from the past 240 hour(s))  URINE CULTURE     Status: Normal   Collection Time   08/04/12  3:00 PM      Component Value Range Status Comment   Specimen Description URINE, CATHETERIZED   Final    Special Requests NONE   Final    Culture  Setup Time 08/04/2012 23:28   Final    Colony Count >=100,000 COLONIES/ML   Final    Culture ESCHERICHIA COLI   Final    Report Status 08/06/2012 FINAL   Final    Organism ID, Bacteria ESCHERICHIA COLI   Final   CULTURE, BLOOD (ROUTINE X 2)     Status: Normal (Preliminary result)   Collection Time   08/04/12  4:20 PM      Component Value Range Status Comment   Specimen Description BLOOD LEFT ARM   Final    Special Requests     Final    Value: BOTTLES DRAWN AEROBIC AND ANAEROBIC 6CC EACH BOTTLE   Culture NO GROWTH 4 DAYS   Final    Report Status PENDING   Incomplete   CULTURE, BLOOD (ROUTINE X 2)     Status: Normal (Preliminary result)   Collection Time   08/04/12  4:45 PM      Component Value Range Status Comment   Specimen Description BLOOD LEFT HAND   Final    Special Requests BOTTLES DRAWN AEROBIC ONLY 6CC BOTTLE   Final    Culture NO GROWTH 4 DAYS   Final    Report Status PENDING   Incomplete   MRSA PCR SCREENING     Status: Normal   Collection Time   08/04/12  5:20 PM      Component Value Range Status Comment   MRSA by PCR NEGATIVE  NEGATIVE Final      Labs: Basic Metabolic  Panel:  Lab 08/08/12 0515 08/07/12 0427 08/06/12 0134 08/05/12 0545 08/04/12 1345  NA 138 139 134* 134* 129*  K 3.3* 3.5 3.5 3.4* 3.3*  CL 109 108 102 101 96  CO2 21 22 18* 20 24  GLUCOSE 87 125* 154* 121* 204*  BUN 27* 32* 35* 37* 40*  CREATININE 1.37* 1.38* 1.47* 1.84* 2.02*  CALCIUM 8.9 9.1 9.1 8.8 9.4  MG -- -- -- 1.9 --  PHOS -- -- -- -- --   Liver Function Tests:  Lab 08/05/12 0545 08/04/12 1345  AST 29 27  ALT 12 10  ALKPHOS 68 61  BILITOT 0.3 0.3  PROT 6.6 6.6  ALBUMIN 2.8* 2.9*    Lab 08/04/12 1345  LIPASE 36  AMYLASE --   No results found for this basename: AMMONIA:5 in the last 168 hours CBC:  Lab 08/08/12 0515 08/07/12 0427 08/06/12 0135 08/05/12 0545 08/04/12 1345  WBC 4.2 3.7* 6.4 6.8 9.5  NEUTROABS -- -- -- -- 7.7  HGB 7.9* 8.2* 9.6* 9.0* 8.9*  HCT 24.7* 24.9* 29.5* 27.3*  26.3*  MCV 85.2 84.1 85.3 85.3 85.9  PLT 158 167 154 152 174   Cardiac Enzymes:  Lab 08/05/12 0545 08/05/12 0051 08/04/12 1832 08/04/12 1345  CKTOTAL -- -- -- --  CKMB -- -- -- --  CKMBINDEX -- -- -- --  TROPONINI 0.61* 0.94* 0.88* 1.24*   BNP: BNP (last 3 results)  Basename 08/05/12 0545  PROBNP 981.0*   CBG:  Lab 08/08/12 1121 08/08/12 0754 08/07/12 2102 08/07/12 1613 08/07/12 1121  GLUCAP 191* 87 108* 152* 149*    Time coordinating discharge: greater than 30 minutes  Signed:  Analysse Quinonez  [facility] 08/08/2012, 2:26 PM

## 2012-08-09 LAB — CULTURE, BLOOD (ROUTINE X 2)

## 2012-09-06 ENCOUNTER — Encounter (HOSPITAL_COMMUNITY): Payer: Self-pay | Admitting: *Deleted

## 2012-09-06 ENCOUNTER — Observation Stay (HOSPITAL_COMMUNITY)
Admission: EM | Admit: 2012-09-06 | Discharge: 2012-09-07 | Discharge status: Against Medical Advice | Payer: Medicare Other | Attending: Internal Medicine | Admitting: Internal Medicine

## 2012-09-06 DIAGNOSIS — N183 Chronic kidney disease, stage 3 unspecified: Secondary | ICD-10-CM | POA: Insufficient documentation

## 2012-09-06 DIAGNOSIS — E876 Hypokalemia: Secondary | ICD-10-CM | POA: Insufficient documentation

## 2012-09-06 DIAGNOSIS — E611 Iron deficiency: Secondary | ICD-10-CM | POA: Diagnosis present

## 2012-09-06 DIAGNOSIS — K219 Gastro-esophageal reflux disease without esophagitis: Secondary | ICD-10-CM

## 2012-09-06 DIAGNOSIS — I129 Hypertensive chronic kidney disease with stage 1 through stage 4 chronic kidney disease, or unspecified chronic kidney disease: Secondary | ICD-10-CM | POA: Insufficient documentation

## 2012-09-06 DIAGNOSIS — R0602 Shortness of breath: Secondary | ICD-10-CM | POA: Insufficient documentation

## 2012-09-06 DIAGNOSIS — E1165 Type 2 diabetes mellitus with hyperglycemia: Secondary | ICD-10-CM | POA: Diagnosis present

## 2012-09-06 DIAGNOSIS — I10 Essential (primary) hypertension: Secondary | ICD-10-CM | POA: Diagnosis present

## 2012-09-06 DIAGNOSIS — D509 Iron deficiency anemia, unspecified: Principal | ICD-10-CM | POA: Insufficient documentation

## 2012-09-06 DIAGNOSIS — E119 Type 2 diabetes mellitus without complications: Secondary | ICD-10-CM | POA: Insufficient documentation

## 2012-09-06 DIAGNOSIS — C61 Malignant neoplasm of prostate: Secondary | ICD-10-CM | POA: Insufficient documentation

## 2012-09-06 DIAGNOSIS — I350 Nonrheumatic aortic (valve) stenosis: Secondary | ICD-10-CM

## 2012-09-06 DIAGNOSIS — D649 Anemia, unspecified: Secondary | ICD-10-CM

## 2012-09-06 DIAGNOSIS — E1129 Type 2 diabetes mellitus with other diabetic kidney complication: Secondary | ICD-10-CM | POA: Diagnosis present

## 2012-09-06 DIAGNOSIS — I359 Nonrheumatic aortic valve disorder, unspecified: Secondary | ICD-10-CM | POA: Insufficient documentation

## 2012-09-06 HISTORY — DX: Atherosclerotic heart disease of native coronary artery without angina pectoris: I25.10

## 2012-09-06 HISTORY — DX: Gastro-esophageal reflux disease without esophagitis: K21.9

## 2012-09-06 LAB — COMPREHENSIVE METABOLIC PANEL
ALT: 12 U/L (ref 0–53)
Albumin: 3 g/dL — ABNORMAL LOW (ref 3.5–5.2)
Alkaline Phosphatase: 57 U/L (ref 39–117)
Chloride: 102 mEq/L (ref 96–112)
GFR calc Af Amer: 47 mL/min — ABNORMAL LOW (ref >90)
Glucose, Bld: 146 mg/dL — ABNORMAL HIGH (ref 70–99)
Potassium: 3.1 mEq/L — ABNORMAL LOW (ref 3.5–5.1)
Sodium: 138 mEq/L (ref 135–145)
Total Bilirubin: 0.2 mg/dL — ABNORMAL LOW (ref 0.3–1.2)
Total Protein: 6.5 g/dL (ref 6.0–8.3)

## 2012-09-06 LAB — CBC WITH DIFFERENTIAL/PLATELET
Eosinophils Absolute: 0.1 10*3/uL (ref 0.0–0.7)
Hemoglobin: 5.5 g/dL — CL (ref 13.0–17.0)
Lymphs Abs: 1.3 10*3/uL (ref 0.7–4.0)
MCH: 24.2 pg — ABNORMAL LOW (ref 26.0–34.0)
Neutro Abs: 2.2 10*3/uL (ref 1.7–7.7)
Neutrophils Relative %: 55 % (ref 43–77)
Platelets: 186 10*3/uL (ref 150–400)
RBC: 2.27 MIL/uL — ABNORMAL LOW (ref 4.22–5.81)
WBC: 4 10*3/uL (ref 4.0–10.5)

## 2012-09-06 LAB — GLUCOSE, CAPILLARY: Glucose-Capillary: 165 mg/dL — ABNORMAL HIGH (ref 70–99)

## 2012-09-06 MED ORDER — ALLOPURINOL 100 MG PO TABS
100.0000 mg | ORAL_TABLET | Freq: Every day | ORAL | Status: DC
  Filled 2012-09-06: qty 1

## 2012-09-06 MED ORDER — GLUCERNA SHAKE PO LIQD
237.0000 mL | Freq: Three times a day (TID) | ORAL | Status: DC
  Administered 2012-09-06: 237 mL via ORAL

## 2012-09-06 MED ORDER — CHLORTHALIDONE 25 MG PO TABS
12.5000 mg | ORAL_TABLET | Freq: Every day | ORAL | Status: DC
  Filled 2012-09-06: qty 0.5

## 2012-09-06 MED ORDER — CENTRUM SILVER ADULT 50+ PO TABS: 1.0000 | ORAL_TABLET | Freq: Every day | ORAL | Status: DC

## 2012-09-06 MED ORDER — INSULIN ASPART 100 UNIT/ML ~~LOC~~ SOLN: 0.0000 [IU] | Freq: Three times a day (TID) | SUBCUTANEOUS | Status: DC

## 2012-09-06 MED ORDER — CLONIDINE-CHLORTHALIDONE 0.1-15 MG PO TABS: 1.0000 | ORAL_TABLET | Freq: Every day | ORAL | Status: DC

## 2012-09-06 MED ORDER — INSULIN ASPART 100 UNIT/ML ~~LOC~~ SOLN: 0.0000 [IU] | Freq: Every day | SUBCUTANEOUS | Status: DC

## 2012-09-06 MED ORDER — INSULIN ASPART 100 UNIT/ML ~~LOC~~ SOLN: 0.0000 [IU] | SUBCUTANEOUS | Status: DC

## 2012-09-06 MED ORDER — HYDRALAZINE HCL 25 MG PO TABS
25.0000 mg | ORAL_TABLET | Freq: Three times a day (TID) | ORAL | Status: DC
  Administered 2012-09-06: 25 mg via ORAL
  Filled 2012-09-06 (×4): qty 1

## 2012-09-06 MED ORDER — INSULIN GLARGINE 100 UNIT/ML ~~LOC~~ SOLN: 24.0000 [IU] | Freq: Every morning | SUBCUTANEOUS | Status: DC

## 2012-09-06 MED ORDER — CLONIDINE HCL 0.1 MG PO TABS
0.1000 mg | ORAL_TABLET | Freq: Every day | ORAL | Status: DC
  Filled 2012-09-06: qty 1

## 2012-09-06 MED ORDER — METOPROLOL TARTRATE 50 MG PO TABS
50.0000 mg | ORAL_TABLET | Freq: Two times a day (BID) | ORAL | Status: DC
  Administered 2012-09-06: 50 mg via ORAL
  Filled 2012-09-06 (×3): qty 1

## 2012-09-06 MED ORDER — POTASSIUM CHLORIDE CRYS ER 20 MEQ PO TBCR
20.0000 meq | EXTENDED_RELEASE_TABLET | Freq: Two times a day (BID) | ORAL | Status: DC
  Administered 2012-09-06: 20 meq via ORAL
  Filled 2012-09-06 (×3): qty 1

## 2012-09-06 MED ORDER — ASPIRIN EC 81 MG PO TBEC
81.0000 mg | DELAYED_RELEASE_TABLET | Freq: Every day | ORAL | Status: DC
  Filled 2012-09-06: qty 1

## 2012-09-06 MED ORDER — LISINOPRIL 5 MG PO TABS
5.0000 mg | ORAL_TABLET | Freq: Every day | ORAL | Status: DC
  Filled 2012-09-06: qty 1

## 2012-09-06 MED ORDER — FUROSEMIDE 10 MG/ML IJ SOLN
20.0000 mg | INTRAMUSCULAR | Status: AC | PRN
  Administered 2012-09-06 – 2012-09-07 (×4): 20 mg via INTRAVENOUS
  Filled 2012-09-06 (×2): qty 2

## 2012-09-06 MED ORDER — DIPHENHYDRAMINE HCL 25 MG PO CAPS: 25.0000 mg | ORAL_CAPSULE | Freq: Four times a day (QID) | ORAL | Status: DC | PRN

## 2012-09-06 MED ORDER — ADULT MULTIVITAMIN W/MINERALS CH
1.0000 | ORAL_TABLET | Freq: Every day | ORAL | Status: DC
  Filled 2012-09-06: qty 1

## 2012-09-06 MED ORDER — TETRAHYDROZOLINE HCL 0.05 % OP SOLN
1.0000 [drp] | Freq: Two times a day (BID) | OPHTHALMIC | Status: DC
  Administered 2012-09-06: 1 [drp] via OPHTHALMIC
  Filled 2012-09-06: qty 15

## 2012-09-06 MED ORDER — AMLODIPINE BESYLATE 5 MG PO TABS
5.0000 mg | ORAL_TABLET | Freq: Every day | ORAL | Status: DC
  Filled 2012-09-06: qty 1

## 2012-09-06 MED ORDER — ACETAMINOPHEN 325 MG PO TABS: 325.0000 mg | ORAL_TABLET | Freq: Four times a day (QID) | ORAL | Status: DC | PRN

## 2012-09-06 NOTE — ED Notes (Signed)
The pt had blood drawn 2 days ago and was called today and told to come to the ed.  C/o being very weak

## 2012-09-06 NOTE — ED Provider Notes (Addendum)
History     CSN: [id number]  Arrival date & time 09/06/12  1603   First MD Initiated Contact with Patient 09/06/12 1726      Chief Complaint  Patient presents with  . blood transfusion     (Consider location/radiation/quality/duration/timing/severity/associated sxs/prior treatment) Patient is a 78 y.o. male presenting with weakness. The history is provided by the patient.  Weakness Primary symptoms do not include loss of consciousness, focal weakness, loss of sensation, nausea or vomiting. Primary symptoms comment: generalized weakness The symptoms began more than 1 week ago. The symptoms are worsening. The neurological symptoms are diffuse.  Additional symptoms include weakness. Additional symptoms do not include pain. Associated symptoms comments: Shortness of breath with exertion. Medical issues do not include cerebral vascular accident. Associated medical issues comments: prior hx of transfusions but unknown why looses blood.    Past Medical History  Diagnosis Date  . Diabetes mellitus   . Hypertension   . Arthritis   . Prostate cancer   . Gout     Past Surgical History  Procedure Date  . Prostate surgery   . Umbilical hernia repair     Family History  Problem Relation Age of Onset  . Cancer Mother     Deceased  . Heart failure Sister     Deceased  . Heart failure Brother     Deceased  . Hypertension Mother     Deceased  . Hypertension Father     History  Substance Use Topics  . Smoking status: Former Games developer  . Smokeless tobacco: Not on file  . Alcohol Use: No      Review of Systems  Respiratory: Negative for cough and chest tightness.   Cardiovascular: Negative for chest pain.  Gastrointestinal: Negative for nausea and vomiting.  Neurological: Positive for weakness. Negative for focal weakness and loss of consciousness.  All other systems reviewed and are negative.    Allergies  Review of patient's allergies indicates no known  allergies.  Home Medications   Current Outpatient Rx  Name Route Sig Dispense Refill  . ALLOPURINOL 100 MG PO TABS Oral Take 100 mg by mouth daily.      Marland Kitchen AMLODIPINE BESYLATE 5 MG PO TABS Oral Take 5 mg by mouth daily.    . ASPIRIN EC 81 MG PO TBEC Oral Take 81 mg by mouth daily.      Marland Kitchen CLONIDINE-CHLORTHALIDONE 0.1-15 MG PO TABS Oral Take 1 tablet by mouth daily.      Marland Kitchen HYDRALAZINE HCL 25 MG PO TABS Oral Take 25 mg by mouth 3 (three) times daily.    . INSULIN GLARGINE 100 UNIT/ML [place] SOLN Subcutaneous Inject 24-28 Units into the skin every morning. Inject 23 units if blood sugars are below 150 or inject 28 units if blood sugars are above 150    . LISINOPRIL 5 MG PO TABS Oral Take 5 mg by mouth daily.    Marland Kitchen METOPROLOL TARTRATE 25 MG PO TABS Oral Take 2 tablets (50 mg total) by mouth 2 (two) times daily.    . CENTRUM SILVER ADULT 50+ PO TABS Oral Take 1 tablet by mouth daily.    Marland Kitchen EYE DROPS OP Ophthalmic Apply 1 drop to eye daily.      BP 120/38  Pulse 77  Temp 99 F (37.2 C) (Oral)  Resp 20  SpO2 97%  Physical Exam  Nursing note and vitals reviewed. Constitutional: He is oriented to person, place, and time. He appears well-developed and well-nourished. No distress.  HENT:  Head: Normocephalic and atraumatic.  Right Ear: External ear normal.  Left Ear: External ear normal.  Mouth/Throat: Oropharynx is clear and moist.  Eyes: EOM are normal. Pupils are equal, round, and reactive to light. Right eye exhibits no discharge.       Pale conjunctiva  Neck: Normal range of motion. Neck supple.  Cardiovascular: Normal rate, regular rhythm and intact distal pulses.   Murmur heard.  Crescendo systolic murmur is present with a grade of 3/6  Pulmonary/Chest: Tachypnea noted. No respiratory distress. He has no decreased breath sounds. He has wheezes. He has no rhonchi. He has no rales.  Abdominal: Soft. There is no tenderness.  Musculoskeletal: Normal range of motion. He exhibits no edema and  no tenderness.  Neurological: He is alert and oriented to person, place, and time.  Skin: Skin is warm and dry. No rash noted. There is pallor.  Psychiatric: He has a normal mood and affect.    ED Course  Procedures (including critical care time)  Labs Reviewed  CBC WITH DIFFERENTIAL - Abnormal; Notable for the following:    RBC 2.27 (*)     Hemoglobin 5.5 (*)     HCT 18.7 (*)     MCH 24.2 (*)     MCHC 29.4 (*)     RDW 16.5 (*)     All other components within normal limits  COMPREHENSIVE METABOLIC PANEL - Abnormal; Notable for the following:    Potassium 3.1 (*)     Glucose, Bld 146 (*)     BUN 24 (*)     Creatinine, Ser 1.47 (*)     Albumin 3.0 (*)     Total Bilirubin 0.2 (*)     GFR calc non Af Amer 40 (*)     GFR calc Af Amer 47 (*)     All other components within normal limits  SAMPLE TO BLOOD BANK  PREPARE RBC (CROSSMATCH)  TYPE AND SCREEN  ABO/RH   No results found.   1. Anemia     CRITICAL CARE Performed by: Gwyneth Sprout   Total critical care time: 30  Critical care time was exclusive of separately billable procedures and treating other patients.  Critical care was necessary to treat or prevent imminent or life-threatening deterioration.  Critical care was time spent personally by me on the following activities: development of treatment plan with patient and/or surrogate as well as nursing, discussions with consultants, evaluation of patient's response to treatment, examination of patient, obtaining history from patient or surrogate, ordering and performing treatments and interventions, ordering and review of laboratory studies, ordering and review of radiographic studies, pulse oximetry and re-evaluation of patient's condition.   MDM   Patient with a history of worsening weakness. States that he has a history of having blood transfusions about once a year from an unknown cause. Today patient's only complaint is feeling weak but denies any chest pain.  He does have shortness of breath with exertion. On exam he is asymptomatic but extremely pale. On rectal exam his toes and normal color but he does have hemorrhoids with some bright red blood.  Abdomen is benign. Today hemoglobin is 5.5. Normal creatinine and Hemoccult was positive however with the bright red blood from the hemorrhoids feel that it is not accurate. Blood transfusion started,  and we'll admit patient for further care.        Gwyneth Sprout, MD 09/06/12 1830  Gwyneth Sprout, MD 09/06/12 Paulo Fruit

## 2012-09-06 NOTE — H&P (Signed)
[facility] History and Physical  [name] ZOX:[id number] DOB: 1920-09-29 DOA: 09/06/2012  Referring physician: none PCP: Duane Courier, MD  Specialists: none  Chief Complaint: tired and weak  HPI: [name] is a 78 y.o. male  He lives alone in [place]. He has been feeling weak and tired and tells that has life long anemia. He does not know why. He has chronic DOE, at rest and while sleeping at night too. He has no CP, cough, sputum, n/v/d/ap/hemorrhoids, melena, hematochezia, h/o GIB or hematuria or epistaxis. He feels tired today. He was recently admitted for UTI, sepsis with demand ischemia. Has CKD III but UOP is good. DM is controlled.   Review of Systems: The patient denies anorexia, fever, weight loss,, vision loss, decreased hearing, hoarseness, chest pain, syncope, peripheral edema, balance deficits, hemoptysis, abdominal pain, melena, hematochezia, severe indigestion/heartburn, hematuria, incontinence, genital sores, muscle weakness, suspicious skin lesions, transient blindness, difficulty walking, depression, unusual weight change, abnormal bleeding, enlarged lymph nodes, angioedema, and breast masses.    Past Medical History  Diagnosis Date  . Diabetes mellitus   . Hypertension   . Arthritis   . Prostate cancer   . Gout   . Coronary artery disease   . Shortness of breath   . Chronic kidney disease   . GERD (gastroesophageal reflux disease)    Past Surgical History  Procedure Date  . Prostate surgery   . Umbilical hernia repair   . Carotid endarterectomy    Social History:  reports that he has quit smoking. He does not have any smokeless tobacco history on file. He reports that he does not drink alcohol or use illicit drugs. He lives alone in [place].  No Known Allergies  Family History  Problem Relation Age of Onset  . Cancer Mother     Deceased  . Heart failure Sister     Deceased  . Heart failure Brother     Deceased  . Hypertension  Mother     Deceased  . Hypertension Father     Prior to Admission medications   Medication Sig Start Date End Date Taking? Authorizing Provider  allopurinol (ZYLOPRIM) 100 MG tablet Take 100 mg by mouth daily.     Yes Historical Provider, MD  amLODipine (NORVASC) 5 MG tablet Take 5 mg by mouth daily.   Yes Historical Provider, MD  aspirin EC 81 MG tablet Take 81 mg by mouth daily.     Yes Historical Provider, MD  cloNIDine-chlorthalidone (CLORPRES) 0.1-15 MG per tablet Take 1 tablet by mouth daily.     Yes Historical Provider, MD  hydrALAZINE (APRESOLINE) 25 MG tablet Take 25 mg by mouth 3 (three) times daily.   Yes Historical Provider, MD  insulin glargine (LANTUS) 100 UNIT/ML injection Inject 24-28 Units into the skin every morning. Inject 23 units if blood sugars are below 150 or inject 28 units if blood sugars are above 150 08/08/12 08/08/13 Yes Erick Blinks, MD  lisinopril (PRINIVIL,ZESTRIL) 5 MG tablet Take 5 mg by mouth daily.   Yes Historical Provider, MD  metoprolol tartrate (LOPRESSOR) 25 MG tablet Take 2 tablets (50 mg total) by mouth 2 (two) times daily. 08/08/12 08/08/13 Yes Erick Blinks, MD  Multiple Vitamins-Minerals (CENTRUM SILVER ADULT 50+) TABS Take 1 tablet by mouth daily.   Yes Historical Provider, MD  Tetrahydrozoline HCl (EYE DROPS OP) Apply 1 drop to eye daily.   Yes Historical Provider, MD   Physical Exam: Filed Vitals:   09/06/12 1608 09/06/12 1916 09/06/12 2000  BP: 120/38 129/45   Pulse: 77 72   Temp: 99 F (37.2 C) 98.4 F (36.9 C) 98.6 F (37 C)  TempSrc: Oral Oral Oral  Resp: 20 20 18   SpO2: 97%       General:  A, ox 3, NAD, stable, lying in bed, very calm and fluent  Eyes: PEERLA, EOMI  ENT: OMM, no thrush, poor dentition  Neck: NO LN, JVD  Cardiovascular: S1S2 R, murmur present  Respiratory: CTAB, no w/r/c  Abdomen: NT, ND, BS +  Skin: No rash or bruises  Musculoskeletal: good ROM  Psychiatric: no depression or anxiety  Neurologic:  strength good in all 4 extremities, reflexes intact, sensations intact  Labs on Admission:  Basic Metabolic Panel:  Lab 09/06/12 9604  NA 138  K 3.1*  CL 102  CO2 25  GLUCOSE 146*  BUN 24*  CREATININE 1.47*  CALCIUM 9.8  MG --  PHOS --   Liver Function Tests:  Lab 09/06/12 1636  AST 17  ALT 12  ALKPHOS 57  BILITOT 0.2*  PROT 6.5  ALBUMIN 3.0*   No results found for this basename: LIPASE:5,AMYLASE:5 in the last 168 hours No results found for this basename: AMMONIA:5 in the last 168 hours CBC:  Lab 09/06/12 1636  WBC 4.0  NEUTROABS 2.2  HGB 5.5*  HCT 18.7*  MCV 82.4  PLT 186   Cardiac Enzymes: No results found for this basename: CKTOTAL:5,CKMB:5,CKMBINDEX:5,TROPONINI:5 in the last 168 hours  BNP (last 3 results)  Basename 08/05/12 0545  PROBNP 981.0*   CBG: No results found for this basename: GLUCAP:5 in the last 168 hours  Radiological Exams on Admission: No results found.  EKG: Independently reviewed.   Assessment/Plan Principal Problem:  *Anemia Active Problems:  Hypokalemia  HTN (hypertension)  Aortic stenosis  CKD (chronic kidney disease) stage 3, GFR 30-59 ml/min  GERD (gastroesophageal reflux disease)  Iron deficiency  DM (diabetes mellitus) type II uncontrolled with renal manifestation   Iron defi anemia Will give 4 units of PRBCs with lasix, iron w/u recently has showed deficiency and may benefit from outpatient iron infusion to avoid future episodes.   Hypokalemia will replace  HTN : will monitor and cont tx  AS : will monitor and echo reviewed  CKD : will monitor  GERD : cont treatment  DM : SSI and home meds  Code Status: full Family Communication: none Disposition Plan: home Time spent: 1 hours  Novia Lansberry V. [facility] Pager [phone]  If 7PM-7AM, please contact night-coverage [email or web address] Password [email or web address] 09/06/2012, 8:15 PM

## 2012-09-06 NOTE — ED Notes (Signed)
The doctor him he needed a blood transfusion .  Hx of the same.  Yellowish tint to his skin

## 2012-09-06 NOTE — ED Notes (Signed)
Dr. Plunkett at the bedside.  

## 2012-09-07 DIAGNOSIS — I359 Nonrheumatic aortic valve disorder, unspecified: Secondary | ICD-10-CM

## 2012-09-07 DIAGNOSIS — E1129 Type 2 diabetes mellitus with other diabetic kidney complication: Secondary | ICD-10-CM

## 2012-09-07 DIAGNOSIS — N058 Unspecified nephritic syndrome with other morphologic changes: Secondary | ICD-10-CM

## 2012-09-07 LAB — PREPARE RBC (CROSSMATCH)

## 2012-09-07 NOTE — Progress Notes (Signed)
I was called by Ginger (pt's nurse) that patient is leaving the hospital. I responded to her call and found that patient had left the floor without signing his AMA paper. Pt signed out AMA today before seeing by me. I talked to patient and convinced to sign AMA form. I talked to patient's daughter Stanton Kidney on the phone and pt was escorted to main entrance while waiting for his ride to arrive. He was taken by his relative to home. I talked to Patient's PCP Dr. Mirna Mires on the phone that patient has refused to have blood draw today so we dont know his post PRBCs HB level. Dr. Loleta Chance said he will see patient in his office on Monday to repeat the test. I explained to patient that his iron level was low and probably the cause of anemia. He should get IV iron infusion to avoid future hospitalization and worse outcomes. He agreed with that plan and I conveyed the same message to his PCP. He will work on it outpatient.

## 2012-09-07 NOTE — Discharge Summary (Addendum)
Physician Discharge Summary  [name] NWG:[id number] DOB: February 03, 1920 DOA: 09/06/2012  PCP: Evlyn Courier, MD  Admit date: 09/06/2012 Discharge date: 09/07/2012  This discharge summary was completed after patient left AMA (AMA discharge summary).   Recommendations for Outpatient Follow-up:  F/u with Dr. Mirna Mires on Monday to check HB and arrange for iron infusion.   Discharge Diagnoses:  Principal Problem:  *Anemia Active Problems:  Hypokalemia  HTN (hypertension)  Aortic stenosis  CKD (chronic kidney disease) stage 3, GFR 30-59 ml/min  GERD (gastroesophageal reflux disease)  Iron deficiency  DM (diabetes mellitus) type II uncontrolled with renal manifestation   Discharge Condition: stable  Diet recommendation: carb modified  Filed Weights   09/06/12 2106  Weight: 67.3 kg (148 lb 5.9 oz)    History of present illness:  [name] is a 78 y.o. male  He lives alone in [place]. He has been feeling weak and tired and tells that has life long anemia. He does not know why. He has chronic DOE, at rest and while sleeping at night too. He has no CP, cough, sputum, n/v/d/ap/hemorrhoids, melena, hematochezia, h/o GIB or hematuria or epistaxis. He feels tired today. He was recently admitted for UTI, sepsis with demand ischemia. Has CKD III but UOP is good. DM is controlled.    Hospital Course: Pt received 4 units of PRBCs prior to [facility] discharge. He refused to have H/H checked after PRBCs so we dont have his final HB level. His K was replaced also. Home meds for HTN, CKD, GERD and DM were continued.  I was called by Ginger (pt's nurse) that patient is leaving the hospital. I responded to her call and found that patient had left the floor without signing his AMA paper. Pt signed out AMA today before seeing by me. I talked to patient and convinced to sign AMA form. I talked to patient's daughter Stanton Kidney on the phone and pt was escorted to main entrance while waiting for his ride  to arrive. He was taken by his relative to home. I talked to Patient's PCP Dr. Mirna Mires on the phone that patient has refused to have blood draw today so we dont know his post PRBCs HB level. Dr. Loleta Chance said he will see patient in his office on Monday to repeat the test. I explained to patient that his iron level was low and probably the cause of anemia. He should get IV iron infusion to avoid future hospitalization and worse outcomes. He agreed with that plan and I conveyed the same message to his PCP. He will work on it outpatient.   Procedures: none Consultations:  none  Discharge Exam: Filed Vitals:   09/07/12 0424 09/07/12 0454 09/07/12 0554 09/07/12 0643  BP: 161/61 137/88 155/62 150/68  Pulse: 72 70 78 76  Temp: 98.7 F (37.1 C) 98.4 F (36.9 C) 98.5 F (36.9 C) 98.9 F (37.2 C)  TempSrc: Oral Oral Oral Oral  Resp: 18 18 20 18   Height:      Weight:      SpO2:       Pt left AMA prior to visit today.  Discharge Instructions     Medication List     As of 09/07/2012  5:50 PM    ASK your doctor about these medications         allopurinol 100 MG tablet   Commonly known as: ZYLOPRIM   Take 100 mg by mouth daily.      amLODipine 5 MG tablet  Commonly known as: NORVASC   Take 5 mg by mouth daily.      aspirin EC 81 MG tablet   Take 81 mg by mouth daily.      CENTRUM SILVER ADULT 50+ Tabs   Take 1 tablet by mouth daily.      cloNIDine-chlorthalidone 0.1-15 MG per tablet   Commonly known as: CLORPRES   Take 1 tablet by mouth daily.      EYE DROPS OP   Apply 1 drop to eye daily.      hydrALAZINE 25 MG tablet   Commonly known as: APRESOLINE   Take 25 mg by mouth 3 (three) times daily.      insulin glargine 100 UNIT/ML injection   Commonly known as: LANTUS   Inject 24-28 Units into the skin every morning. Inject 23 units if blood sugars are below 150 or inject 28 units if blood sugars are above 150      lisinopril 5 MG tablet   Commonly known as:  PRINIVIL,ZESTRIL   Take 5 mg by mouth daily.      metoprolol tartrate 25 MG tablet   Commonly known as: LOPRESSOR   Take 2 tablets (50 mg total) by mouth 2 (two) times daily.          The results of significant diagnostics from this hospitalization (including imaging, microbiology, ancillary and laboratory) are listed below for reference.    Significant Diagnostic Studies: No results found.  Microbiology: No results found for this or any previous visit (from the past 240 hour(s)).   Labs: Basic Metabolic Panel:  Lab 09/06/12 4098  NA 138  K 3.1*  CL 102  CO2 25  GLUCOSE 146*  BUN 24*  CREATININE 1.47*  CALCIUM 9.8  MG --  PHOS --   Liver Function Tests:  Lab 09/06/12 1636  AST 17  ALT 12  ALKPHOS 57  BILITOT 0.2*  PROT 6.5  ALBUMIN 3.0*   No results found for this basename: LIPASE:5,AMYLASE:5 in the last 168 hours No results found for this basename: AMMONIA:5 in the last 168 hours CBC:  Lab 09/06/12 1636  WBC 4.0  NEUTROABS 2.2  HGB 5.5*  HCT 18.7*  MCV 82.4  PLT 186   Cardiac Enzymes: No results found for this basename: CKTOTAL:5,CKMB:5,CKMBINDEX:5,TROPONINI:5 in the last 168 hours BNP: BNP (last 3 results)  Basename 08/05/12 0545  PROBNP 981.0*   CBG:  Lab 09/07/12 0803 09/06/12 2250  GLUCAP 96 165*    Time coordinating discharge: 2 hours while waiting patient to be picked up by his relative as well as completing the discharge part of planning.   Signed:  Donalynn Furlong.  [facility] 09/07/2012, 5:50 PM

## 2012-09-07 NOTE — Progress Notes (Signed)
10/12/163 1325 Late entry for 1130 Patient had become agitated and wanted to leave.  Had refused blood draws this am. When entering to give 10 am meds he was dressed and wanting to leave AMA. Refused initially to sign the AMA form. Tried to talk him into staying with no success. Offered to call someone to pick him up family or taxi, he refused.   Dr. Allena Katz aware and came to front entrance to talk to patient and a friend of the family who I notified to pick him up. Dr. Allena Katz called the patient's daughter as well to make her aware. Skin intact. NT Eliberto Ivory remained with patient until his ride picked him up.

## 2012-09-07 NOTE — Progress Notes (Signed)
PT Cancellation Note  Patient Details Name: [name] MRN: [id number] DOB: 05-04-1920   Cancelled Treatment:     Eval canceled due to patient leaving AMA.  On the D/C out, pt appears steady and safe with no overt balance issues.  09/07/2012  [place] Bing, PT [phone] (pager)    Bette Brienza, Eliseo Gum 09/07/2012, 11:52 AM

## 2012-09-08 LAB — TYPE AND SCREEN
ABO/RH(D): O POS
Antibody Screen: NEGATIVE
Unit division: 0
Unit division: 0

## 2012-09-16 ENCOUNTER — Other Ambulatory Visit (HOSPITAL_COMMUNITY): Payer: Self-pay | Admitting: Family Medicine

## 2012-09-16 DIAGNOSIS — R0989 Other specified symptoms and signs involving the circulatory and respiratory systems: Secondary | ICD-10-CM

## 2012-09-18 ENCOUNTER — Ambulatory Visit (HOSPITAL_COMMUNITY): Payer: Medicare Other | Attending: Family Medicine

## 2013-04-27 DIAGNOSIS — I498 Other specified cardiac arrhythmias: Secondary | ICD-10-CM

## 2013-08-06 ENCOUNTER — Other Ambulatory Visit: Payer: Self-pay | Admitting: Cardiovascular Disease

## 2013-08-06 ENCOUNTER — Inpatient Hospital Stay (HOSPITAL_COMMUNITY)
Admission: EM | Admit: 2013-08-06 | Discharge: 2013-08-11 | DRG: 280 | Disposition: A | Discharge status: Home / Self Care | Payer: Medicare Other | Attending: Internal Medicine | Admitting: Internal Medicine

## 2013-08-06 ENCOUNTER — Other Ambulatory Visit: Payer: Self-pay

## 2013-08-06 ENCOUNTER — Emergency Department (HOSPITAL_COMMUNITY): Payer: Medicare Other

## 2013-08-06 ENCOUNTER — Encounter (HOSPITAL_COMMUNITY): Payer: Self-pay

## 2013-08-06 DIAGNOSIS — I252 Old myocardial infarction: Secondary | ICD-10-CM

## 2013-08-06 DIAGNOSIS — J811 Chronic pulmonary edema: Secondary | ICD-10-CM

## 2013-08-06 DIAGNOSIS — D638 Anemia in other chronic diseases classified elsewhere: Secondary | ICD-10-CM | POA: Diagnosis present

## 2013-08-06 DIAGNOSIS — Z794 Long term (current) use of insulin: Secondary | ICD-10-CM

## 2013-08-06 DIAGNOSIS — E872 Acidosis, unspecified: Secondary | ICD-10-CM | POA: Diagnosis present

## 2013-08-06 DIAGNOSIS — I2510 Atherosclerotic heart disease of native coronary artery without angina pectoris: Secondary | ICD-10-CM | POA: Diagnosis present

## 2013-08-06 DIAGNOSIS — E1129 Type 2 diabetes mellitus with other diabetic kidney complication: Secondary | ICD-10-CM | POA: Diagnosis present

## 2013-08-06 DIAGNOSIS — Z66 Do not resuscitate: Secondary | ICD-10-CM | POA: Diagnosis present

## 2013-08-06 DIAGNOSIS — T502X5A Adverse effect of carbonic-anhydrase inhibitors, benzothiadiazides and other diuretics, initial encounter: Secondary | ICD-10-CM | POA: Diagnosis present

## 2013-08-06 DIAGNOSIS — E1165 Type 2 diabetes mellitus with hyperglycemia: Secondary | ICD-10-CM | POA: Diagnosis present

## 2013-08-06 DIAGNOSIS — Z23 Encounter for immunization: Secondary | ICD-10-CM

## 2013-08-06 DIAGNOSIS — I350 Nonrheumatic aortic (valve) stenosis: Secondary | ICD-10-CM | POA: Diagnosis present

## 2013-08-06 DIAGNOSIS — I359 Nonrheumatic aortic valve disorder, unspecified: Secondary | ICD-10-CM

## 2013-08-06 DIAGNOSIS — Z87891 Personal history of nicotine dependence: Secondary | ICD-10-CM

## 2013-08-06 DIAGNOSIS — Z8546 Personal history of malignant neoplasm of prostate: Secondary | ICD-10-CM

## 2013-08-06 DIAGNOSIS — R7989 Other specified abnormal findings of blood chemistry: Secondary | ICD-10-CM

## 2013-08-06 DIAGNOSIS — M129 Arthropathy, unspecified: Secondary | ICD-10-CM | POA: Diagnosis present

## 2013-08-06 DIAGNOSIS — E119 Type 2 diabetes mellitus without complications: Secondary | ICD-10-CM

## 2013-08-06 DIAGNOSIS — N058 Unspecified nephritic syndrome with other morphologic changes: Secondary | ICD-10-CM | POA: Diagnosis present

## 2013-08-06 DIAGNOSIS — M109 Gout, unspecified: Secondary | ICD-10-CM | POA: Diagnosis present

## 2013-08-06 DIAGNOSIS — N179 Acute kidney failure, unspecified: Secondary | ICD-10-CM | POA: Diagnosis present

## 2013-08-06 DIAGNOSIS — D649 Anemia, unspecified: Secondary | ICD-10-CM | POA: Diagnosis present

## 2013-08-06 DIAGNOSIS — I214 Non-ST elevation (NSTEMI) myocardial infarction: Principal | ICD-10-CM | POA: Diagnosis present

## 2013-08-06 DIAGNOSIS — N390 Urinary tract infection, site not specified: Secondary | ICD-10-CM | POA: Diagnosis present

## 2013-08-06 DIAGNOSIS — D509 Iron deficiency anemia, unspecified: Secondary | ICD-10-CM | POA: Diagnosis present

## 2013-08-06 DIAGNOSIS — I10 Essential (primary) hypertension: Secondary | ICD-10-CM

## 2013-08-06 DIAGNOSIS — I509 Heart failure, unspecified: Secondary | ICD-10-CM | POA: Diagnosis present

## 2013-08-06 DIAGNOSIS — E876 Hypokalemia: Secondary | ICD-10-CM | POA: Diagnosis present

## 2013-08-06 DIAGNOSIS — I219 Acute myocardial infarction, unspecified: Secondary | ICD-10-CM

## 2013-08-06 DIAGNOSIS — N183 Chronic kidney disease, stage 3 unspecified: Secondary | ICD-10-CM | POA: Diagnosis present

## 2013-08-06 DIAGNOSIS — IMO0002 Reserved for concepts with insufficient information to code with codable children: Secondary | ICD-10-CM | POA: Diagnosis present

## 2013-08-06 DIAGNOSIS — K219 Gastro-esophageal reflux disease without esophagitis: Secondary | ICD-10-CM | POA: Diagnosis present

## 2013-08-06 DIAGNOSIS — I129 Hypertensive chronic kidney disease with stage 1 through stage 4 chronic kidney disease, or unspecified chronic kidney disease: Secondary | ICD-10-CM | POA: Diagnosis present

## 2013-08-06 DIAGNOSIS — A080 Rotaviral enteritis: Secondary | ICD-10-CM | POA: Diagnosis present

## 2013-08-06 DIAGNOSIS — J9620 Acute and chronic respiratory failure, unspecified whether with hypoxia or hypercapnia: Secondary | ICD-10-CM | POA: Diagnosis present

## 2013-08-06 DIAGNOSIS — J9600 Acute respiratory failure, unspecified whether with hypoxia or hypercapnia: Secondary | ICD-10-CM | POA: Diagnosis present

## 2013-08-06 DIAGNOSIS — I5031 Acute diastolic (congestive) heart failure: Secondary | ICD-10-CM | POA: Diagnosis present

## 2013-08-06 DIAGNOSIS — I5021 Acute systolic (congestive) heart failure: Secondary | ICD-10-CM

## 2013-08-06 LAB — GI PATHOGEN PANEL BY PCR, STOOL
Cryptosporidium by PCR: NEGATIVE
G lamblia by PCR: NEGATIVE
Norovirus GI/GII: NEGATIVE
Shigella by PCR: NEGATIVE

## 2013-08-06 LAB — URINALYSIS, ROUTINE W REFLEX MICROSCOPIC
Ketones, ur: NEGATIVE mg/dL
Leukocytes, UA: NEGATIVE
Nitrite: NEGATIVE
Specific Gravity, Urine: 1.025 (ref 1.005–1.030)
Urobilinogen, UA: 0.2 mg/dL (ref 0.0–1.0)
pH: 6 (ref 5.0–8.0)

## 2013-08-06 LAB — TROPONIN I
Troponin I: >25 ng/mL (ref <0.30)
Troponin I: >20 ng/mL (ref <0.30)
Troponin I: >20 ng/mL (ref <0.30)

## 2013-08-06 LAB — COMPREHENSIVE METABOLIC PANEL
ALT: 13 U/L (ref 0–53)
Alkaline Phosphatase: 70 U/L (ref 39–117)
BUN: 19 mg/dL (ref 6–23)
CO2: 13 mEq/L — ABNORMAL LOW (ref 19–32)
GFR calc Af Amer: 35 mL/min — ABNORMAL LOW (ref >90)
GFR calc non Af Amer: 30 mL/min — ABNORMAL LOW (ref >90)
Glucose, Bld: 417 mg/dL — ABNORMAL HIGH (ref 70–99)
Potassium: 3.4 mEq/L — ABNORMAL LOW (ref 3.5–5.1)
Total Bilirubin: 0.2 mg/dL — ABNORMAL LOW (ref 0.3–1.2)
Total Protein: 6.5 g/dL (ref 6.0–8.3)

## 2013-08-06 LAB — CBC WITH DIFFERENTIAL/PLATELET
Basophils Relative: 0 % (ref 0–1)
Eosinophils Absolute: 0.2 10*3/uL (ref 0.0–0.7)
Lymphs Abs: 4.5 10*3/uL — ABNORMAL HIGH (ref 0.7–4.0)
MCH: 28 pg (ref 26.0–34.0)
MCHC: 30.5 g/dL (ref 30.0–36.0)
MCV: 91.7 fL (ref 78.0–100.0)
Monocytes Absolute: 0.4 10*3/uL (ref 0.1–1.0)
Neutrophils Relative %: 38 % — ABNORMAL LOW (ref 43–77)
Platelets: 320 10*3/uL (ref 150–400)

## 2013-08-06 LAB — LACTIC ACID, PLASMA: Lactic Acid, Venous: 3 mmol/L — ABNORMAL HIGH (ref 0.5–2.2)

## 2013-08-06 LAB — BLOOD GAS, ARTERIAL
Acid-base deficit: 4.4 mmol/L — ABNORMAL HIGH (ref 0.0–2.0)
TCO2: 18.3 mmol/L (ref 0–100)
pCO2 arterial: 31.1 mmHg — ABNORMAL LOW (ref 35.0–45.0)
pO2, Arterial: 63.6 mmHg — ABNORMAL LOW (ref 80.0–100.0)

## 2013-08-06 LAB — TSH: TSH: 1.035 u[IU]/mL (ref 0.350–4.500)

## 2013-08-06 LAB — URINE MICROSCOPIC-ADD ON

## 2013-08-06 LAB — GLUCOSE, CAPILLARY
Glucose-Capillary: 344 mg/dL — ABNORMAL HIGH (ref 70–99)
Glucose-Capillary: 166 mg/dL — ABNORMAL HIGH (ref 70–99)
Glucose-Capillary: 182 mg/dL — ABNORMAL HIGH (ref 70–99)

## 2013-08-06 MED ORDER — NITROGLYCERIN IN D5W 200-5 MCG/ML-% IV SOLN
INTRAVENOUS | Status: AC
  Filled 2013-08-06: qty 250

## 2013-08-06 MED ORDER — FUROSEMIDE 10 MG/ML IJ SOLN
60.0000 mg | Freq: Two times a day (BID) | INTRAMUSCULAR | Status: DC
  Administered 2013-08-06 – 2013-08-07 (×4): 60 mg via INTRAVENOUS
  Filled 2013-08-06 (×5): qty 6

## 2013-08-06 MED ORDER — AMLODIPINE BESYLATE 5 MG PO TABS
5.0000 mg | ORAL_TABLET | Freq: Every day | ORAL | Status: DC
  Administered 2013-08-06 – 2013-08-11 (×5): 5 mg via ORAL
  Filled 2013-08-06 (×6): qty 1

## 2013-08-06 MED ORDER — INSULIN GLARGINE 100 UNIT/ML ~~LOC~~ SOLN
24.0000 [IU] | Freq: Every morning | SUBCUTANEOUS | Status: DC
  Administered 2013-08-06 – 2013-08-11 (×6): 24 [IU] via SUBCUTANEOUS
  Filled 2013-08-06 (×8): qty 0.24

## 2013-08-06 MED ORDER — ALLOPURINOL 100 MG PO TABS
100.0000 mg | ORAL_TABLET | Freq: Every day | ORAL | Status: DC
  Administered 2013-08-06 – 2013-08-11 (×6): 100 mg via ORAL
  Filled 2013-08-06 (×6): qty 1

## 2013-08-06 MED ORDER — HEPARIN (PORCINE) IN NACL 100-0.45 UNIT/ML-% IJ SOLN
1150.0000 [IU]/h | INTRAMUSCULAR | Status: DC
  Administered 2013-08-06: 850 [IU]/h via INTRAVENOUS
  Administered 2013-08-07 (×2): 1100 [IU]/h via INTRAVENOUS
  Filled 2013-08-06 (×4): qty 250

## 2013-08-06 MED ORDER — ASPIRIN 81 MG PO CHEW
CHEWABLE_TABLET | ORAL | Status: AC
  Filled 2013-08-06: qty 4

## 2013-08-06 MED ORDER — SODIUM CHLORIDE 0.9 % IJ SOLN
3.0000 mL | Freq: Two times a day (BID) | INTRAMUSCULAR | Status: DC
  Administered 2013-08-07 – 2013-08-11 (×8): 3 mL via INTRAVENOUS

## 2013-08-06 MED ORDER — METOPROLOL TARTRATE 50 MG PO TABS
50.0000 mg | ORAL_TABLET | Freq: Two times a day (BID) | ORAL | Status: DC
  Administered 2013-08-06 – 2013-08-11 (×11): 50 mg via ORAL
  Filled 2013-08-06 (×11): qty 1

## 2013-08-06 MED ORDER — ATORVASTATIN CALCIUM 40 MG PO TABS
40.0000 mg | ORAL_TABLET | Freq: Every day | ORAL | Status: DC
  Administered 2013-08-06 – 2013-08-10 (×5): 40 mg via ORAL
  Filled 2013-08-06 (×4): qty 1

## 2013-08-06 MED ORDER — CLOPIDOGREL BISULFATE 75 MG PO TABS
75.0000 mg | ORAL_TABLET | Freq: Every day | ORAL | Status: DC
  Administered 2013-08-07 – 2013-08-11 (×5): 75 mg via ORAL
  Filled 2013-08-06 (×5): qty 1

## 2013-08-06 MED ORDER — HEPARIN SODIUM (PORCINE) 5000 UNIT/ML IJ SOLN: 5000.0000 [IU] | Freq: Three times a day (TID) | INTRAMUSCULAR | Status: DC

## 2013-08-06 MED ORDER — FUROSEMIDE 10 MG/ML IJ SOLN
INTRAMUSCULAR | Status: AC
  Filled 2013-08-06: qty 8

## 2013-08-06 MED ORDER — CEFTRIAXONE SODIUM 1 G IJ SOLR
1.0000 g | INTRAMUSCULAR | Status: DC
  Administered 2013-08-06: 1 g via INTRAVENOUS
  Filled 2013-08-06 (×2): qty 10

## 2013-08-06 MED ORDER — INSULIN ASPART 100 UNIT/ML ~~LOC~~ SOLN
0.0000 [IU] | Freq: Three times a day (TID) | SUBCUTANEOUS | Status: DC
  Administered 2013-08-06 – 2013-08-07 (×4): 3 [IU] via SUBCUTANEOUS
  Administered 2013-08-08: 5 [IU] via SUBCUTANEOUS
  Administered 2013-08-08: 3 [IU] via SUBCUTANEOUS
  Administered 2013-08-08: 2 [IU] via SUBCUTANEOUS
  Administered 2013-08-09 (×2): 3 [IU] via SUBCUTANEOUS
  Administered 2013-08-10: 2 [IU] via SUBCUTANEOUS
  Administered 2013-08-11: 3 [IU] via SUBCUTANEOUS
  Administered 2013-08-11: 2 [IU] via SUBCUTANEOUS

## 2013-08-06 MED ORDER — HYDRALAZINE HCL 25 MG PO TABS
25.0000 mg | ORAL_TABLET | Freq: Three times a day (TID) | ORAL | Status: DC
  Administered 2013-08-06 – 2013-08-11 (×15): 25 mg via ORAL
  Filled 2013-08-06 (×15): qty 1

## 2013-08-06 MED ORDER — FUROSEMIDE 10 MG/ML IJ SOLN
80.0000 mg | Freq: Once | INTRAMUSCULAR | Status: AC
  Administered 2013-08-06: 80 mg via INTRAVENOUS

## 2013-08-06 MED ORDER — ASPIRIN EC 325 MG PO TBEC
325.0000 mg | DELAYED_RELEASE_TABLET | Freq: Every day | ORAL | Status: DC
  Administered 2013-08-07 – 2013-08-11 (×5): 325 mg via ORAL
  Filled 2013-08-06 (×5): qty 1

## 2013-08-06 MED ORDER — HEPARIN (PORCINE) IN NACL 100-0.45 UNIT/ML-% IJ SOLN
INTRAMUSCULAR | Status: AC
  Filled 2013-08-06: qty 250

## 2013-08-06 MED ORDER — SODIUM CHLORIDE 0.9 % IV SOLN: 250.0000 mL | INTRAVENOUS | Status: DC | PRN

## 2013-08-06 MED ORDER — HEPARIN BOLUS VIA INFUSION
3000.0000 [IU] | Freq: Once | INTRAVENOUS | Status: AC
  Administered 2013-08-06: 3000 [IU] via INTRAVENOUS
  Filled 2013-08-06: qty 3000

## 2013-08-06 MED ORDER — HEPARIN BOLUS VIA INFUSION
2000.0000 [IU] | Freq: Once | INTRAVENOUS | Status: AC
  Administered 2013-08-06: 2000 [IU] via INTRAVENOUS
  Filled 2013-08-06: qty 2000

## 2013-08-06 MED ORDER — ONDANSETRON HCL 4 MG/2ML IJ SOLN
4.0000 mg | Freq: Four times a day (QID) | INTRAMUSCULAR | Status: DC | PRN
  Administered 2013-08-07: 4 mg via INTRAVENOUS
  Filled 2013-08-06: qty 2

## 2013-08-06 MED ORDER — CLOPIDOGREL BISULFATE 75 MG PO TABS
300.0000 mg | ORAL_TABLET | Freq: Once | ORAL | Status: AC
  Administered 2013-08-06: 300 mg via ORAL
  Filled 2013-08-06: qty 4

## 2013-08-06 MED ORDER — NITROGLYCERIN IN D5W 200-5 MCG/ML-% IV SOLN
10.0000 ug/min | INTRAVENOUS | Status: DC
  Administered 2013-08-06: 5 ug/min via INTRAVENOUS
  Administered 2013-08-08 (×2): 10 ug/min via INTRAVENOUS
  Filled 2013-08-06: qty 250

## 2013-08-06 MED ORDER — ASPIRIN EC 81 MG PO TBEC
81.0000 mg | DELAYED_RELEASE_TABLET | Freq: Every day | ORAL | Status: DC
  Administered 2013-08-06: 81 mg via ORAL
  Filled 2013-08-06: qty 1

## 2013-08-06 MED ORDER — INSULIN ASPART 100 UNIT/ML ~~LOC~~ SOLN
5.0000 [IU] | Freq: Once | SUBCUTANEOUS | Status: AC
  Administered 2013-08-06: 5 [IU] via INTRAVENOUS
  Filled 2013-08-06: qty 1

## 2013-08-06 MED ORDER — SODIUM CHLORIDE 0.9 % IJ SOLN: 3.0000 mL | INTRAMUSCULAR | Status: DC | PRN

## 2013-08-06 MED ORDER — LIVING BETTER WITH HEART FAILURE BOOK
Freq: Once | Status: AC
  Administered 2013-08-06: 19:00:00
  Filled 2013-08-06: qty 1

## 2013-08-06 MED ORDER — INSULIN ASPART 100 UNIT/ML ~~LOC~~ SOLN
0.0000 [IU] | Freq: Every day | SUBCUTANEOUS | Status: DC
  Administered 2013-08-10: 2 [IU] via SUBCUTANEOUS

## 2013-08-06 MED ORDER — ACETAMINOPHEN 325 MG PO TABS: 650.0000 mg | ORAL_TABLET | ORAL | Status: DC | PRN

## 2013-08-06 NOTE — Progress Notes (Signed)
Lab called a positive Rotavirus a MD notified via text page.

## 2013-08-06 NOTE — Consult Note (Addendum)
The patient was seen and examined, and I agree with the assessment and plan as documented above. Pt with acute systolic heart failure (likely) in the setting of NSTEMI, with most recent troponin called to floor at >25. Will manage medically, by increasing ASA to 325 mg daily, initiate a heparin infusion, and administer a clopidogrel load of 300 mg, followed by 75 mg daily, and start Lipitor 40 mg daily. If he were to undergo cardiac catheterization, given his GFR of 35 mL/min, I am concerned about the development of a contrast-induced nephropathy. Secondly, given his Hgb of 8.4, he may not be able to take dual antiplatelet therapy longterm. For these reasons, I believe medical management is a prudent option. His ECG shows diffuse anterior precordial T wave inversion with ST depression, which is concerning for left main coronary vs proximal LAD disease. Given his anemia with a Hgb level which is reportedly at baseline, will repeat a Hgb at 1800. If any signs of bleeding/dropping Hgb, would d/c heparin and treat with ASA and Plavix alone. He lives alone and has been independent with activities. Await results of echocardiogram.

## 2013-08-06 NOTE — ED Provider Notes (Addendum)
CSN: [id number]     Arrival date & time 08/06/13  0320 History   First MD Initiated Contact with Patient 08/06/13 [phone]     Chief Complaint  Patient presents with  . Shortness of Breath   (Consider location/radiation/quality/duration/timing/severity/associated sxs/prior Treatment) HPI..... level V caveat for urgent need for intervention.   Patient was apparently found at home unresponsive with profound dyspnea and a low oxygen saturation. He was cool and clammy. A breathing treatment was administered. His sats improved.  On arrival to the emergency department, patient was dyspneic and tachypneic. He was unable to speak secondary to his dyspnea.  BiPAP was initiated. Respirations improved. Chest x-ray reveals diffuse pulmonary edema. IV Lasix, IV nitroglycerin started.  Past Medical History  Diagnosis Date  . Diabetes mellitus   . Hypertension   . Arthritis   . Prostate cancer   . Gout   . Coronary artery disease   . Shortness of breath   . GERD (gastroesophageal reflux disease)   . Chronic kidney disease    Past Surgical History  Procedure Laterality Date  . Prostate surgery    . Umbilical hernia repair    . Carotid endarterectomy     Family History  Problem Relation Age of Onset  . Cancer Mother     Deceased  . Heart failure Sister     Deceased  . Heart failure Brother     Deceased  . Hypertension Mother     Deceased  . Hypertension Father    History  Substance Use Topics  . Smoking status: Former Games developer  . Smokeless tobacco: Not on file  . Alcohol Use: No    Review of Systems  Unable to perform ROS: Acuity of condition    Allergies  Review of patient's allergies indicates no known allergies.  Home Medications   Current Outpatient Rx  Name  Route  Sig  Dispense  Refill  . allopurinol (ZYLOPRIM) 100 MG tablet   Oral   Take 100 mg by mouth daily.           Marland Kitchen amLODipine (NORVASC) 5 MG tablet   Oral   Take 5 mg by mouth daily.         Marland Kitchen aspirin EC  81 MG tablet   Oral   Take 81 mg by mouth daily.           . cloNIDine-chlorthalidone (CLORPRES) 0.1-15 MG per tablet   Oral   Take 1 tablet by mouth daily.           . hydrALAZINE (APRESOLINE) 25 MG tablet   Oral   Take 25 mg by mouth 3 (three) times daily.         . insulin glargine (LANTUS) 100 UNIT/ML injection   Subcutaneous   Inject 24-28 Units into the skin every morning. Inject 23 units if blood sugars are below 150 or inject 28 units if blood sugars are above 150         . lisinopril (PRINIVIL,ZESTRIL) 5 MG tablet   Oral   Take 5 mg by mouth daily.         . metoprolol tartrate (LOPRESSOR) 25 MG tablet   Oral   Take 2 tablets (50 mg total) by mouth 2 (two) times daily.         . Multiple Vitamins-Minerals (CENTRUM SILVER ADULT 50+) TABS   Oral   Take 1 tablet by mouth daily.         . Tetrahydrozoline HCl (EYE DROPS  OP)   Ophthalmic   Apply 1 drop to eye daily.          BP 135/58  Pulse 91  Resp 29  SpO2 100% Physical Exam  Nursing note and vitals reviewed. Constitutional:  Tachypnea, dyspnea  HENT:  Head: Normocephalic and atraumatic.  Eyes: Conjunctivae and EOM are normal. Pupils are equal, round, and reactive to light.  Neck: Normal range of motion. Neck supple.  Cardiovascular: Normal rate, regular rhythm and normal heart sounds.   Pulmonary/Chest:  Dyspneic, rales  Abdominal: Soft. Bowel sounds are normal.  Musculoskeletal:  Unable  Neurological:  Unable  Skin: Skin is warm and dry.  No peripheral edema  Psychiatric:  Unable    ED Course  Procedures (including critical care time) Labs Review Labs Reviewed  CBC WITH DIFFERENTIAL - Abnormal; Notable for the following:    RBC 3.00 (*)    Hemoglobin 8.4 (*)    HCT 27.5 (*)    RDW 16.6 (*)    Neutrophils Relative % 38 (*)    Lymphocytes Relative 54 (*)    Lymphs Abs 4.5 (*)    All other components within normal limits  TROPONIN I - Abnormal; Notable for the following:     Troponin I 1.13 (*)    All other components within normal limits  COMPREHENSIVE METABOLIC PANEL - Abnormal; Notable for the following:    Potassium 3.4 (*)    CO2 13 (*)    Glucose, Bld 417 (*)    Creatinine, Ser 1.86 (*)    Albumin 3.0 (*)    Total Bilirubin 0.2 (*)    GFR calc non Af Amer 30 (*)    GFR calc Af Amer 35 (*)    All other components within normal limits  URINALYSIS, ROUTINE W REFLEX MICROSCOPIC - Abnormal; Notable for the following:    Glucose, UA 250 (*)    Hgb urine dipstick LARGE (*)    Protein, ur 100 (*)    All other components within normal limits  URINE MICROSCOPIC-ADD ON - Abnormal; Notable for the following:    Squamous Epithelial / LPF MANY (*)    Bacteria, UA MANY (*)    All other components within normal limits  GLUCOSE, CAPILLARY - Abnormal; Notable for the following:    Glucose-Capillary 344 (*)    All other components within normal limits  URINE CULTURE   Imaging Review Dg Chest Portable 1 View  08/06/2013   *RADIOLOGY REPORT*  Clinical Data: Shortness of breath and respiratory distress.  PORTABLE CHEST - 1 VIEW  Comparison: 04/27/2013  Findings: Heart size is normal.  There is diffuse parenchymal airspace disease, new since previous study.  This could be due to edema, pneumonia, or other infiltrative process.  Suggestion of small left pleural effusion.  No pneumothorax.  Calcification of the aorta.  Pulmonary vascularity is obscured by the parenchymal process.  Degenerative changes in the spine and shoulders.  IMPRESSION: Diffuse airspace infiltration in both lungs.   Original Report Authenticated By: Burman Nieves, M.D.  CRITICAL CARE Performed by: Donnetta Hutching  Date: 08/06/2013  Rate: 109  Rhythm: sinus tachy  QRS Axis: normal  Intervals: normal  ST/T Wave abnormalities: ST depression inferiorly and laterally. ST elevation in aVR and V1  Conduction Disutrbances: none  Narrative Interpretation: unremarkable    ?  Total critical care time:  45  Critical care time was exclusive of separately billable procedures and treating other patients.  Critical care was necessary to treat or prevent imminent  or life-threatening deterioration.  Critical care was time spent personally by me on the following activities: development of treatment plan with patient and/or surrogate as well as nursing, discussions with consultants, evaluation of patient's response to treatment, examination of patient, obtaining history from patient or surrogate, ordering and performing treatments and interventions, ordering and review of laboratory studies, ordering and review of radiographic studies, pulse oximetry and re-evaluation of patient's condition.  MDM   1. Pulmonary edema   2. Elevated troponin   3. Anemia   4. Diabetes mellitus    Patient is very complex. I discussed his situation with his daughter Mrs. Jones in [place] [phone].  She confirmed patient did not desire any heroics. Initial EKG shows diffuse ST segment depression inferiorly and laterally with ST elevation in aVR and V1.   Initial troponin 1.13      Also discussed case with the [facility] cardiologist in [place].   BiPAP was initiated immediately which helped greatly. IV Lasix along with IV nitroglycerin given. Vital signs remained stable. I discussed the clinical situation with the daughter in great detail.    Donnetta Hutching, MD 08/06/13 8413  Donnetta Hutching, MD 08/15/13 2440  Donnetta Hutching, MD 08/15/13 1027  Donnetta Hutching, MD 08/15/13 2536  Donnetta Hutching, MD 08/16/13 0020

## 2013-08-06 NOTE — ED Notes (Signed)
CRITICAL VALUE ALERT  Critical value received:  Troponin 1.13 (preliminary result per lab)  Date of notification:  08/06/2013  Time of notification:  0349  Critical value read back:yes  Nurse who received alert:  Kathlene Cote, RN  MD notified (1st page):  Dr. Adriana Simas  Time of first page:  0350  Responding MD:  Dr. Adriana Simas  Time MD responded:  [phone]

## 2013-08-06 NOTE — Progress Notes (Signed)
ANTICOAGULATION CONSULT NOTE   Pharmacy Consult for Heparin Indication: chest pain/ACS  No Known Allergies  Patient Measurements: Height: 5\' 6"  (167.6 cm) Weight: 152 lb 1.9 oz (69 kg) IBW/kg (Calculated) : 63.8  Vital Signs: Temp: 98 F (36.7 C) (09/10 2000) Temp src: Oral (09/10 1600) BP: 143/55 mmHg (09/10 2000) Pulse Rate: 83 (09/10 2000)  Labs:  Recent Labs  08/06/13 0321 08/06/13 1026 08/06/13 1625 08/06/13 2123  HGB 8.4*  --   --   --   HCT 27.5*  --   --   --   PLT 320  --   --   --   HEPARINUNFRC  --   --   --  0.11*  CREATININE 1.86*  --   --   --   TROPONINI 1.13* >25.00* >20.00*  --     Estimated Creatinine Clearance: 23.3 ml/min (by C-G formula based on Cr of 1.86).   Medical History: Past Medical History  Diagnosis Date  . Diabetes mellitus   . Hypertension   . Arthritis   . Prostate cancer   . Gout   . Coronary artery disease   . Shortness of breath   . GERD (gastroesophageal reflux disease)   . Chronic kidney disease     Medications:  Scheduled:  . allopurinol  100 mg Oral Daily  . amLODipine  5 mg Oral Daily  . [START ON 08/07/2013] aspirin EC  325 mg Oral Daily  . atorvastatin  40 mg Oral q1800  . cefTRIAXone (ROCEPHIN)  IV  1 g Intravenous Q24H  . [START ON 08/07/2013] clopidogrel  75 mg Oral Q breakfast  . furosemide  60 mg Intravenous BID  . heparin  2,000 Units Intravenous Once  . hydrALAZINE  25 mg Oral TID  . insulin aspart  0-15 Units Subcutaneous TID WC  . insulin aspart  0-5 Units Subcutaneous QHS  . insulin glargine  24 Units Subcutaneous q morning - 10a  . metoprolol tartrate  50 mg Oral BID  . sodium chloride  3 mL Intravenous Q12H    Assessment: 79 yo M with +NSTEMI to start heparin x48 hrs as part of medical management regimen.   CBC reviewed.  No bleeding noted.  Heparin level below goal  Goal of Therapy:  Heparin level 0.3-0.7 units/ml Monitor platelets by anticoagulation protocol: Yes   Plan:  Heparin  2000 unit IV and increase Heparin infusion to 1100 units/hour Check heparin level at 5 AM and daily  Check daily heparin level & CBC while on heparin  Raquel James, Ermine Stebbins Bennett 08/06/2013,10:12 PM

## 2013-08-06 NOTE — Progress Notes (Addendum)
ANTICOAGULATION CONSULT NOTE - Initial Consult  Pharmacy Consult for Heparin Indication: chest pain/ACS  No Known Allergies  Patient Measurements: Height: 5\' 6"  (167.6 cm) Weight: 152 lb 1.9 oz (69 kg) IBW/kg (Calculated) : 63.8  Vital Signs: Temp: 97.5 F (36.4 C) (09/10 0800) Temp src: Oral (09/10 0800) BP: 137/57 mmHg (09/10 0800) Pulse Rate: 79 (09/10 0800)  Labs:  Recent Labs  08/06/13 0321 08/06/13 1026  HGB 8.4*  --   HCT 27.5*  --   PLT 320  --   CREATININE 1.86*  --   TROPONINI 1.13* >25.00*    Estimated Creatinine Clearance: 23.3 ml/min (by C-G formula based on Cr of 1.86).   Medical History: Past Medical History  Diagnosis Date  . Diabetes mellitus   . Hypertension   . Arthritis   . Prostate cancer   . Gout   . Coronary artery disease   . Shortness of breath   . GERD (gastroesophageal reflux disease)   . Chronic kidney disease     Medications:  Scheduled:  . allopurinol  100 mg Oral Daily  . amLODipine  5 mg Oral Daily  . [START ON 08/07/2013] aspirin EC  325 mg Oral Daily  . atorvastatin  40 mg Oral q1800  . cefTRIAXone (ROCEPHIN)  IV  1 g Intravenous Q24H  . clopidogrel  300 mg Oral Once  . [START ON 08/07/2013] clopidogrel  75 mg Oral Q breakfast  . furosemide  60 mg Intravenous BID  . heparin  5,000 Units Subcutaneous Q8H  . hydrALAZINE  25 mg Oral TID  . insulin aspart  0-15 Units Subcutaneous TID WC  . insulin aspart  0-5 Units Subcutaneous QHS  . insulin glargine  24 Units Subcutaneous q morning - 10a  . metoprolol tartrate  50 mg Oral BID  . sodium chloride  3 mL Intravenous Q12H    Assessment: 79 yo M with +NSTEMI to start heparin x48 hrs as part of medical management regimen.   CBC reviewed.  No bleeding noted.   Goal of Therapy:  Heparin level 0.3-0.7 units/ml Monitor platelets by anticoagulation protocol: Yes   Plan:  D/C sq heparin Heparin 3000 unit IV bolus followed by infusion at 850 units/hr Check 8hr heparin  level  Check daily heparin level & CBC while on heparin  Elson Clan 08/06/2013,12:05 PM

## 2013-08-06 NOTE — Progress Notes (Signed)
Inpatient Diabetes Program Recommendations  AACE/ADA: New Consensus Statement on Inpatient Glycemic Control (2013)  Target Ranges:  Prepandial:   less than 140 mg/dL      Peak postprandial:   less than 180 mg/dL (1-2 hours)      Critically ill patients:  140 - 180 mg/dL  Results for [name], STRADLING (MRN [id number]) as of 08/06/2013 08:40  Ref. Range 08/06/2013 03:21  Glucose Latest Range: 70-99 mg/dL 409 (H)   Results for [name], POFFENBERGER (MRN [id number]) as of 08/06/2013 08:40  Ref. Range 08/06/2013 04:23 08/06/2013 07:40  Glucose-Capillary Latest Range: 70-99 mg/dL 956 (H) 213 (H)    Inpatient Diabetes Program Recommendations Insulin - Basal: If blood glucose consistently greater than 180 mg/dl with Novolog moderate correction, may want to order basal insulin (takes lantus 24-28 untis QAM at home). Correction (SSI): Please order CBGs ACHS with Novolog moderate correction scale. HgbA1C: Please consider ordering A1C to determine glycemic control over the past 2-3 months.  Note: Patient has a history of diabetes and takes Lantus 24-28 units QAM (based on blood glucose) at home for diabetes management.  Currently, patient is not ordered to receive any medications for inpatient glycemic control.  Initial lab glucose was 417 mg/dl on 0/86 @ 5:78.  Patient received Novolog 5 units @ 4:35 this morning and blood glucose at 7:40 was 166 mg/dl.  Please consider ordering CBGs ACHS with Novolog moderate correction scale and an A1C.  If Novolog correction ordered and blood glucose consistently greater than 180 mg/dl, may want to order basal insulin.  Will continue to follow.  Thanks, Orlando Penner, RN, MSN, CCRN Diabetes Coordinator Inpatient Diabetes Program [phone]

## 2013-08-06 NOTE — H&P (Signed)
[facility] History and Physical  Jaryn Rosko ZOX:[id number] DOB: Aug 08, 1920 DOA: 08/06/2013  Referring physician: Dr. Adriana Simas PCP: Evlyn Courier, MD  Specialists:   Chief Complaint: Shortness of breath  HPI: [name] is a 79 y.o. male history of hypertension, diabetes, chronic kidney disease stage III as well as mild to moderate aortic stenosis. Patient reports being in his usual state of health yesterday when he developed acute onset of nausea and vomiting. He felt that it may be related to something he ate. This was followed by progressive shortness of breath. Patient's shortness of breath progressed rather quickly. He denied any chest. He felt that he was coughing some pink/bloody frothy sputum. No documented history fevers. He did feel diaphoretic with shortness of breath. He did not have any diarrhea while at home but has had 4 episodes since arriving to the hospital. Denies any melena hematochezia. When his breathing did not improve, he called EMS. He is brought to the hospital with severe dyspnea/tachypnea. He was placed on BiPAP. Chest x-ray indicated bilateral infiltrates/edema. He was started on nitroglycerin infusion and was received IV Lasix. He's been referred for admission.  Review of Systems: Pertinent positives as per history of present illness, otherwise negative  Past Medical History  Diagnosis Date  . Diabetes mellitus   . Hypertension   . Arthritis   . Prostate cancer   . Gout   . Coronary artery disease   . Shortness of breath   . GERD (gastroesophageal reflux disease)   . Chronic kidney disease    Past Surgical History  Procedure Laterality Date  . Prostate surgery    . Umbilical hernia repair    . Carotid endarterectomy     Social History:  reports that he has quit smoking. He does not have any smokeless tobacco history on file. He reports that he does not drink alcohol or use illicit drugs. Lives alone  No Known Allergies  Family History   Problem Relation Age of Onset  . Cancer Mother     Deceased  . Heart failure Sister     Deceased  . Heart failure Brother     Deceased  . Hypertension Mother     Deceased  . Hypertension Father     Prior to Admission medications   Medication Sig Start Date End Date Taking? Authorizing Provider  allopurinol (ZYLOPRIM) 100 MG tablet Take 100 mg by mouth daily.      Historical Provider, MD  amLODipine (NORVASC) 5 MG tablet Take 5 mg by mouth daily.    Historical Provider, MD  aspirin EC 81 MG tablet Take 81 mg by mouth daily.      Historical Provider, MD  cloNIDine-chlorthalidone (CLORPRES) 0.1-15 MG per tablet Take 1 tablet by mouth daily.      Historical Provider, MD  hydrALAZINE (APRESOLINE) 25 MG tablet Take 25 mg by mouth 3 (three) times daily.    Historical Provider, MD  insulin glargine (LANTUS) 100 UNIT/ML injection Inject 24-28 Units into the skin every morning. Inject 23 units if blood sugars are below 150 or inject 28 units if blood sugars are above 150 08/08/12 08/08/13  Erick Blinks, MD  lisinopril (PRINIVIL,ZESTRIL) 5 MG tablet Take 5 mg by mouth daily.    Historical Provider, MD  metoprolol tartrate (LOPRESSOR) 25 MG tablet Take 2 tablets (50 mg total) by mouth 2 (two) times daily. 08/08/12 08/08/13  Erick Blinks, MD  Multiple Vitamins-Minerals (CENTRUM SILVER ADULT 50+) TABS Take 1 tablet by mouth daily.  Historical Provider, MD  Tetrahydrozoline HCl (EYE DROPS OP) Apply 1 drop to eye daily.    Historical Provider, MD   Physical Exam: Filed Vitals:   08/06/13 0800  BP: 137/57  Pulse: 79  Temp: 97.5 F (36.4 C)  Resp: 23     General:  NAD  Eyes: Pupils are equal round react to light  ENT: Mucous membranes are moist  Neck: Supple  Cardiovascular: S1, S2, regular rate and rhythm  Respiratory: Crackles bilaterally  Abdomen: Soft, nontender, mild discomfort in the epigastrium, positive bowel sounds  Skin: No rashes  Musculoskeletal: One plus edema  enlarged ovaries bilaterally  Psychiatric: Normal affect, cooperative with exam  Neurologic: Grossly intact, nonfocal  Labs on Admission:  Basic Metabolic Panel:  Recent Labs Lab 08/06/13 0321  NA 137  K 3.4*  CL 99  CO2 13*  GLUCOSE 417*  BUN 19  CREATININE 1.86*  CALCIUM 9.4   Liver Function Tests:  Recent Labs Lab 08/06/13 0321  AST 33  ALT 13  ALKPHOS 70  BILITOT 0.2*  PROT 6.5  ALBUMIN 3.0*   No results found for this basename: LIPASE, AMYLASE,  in the last 168 hours No results found for this basename: AMMONIA,  in the last 168 hours CBC:  Recent Labs Lab 08/06/13 0321  WBC 8.2  NEUTROABS 3.1  HGB 8.4*  HCT 27.5*  MCV 91.7  PLT 320   Cardiac Enzymes:  Recent Labs Lab 08/06/13 0321  TROPONINI 1.13*    BNP (last 3 results)  Recent Labs  08/06/13 0321  PROBNP 222.8   CBG:  Recent Labs Lab 08/06/13 0423 08/06/13 0740  GLUCAP 344* 166*    Radiological Exams on Admission: Dg Chest Portable 1 View  08/06/2013   *RADIOLOGY REPORT*  Clinical Data: Shortness of breath and respiratory distress.  PORTABLE CHEST - 1 VIEW  Comparison: 04/27/2013  Findings: Heart size is normal.  There is diffuse parenchymal airspace disease, new since previous study.  This could be due to edema, pneumonia, or other infiltrative process.  Suggestion of small left pleural effusion.  No pneumothorax.  Calcification of the aorta.  Pulmonary vascularity is obscured by the parenchymal process.  Degenerative changes in the spine and shoulders.  IMPRESSION: Diffuse airspace infiltration in both lungs.   Original Report Authenticated By: Burman Nieves, M.D.    EKG: Independently reviewed. Mild ST depressions in the lateral leads  Assessment/Plan Active Problems:   UTI (urinary tract infection)   Acute renal failure   Anemia   HTN (hypertension)   Aortic stenosis   CKD (chronic kidney disease) stage 3, GFR 30-59 ml/min   DM (diabetes mellitus) type II uncontrolled  with renal manifestation   Acute respiratory failure   Acute diastolic CHF (congestive heart failure)   Metabolic acidosis   Gastroenteritis   1. Acute respiratory failure, likely due to congestive heart failure. Patient has been improving clinically. We'll continue to wean down oxygen as tolerated. Repeat chest x-ray tomorrow. 2. Acute congestive heart failure, present diastolic. Patient's last echocardiogram was approximately one year ago her systolic function was found to be normal. His aortic stenosis may be contributing to his heart failure. We will repeat a echocardiogram. Interestingly, his BNP was found to be normal range, although clinically he has signs of congestive heart failure. We will continue to treat him with IV Lasix. He is already on beta blocker. We'll continue aspirin. Hold off on ACE inhibitor for now due to elevation of creatinine. Requested cardiology assistance. He  still on nitroglycerin infusion which will be titrated off. 3. Non-ST elevation MI. Patient did have some ST depressions laterally. This was likely a demand ischemia. We will continue to cycle troponins. Continue aspirin. 4. Possible urinary tract infection. We'll start the patient empirically on Rocephin and followup urine culture. 5. Gastroenteritis. Check GI pathogen panel. Start the patient on Protonix. 6. Metabolic acidosis. Check lactic acid and ABG. Could be related to GI losses of bicarbonate. If that is the case, he will likely need oral placement. 7. Chronic anemia, presumed iron deficiency. He's not had any signs of recent bleeding. Hemoglobin appears to be near baseline. We'll continue to follow. 8. Acute on chronic renal failure. Creatinine is elevated from baseline. We'll hold ACE inhibitor for now in light of further diuresis. Continue to monitor urine output and renal function. 9. Diabetes. Continue Lantus and start sliding scale insulin  Code Status: DNR Family Communication: discussed with  daughter Claris Gower over the phone Disposition Plan: pending hospital course  Time spent:  [facility] Pager [phone]  If 7PM-7AM, please contact night-coverage [email or web address] Password [email or web address] 08/06/2013, 9:45 AM

## 2013-08-06 NOTE — Progress Notes (Signed)
UR Chart Review Completed  

## 2013-08-06 NOTE — ED Notes (Signed)
Daughter, Mrs. Yetta Barre, can be contacted at [phone].

## 2013-08-06 NOTE — Progress Notes (Signed)
CRITICAL VALUE ALERT  Critical value received:  Troponin >25  Date of notification:  08/06/2013  Time of notification:  1120  Critical value read back:yes  Nurse who received alert:  M. Winona Legato, RN  MD notified (1st page):  Dr Purvis Sheffield  Time of first page:  1120  MD notified (2nd page):  Time of second page:  Responding MD:  Dr Purvis Sheffield  Time MD responded:  Dr Purvis Sheffield

## 2013-08-06 NOTE — Progress Notes (Signed)
*  PRELIMINARY RESULTS* Echocardiogram 2D Echocardiogram has been performed.  Shamya Macfadden 08/06/2013, 5:19 PM

## 2013-08-06 NOTE — Consult Note (Signed)
Reason for Consult:CHF Referring Physician:Memon  Wilson Dusenbery is an 79 y.o. male.  HPI: This is a 79 y.o. Male patient who came in yesterday with nausea, vomiting, acute shortness of breath, CXR showing ? Edema vs infection, Troponin 1.13, BNP 228. He says he was eating a fish dinner and became nauseated followed by shortness of breath that wouldn't ease with sitting up.   He says he gets short of breath with walking very short distance from living room to bedroom and has to sit on the side of his bed to get his breath. This happens daily for the past several years. He denies any chest pain, palpitations, dizziness, or presyncope.   He had an admission 07/2012 with NSTEMI in setting of renal failure, sepsis and dehydration. He was treated medically. 2Decho showed mild-moderate AS, normal LV function EF 55-60%. Stress myoview 2008 no ischemia.  Past Medical History  Diagnosis Date  . Diabetes mellitus   . Hypertension   . Arthritis   . Prostate cancer   . Gout   . Coronary artery disease   . Shortness of breath   . GERD (gastroesophageal reflux disease)   . Chronic kidney disease     Past Surgical History  Procedure Laterality Date  . Prostate surgery    . Umbilical hernia repair    . Carotid endarterectomy      Family History  Problem Relation Age of Onset  . Cancer Mother     Deceased  . Heart failure Sister     Deceased  . Heart failure Brother     Deceased  . Hypertension Mother     Deceased  . Hypertension Father     Social History:  reports that he has quit smoking. He does not have any smokeless tobacco history on file. He reports that he does not drink alcohol or use illicit drugs.  Allergies: No Known Allergies  Medications:  Scheduled Meds: . allopurinol  100 mg Oral Daily  . amLODipine  5 mg Oral Daily  . aspirin EC  81 mg Oral Daily  . cefTRIAXone (ROCEPHIN)  IV  1 g Intravenous Q24H  . furosemide  60 mg Intravenous BID  . heparin  5,000 Units  Subcutaneous Q8H  . hydrALAZINE  25 mg Oral TID  . insulin aspart  0-15 Units Subcutaneous TID WC  . insulin aspart  0-5 Units Subcutaneous QHS  . insulin glargine  24 Units Subcutaneous q morning - 10a  . metoprolol tartrate  50 mg Oral BID  . sodium chloride  3 mL Intravenous Q12H   Continuous Infusions: . nitroGLYCERIN 5 mcg/min (08/06/13 0345)   PRN Meds:.sodium chloride, acetaminophen, ondansetron (ZOFRAN) IV, sodium chloride   Results for orders placed during the hospital encounter of 08/06/13 (from the past 48 hour(s))  CBC WITH DIFFERENTIAL     Status: Abnormal   Collection Time    08/06/13  3:21 AM      Result Value Range   WBC 8.2  4.0 - 10.5 K/uL   RBC 3.00 (*) 4.22 - 5.81 MIL/uL   Hemoglobin 8.4 (*) 13.0 - 17.0 g/dL   HCT 40.1 (*) 02.7 - 25.3 %   MCV 91.7  78.0 - 100.0 fL   MCH 28.0  26.0 - 34.0 pg   MCHC 30.5  30.0 - 36.0 g/dL   RDW 66.4 (*) 40.3 - 47.4 %   Platelets 320  150 - 400 K/uL   Neutrophils Relative % 38 (*) 43 - 77 %  Lymphocytes Relative 54 (*) 12 - 46 %   Monocytes Relative 5  3 - 12 %   Eosinophils Relative 3  0 - 5 %   Basophils Relative 0  0 - 1 %   Neutro Abs 3.1  1.7 - 7.7 K/uL   Lymphs Abs 4.5 (*) 0.7 - 4.0 K/uL   Monocytes Absolute 0.4  0.1 - 1.0 K/uL   Eosinophils Absolute 0.2  0.0 - 0.7 K/uL   Basophils Absolute 0.0  0.0 - 0.1 K/uL   RBC Morphology POLYCHROMASIA PRESENT     Comment: ELLIPTOCYTES   WBC Morphology ATYPICAL LYMPHOCYTES     Comment: TOXIC GRANULATION     VACUOLATED NEUTROPHILS   Smear Review LARGE PLATELETS PRESENT    TROPONIN I     Status: Abnormal   Collection Time    08/06/13  3:21 AM      Result Value Range   Troponin I 1.13 (*) <0.30 ng/mL   Comment:            Due to the release kinetics of cTnI,     a negative result within the first hours     of the onset of symptoms does not rule out     myocardial infarction with certainty.     If myocardial infarction is still suspected,     repeat the test at  appropriate intervals.     RESULT REPEATED AND VERIFIED     CRITICAL RESULT CALLED TO, READ BACK BY AND VERIFIED WITH:     ARMSTRONG J AT 0348 ON [id number] BY FORSYTH K  COMPREHENSIVE METABOLIC PANEL     Status: Abnormal   Collection Time    08/06/13  3:21 AM      Result Value Range   Sodium 137  135 - 145 mEq/L   Potassium 3.4 (*) 3.5 - 5.1 mEq/L   Chloride 99  96 - 112 mEq/L   CO2 13 (*) 19 - 32 mEq/L   Glucose, Bld 417 (*) 70 - 99 mg/dL   BUN 19  6 - 23 mg/dL   Creatinine, Ser 0.45 (*) 0.50 - 1.35 mg/dL   Calcium 9.4  8.4 - 40.9 mg/dL   Total Protein 6.5  6.0 - 8.3 g/dL   Albumin 3.0 (*) 3.5 - 5.2 g/dL   AST 33  0 - 37 U/L   ALT 13  0 - 53 U/L   Alkaline Phosphatase 70  39 - 117 U/L   Total Bilirubin 0.2 (*) 0.3 - 1.2 mg/dL   GFR calc non Af Amer 30 (*) >90 mL/min   GFR calc Af Amer 35 (*) >90 mL/min   Comment: (NOTE)     The eGFR has been calculated using the CKD EPI equation.     This calculation has not been validated in all clinical situations.     eGFR's persistently <90 mL/min signify possible Chronic Kidney     Disease.  PRO B NATRIURETIC PEPTIDE     Status: None   Collection Time    08/06/13  3:21 AM      Result Value Range   Pro B Natriuretic peptide (BNP) 222.8  0 - 450 pg/mL  URINALYSIS, ROUTINE W REFLEX MICROSCOPIC     Status: Abnormal   Collection Time    08/06/13  4:00 AM      Result Value Range   Color, Urine YELLOW  YELLOW   APPearance CLEAR  CLEAR   Specific Gravity, Urine 1.025  1.005 - 1.030  pH 6.0  5.0 - 8.0   Glucose, UA 250 (*) NEGATIVE mg/dL   Hgb urine dipstick LARGE (*) NEGATIVE   Bilirubin Urine NEGATIVE  NEGATIVE   Ketones, ur NEGATIVE  NEGATIVE mg/dL   Protein, ur 161 (*) NEGATIVE mg/dL   Urobilinogen, UA 0.2  0.0 - 1.0 mg/dL   Nitrite NEGATIVE  NEGATIVE   Leukocytes, UA NEGATIVE  NEGATIVE  URINE MICROSCOPIC-ADD ON     Status: Abnormal   Collection Time    08/06/13  4:00 AM      Result Value Range   Squamous Epithelial / LPF MANY (*)  RARE   WBC, UA 11-20  <3 WBC/hpf   RBC / HPF 7-10  <3 RBC/hpf   Bacteria, UA MANY (*) RARE  GLUCOSE, CAPILLARY     Status: Abnormal   Collection Time    08/06/13  4:23 AM      Result Value Range   Glucose-Capillary 344 (*) 70 - 99 mg/dL   Comment 1 Documented in Chart    MRSA PCR SCREENING     Status: None   Collection Time    08/06/13  6:01 AM      Result Value Range   MRSA by PCR NEGATIVE  NEGATIVE   Comment:            The GeneXpert MRSA Assay (FDA     approved for NASAL specimens     only), is one component of a     comprehensive MRSA colonization     surveillance program. It is not     intended to diagnose MRSA     infection nor to guide or     monitor treatment for     MRSA infections.  GLUCOSE, CAPILLARY     Status: Abnormal   Collection Time    08/06/13  7:40 AM      Result Value Range   Glucose-Capillary 166 (*) 70 - 99 mg/dL    Dg Chest Portable 1 View  08/06/2013   *RADIOLOGY REPORT*  Clinical Data: Shortness of breath and respiratory distress.  PORTABLE CHEST - 1 VIEW  Comparison: 04/27/2013  Findings: Heart size is normal.  There is diffuse parenchymal airspace disease, new since previous study.  This could be due to edema, pneumonia, or other infiltrative process.  Suggestion of small left pleural effusion.  No pneumothorax.  Calcification of the aorta.  Pulmonary vascularity is obscured by the parenchymal process.  Degenerative changes in the spine and shoulders.  IMPRESSION: Diffuse airspace infiltration in both lungs.   Original Report Authenticated By: Burman Nieves, M.D.    ROS See HPI Eyes: Negative Ears:Negative for hearing loss, tinnitus Cardiovascular: Negative for chest pain, palpitations,irregular heartbeat, near-syncope, orthopnea, paroxysmal nocturnal dyspnea and syncope,edema, claudication, cyanosis,.  Respiratory:   Negative for cough, hemoptysis, shortness of breath, sleep disturbances due to breathing, sputum production and wheezing.    Endocrine: Negative for cold intolerance and heat intolerance.  Hematologic/Lymphatic: Negative for adenopathy and bleeding problem. Does not bruise/bleed easily.  Musculoskeletal: Negative.   Gastrointestinal: positive for nausea, no vomiting, reflux, abdominal pain, diarrhea, constipation.   Genitourinary: Negative for bladder incontinence, dysuria, flank pain, frequency, hematuria, hesitancy, nocturia and urgency.  Neurological: Negative.  Allergic/Immunologic: Negative for environmental allergies.  Blood pressure 137/57, pulse 79, temperature 97.5 F (36.4 C), temperature source Oral, resp. rate 23, height 5\' 6"  (1.676 m), weight 152 lb 1.9 oz (69 kg), SpO2 98.00%. Physical Exam PHYSICAL EXAM: Well-nournished, in no acute distress. Neck: murmur portrayed  in carotids.No JVD, HJR, Bruit, or thyroid enlargement Lungs: Decreased breath sounds throughout with bibasilar rales. Cardiovascular: RRR, PMI not displaced, heart sounds distant, 2-3 harsh systolic murmur LSB, decreased S2, no gallops, bruit, thrill, or heave. Abdomen: BS normal. Soft without organomegaly, masses, lesions or tenderness. Extremities: without cyanosis, clubbing or edema. Good distal pulses bilateral SKin: Warm, no lesions or rashes  Musculoskeletal: No deformities Neuro: no focal signs  EKG: NSR with nonspecific ST T wave changes inferior, anterolateral, unchanged from tracing 12/2011  Assessment/Plan: NSTEMI in setting of Acute pulmonary edema, significant anemia.  CHF: diuresisng on Lasix 60mg  IV BID-normal LV function 07/2012, await f/u.  Chronic renal insufficiency: creatinine 1.86  Hx NSTEMI 07/2012 in setting of renal failure, dehydration  Mild-Moderate AS on 2Decho 9/13- repeat pending, may have worsened.  UTI  Hypertension: stable  Anemia: HBG 8.4, check stool guaiac and repeat CBC in am.  [name] 08/06/2013, 10:16 AM

## 2013-08-06 NOTE — ED Notes (Signed)
Upon EMS arrival he was initially unresponsive, he can around but his initial O2 sat was 49%? Lungs sounded full, skin was cool and clammy. Gave him a breathing treatment and his sats were coming up after that. [facility] tried c-pap and he would not tolerate it. Patient states he can't breathe on arrival to the ER.

## 2013-08-07 ENCOUNTER — Inpatient Hospital Stay (HOSPITAL_COMMUNITY): Payer: Medicare Other

## 2013-08-07 DIAGNOSIS — A080 Rotaviral enteritis: Secondary | ICD-10-CM | POA: Diagnosis present

## 2013-08-07 DIAGNOSIS — I214 Non-ST elevation (NSTEMI) myocardial infarction: Secondary | ICD-10-CM | POA: Diagnosis present

## 2013-08-07 LAB — GLUCOSE, CAPILLARY
Glucose-Capillary: 107 mg/dL — ABNORMAL HIGH (ref 70–99)
Glucose-Capillary: 159 mg/dL — ABNORMAL HIGH (ref 70–99)

## 2013-08-07 LAB — BASIC METABOLIC PANEL
CO2: 23 mEq/L (ref 19–32)
GFR calc non Af Amer: 33 mL/min — ABNORMAL LOW (ref >90)
Glucose, Bld: 118 mg/dL — ABNORMAL HIGH (ref 70–99)
Potassium: 3 mEq/L — ABNORMAL LOW (ref 3.5–5.1)
Sodium: 138 mEq/L (ref 135–145)

## 2013-08-07 LAB — CBC
Hemoglobin: 8 g/dL — ABNORMAL LOW (ref 13.0–17.0)
MCH: 27.8 pg (ref 26.0–34.0)
RBC: 2.88 MIL/uL — ABNORMAL LOW (ref 4.22–5.81)

## 2013-08-07 LAB — TROPONIN I
Troponin I: >20 ng/mL (ref <0.30)
Troponin I: 16.9 ng/mL (ref <0.30)

## 2013-08-07 LAB — URINE CULTURE: Culture: NO GROWTH

## 2013-08-07 MED ORDER — POTASSIUM CHLORIDE CRYS ER 20 MEQ PO TBCR
40.0000 meq | EXTENDED_RELEASE_TABLET | ORAL | Status: AC
  Administered 2013-08-07 (×3): 40 meq via ORAL
  Filled 2013-08-07 (×3): qty 2

## 2013-08-07 MED ORDER — NITROGLYCERIN 0.4 MG SL SUBL
SUBLINGUAL_TABLET | SUBLINGUAL | Status: AC
  Administered 2013-08-07: 0.4 mg via SUBLINGUAL
  Filled 2013-08-07: qty 25

## 2013-08-07 MED ORDER — NITROGLYCERIN 0.4 MG SL SUBL
0.4000 mg | SUBLINGUAL_TABLET | SUBLINGUAL | Status: DC | PRN
  Administered 2013-08-07 – 2013-08-10 (×3): 0.4 mg via SUBLINGUAL
  Filled 2013-08-07: qty 25

## 2013-08-07 MED ORDER — ALUM & MAG HYDROXIDE-SIMETH 200-200-20 MG/5ML PO SUSP
30.0000 mL | ORAL | Status: DC | PRN
  Administered 2013-08-07 – 2013-08-11 (×2): 30 mL via ORAL
  Filled 2013-08-07 (×2): qty 30

## 2013-08-07 NOTE — Progress Notes (Signed)
Patient complained of nausea and chest pain with burning. MD notified EKG obtained. Order for nitro subq given x2. Chest pain improved but burning in chest radiating towards neck continued. Patient repositioned. MD notified again. Order given for Maalox. Chest burning decreased.

## 2013-08-07 NOTE — Progress Notes (Signed)
[facility] PROGRESS NOTE  Torrie Lafavor ZOX:[id number] DOB: [birth year] DOA: 08/06/2013 PCP: Evlyn Courier, MD  Assessment/Plan: 1. Non-ST elevation MI. The patient's troponin was found to be greater than 25. He was seen by cardiology and started on heparin infusion. He was loaded with Plavix. Continue on aspirin and Plavix. He is on a statin. He is also on a nitroglycerin infusion which will hopefully be weaned off. 2-D echocardiogram has been done with report pending. Recommendations have been for medical management. We'll continue to cycle troponins to document a downward trend 2. Acute CHF, in light of #1, this is likely a systolic congestive heart failure. 2-D echocardiogram is currently pending. He's been on IV Lasix with good urine output. He briefly did require BiPAP on admission but clinically has improved. He is currently on nasal cannula and oxygen will be further weaned down as tolerated. Continue current treatments. 3. Acute respiratory failure due to #2. Improving 4. Rotavirus enteritis. Patient had significant diarrhea on admission. Stools tested positive for rotavirus. Management will be supportive. He reports improvement of his diarrhea. 5. Hypokalemia. Likely due to Lasix. Will replace. 6. Metabolic acidosis, patient did have elevated lactic acid. Acidosis has since improved. 7. CKD stage III. Creatinine appears to be approaching baseline. 8. Chronic anemia, presumed iron deficiency based on review of records. Hemoglobin appears to be stable. We'll continue to follow and transfuse as necessary. 9. Possible urinary tract infection. Patient was empirically started on Rocephin. Urine culture showed no growth and therefore antibiotics will be discontinued. 10. Aortic stenosis, was previously classified as mild to moderate on echo from 2013. We'll reassess on current echo.  Code Status: DO NOT RESUSCITATE Family Communication: Discussed with the patient's daughter over the phone  on 9/10 Disposition Plan: Pending hospital course   Consultants:  Cardiology  Procedures:  None  Antibiotics:  Rocephin 9/10-9/11  HPI/Subjective: Patient denies any chest pain. Reports shortness of breath is improving. He's not had any further diarrhea or vomiting.  Objective: Filed Vitals:   08/07/13 0600  BP: 149/49  Pulse: 66  Temp:   Resp: 21    Intake/Output Summary (Last 24 hours) at 08/07/13 0858 Last data filed at 08/07/13 0600  Gross per 24 hour  Intake 728.11 ml  Output   4100 ml  Net -3371.89 ml   Filed Weights   08/06/13 0629 08/07/13 0500  Weight: 69 kg (152 lb 1.9 oz) 68.4 kg (150 lb 12.7 oz)    Exam:   General:  NAD  Cardiovascular: S1, S2 RRR  Respiratory: CTA B  Abdomen: soft, nt, nd, bs+  Musculoskeletal: no pedal edema.   Data Reviewed: Basic Metabolic Panel:  Recent Labs Lab 08/06/13 0321 08/07/13 0516  NA 137 138  K 3.4* 3.0*  CL 99 104  CO2 13* 23  GLUCOSE 417* 118*  BUN 19 25*  CREATININE 1.86* 1.72*  CALCIUM 9.4 9.2   Liver Function Tests:  Recent Labs Lab 08/06/13 0321  AST 33  ALT 13  ALKPHOS 70  BILITOT 0.2*  PROT 6.5  ALBUMIN 3.0*   No results found for this basename: LIPASE, AMYLASE,  in the last 168 hours No results found for this basename: AMMONIA,  in the last 168 hours CBC:  Recent Labs Lab 08/06/13 0321 08/06/13 2123 08/07/13 0516  WBC 8.2  --  8.0  NEUTROABS 3.1  --   --   HGB 8.4* 8.4* 8.0*  HCT 27.5*  --  25.0*  MCV 91.7  --  86.8  PLT 320  --  224   Cardiac Enzymes:  Recent Labs Lab 08/06/13 0321 08/06/13 1026 08/06/13 1625 08/06/13 2123  TROPONINI 1.13* >25.00* >20.00* >20.00*   BNP (last 3 results)  Recent Labs  08/06/13 0321  PROBNP 222.8   CBG:  Recent Labs Lab 08/06/13 0740 08/06/13 1159 08/06/13 1646 08/06/13 2101 08/07/13 0750  GLUCAP 166* 182* 172* 139* 107*    Recent Results (from the past 240 hour(s))  URINE CULTURE     Status: None    Collection Time    08/06/13  4:00 AM      Result Value Range Status   Specimen Description URINE, CATHETERIZED   Final   Special Requests NONE   Final   Culture  Setup Time     Final   Value: 08/06/2013 10:40     Performed at [facility] Count     Final   Value: NO GROWTH     Performed at [facility]   Culture     Final   Value: NO GROWTH     Performed at [facility]   Report Status 08/07/2013 FINAL   Final  MRSA PCR SCREENING     Status: None   Collection Time    08/06/13  6:01 AM      Result Value Range Status   MRSA by PCR NEGATIVE  NEGATIVE Final   Comment:            The GeneXpert MRSA Assay (FDA     approved for NASAL specimens     only), is one component of a     comprehensive MRSA colonization     surveillance program. It is not     intended to diagnose MRSA     infection nor to guide or     monitor treatment for     MRSA infections.     Studies: Dg Chest Port 1 View  08/07/2013   *RADIOLOGY REPORT*  Clinical Data: Congestive heart failure  PORTABLE CHEST - 1 VIEW  Comparison: August 06, 2013.  Findings: Cardiomediastinal silhouette appears normal. Degenerative changes of lower thoracic spine are noted.  No pleural effusion or pneumothorax is noted.  The diffuse airspace opacity noted on prior exam appears significantly improved with only minimal residual density remaining.  Bony thorax is intact.  IMPRESSION: Bilateral airspace opacities noted on prior exam are significantly improved and nearly resolved.   Original Report Authenticated By: Lupita Raider.,  M.D.   Dg Chest Portable 1 View  08/06/2013   *RADIOLOGY REPORT*  Clinical Data: Shortness of breath and respiratory distress.  PORTABLE CHEST - 1 VIEW  Comparison: 04/27/2013  Findings: Heart size is normal.  There is diffuse parenchymal airspace disease, new since previous study.  This could be due to edema, pneumonia, or other infiltrative process.  Suggestion of small left  pleural effusion.  No pneumothorax.  Calcification of the aorta.  Pulmonary vascularity is obscured by the parenchymal process.  Degenerative changes in the spine and shoulders.  IMPRESSION: Diffuse airspace infiltration in both lungs.   Original Report Authenticated By: Burman Nieves, M.D.    Scheduled Meds: . allopurinol  100 mg Oral Daily  . amLODipine  5 mg Oral Daily  . aspirin EC  325 mg Oral Daily  . atorvastatin  40 mg Oral q1800  . cefTRIAXone (ROCEPHIN)  IV  1 g Intravenous Q24H  . clopidogrel  75 mg Oral Q breakfast  . furosemide  60 mg Intravenous BID  . hydrALAZINE  25 mg Oral TID  . insulin aspart  0-15 Units Subcutaneous TID WC  . insulin aspart  0-5 Units Subcutaneous QHS  . insulin glargine  24 Units Subcutaneous q morning - 10a  . metoprolol tartrate  50 mg Oral BID  . sodium chloride  3 mL Intravenous Q12H   Continuous Infusions: . heparin 1,100 Units/hr (08/07/13 0600)  . nitroGLYCERIN 5 mcg/min (08/07/13 0600)    Active Problems:   UTI (urinary tract infection)   Acute renal failure   Hypokalemia   Anemia   HTN (hypertension)   Aortic stenosis   CKD (chronic kidney disease) stage 3, GFR 30-59 ml/min   DM (diabetes mellitus) type II uncontrolled with renal manifestation   Acute respiratory failure   Acute diastolic CHF (congestive heart failure)   Metabolic acidosis   Rotavirus enteritis   NSTEMI (non-ST elevated myocardial infarction)    Time spent:     [facility]  [facility] Pager [phone]. If 7PM-7AM, please contact night-coverage at [email or web address], password [facility] 08/07/2013, 8:58 AM  LOS: 1 day

## 2013-08-07 NOTE — Progress Notes (Signed)
ANTICOAGULATION CONSULT NOTE   Pharmacy Consult for Heparin Indication: chest pain/ACS  No Known Allergies  Patient Measurements: Height: 5\' 6"  (167.6 cm) Weight: 150 lb 12.7 oz (68.4 kg) IBW/kg (Calculated) : 63.8  Vital Signs: Temp: 98.6 F (37 C) (09/11 0400) Temp src: Oral (09/11 0400) BP: 146/52 mmHg (09/11 1007) Pulse Rate: 72 (09/11 1008)  Labs:  Recent Labs  08/06/13 0321  08/06/13 1625 08/06/13 2123 08/07/13 0516 08/07/13 0934  HGB 8.4*  --   --  8.4* 8.0*  --   HCT 27.5*  --   --   --  25.0*  --   PLT 320  --   --   --  224  --   HEPARINUNFRC  --   --   --  0.11* 0.30  --   CREATININE 1.86*  --   --   --  1.72*  --   TROPONINI 1.13*  < > >20.00* >20.00*  --  >20.00*  < > = values in this interval not displayed.  Estimated Creatinine Clearance: 25.2 ml/min (by C-G formula based on Cr of 1.72).   Medical History: Past Medical History  Diagnosis Date  . Diabetes mellitus   . Hypertension   . Arthritis   . Prostate cancer   . Gout   . Coronary artery disease   . Shortness of breath   . GERD (gastroesophageal reflux disease)   . Chronic kidney disease     Medications:  Scheduled:  . allopurinol  100 mg Oral Daily  . amLODipine  5 mg Oral Daily  . aspirin EC  325 mg Oral Daily  . atorvastatin  40 mg Oral q1800  . clopidogrel  75 mg Oral Q breakfast  . furosemide  60 mg Intravenous BID  . hydrALAZINE  25 mg Oral TID  . insulin aspart  0-15 Units Subcutaneous TID WC  . insulin aspart  0-5 Units Subcutaneous QHS  . insulin glargine  24 Units Subcutaneous q morning - 10a  . metoprolol tartrate  50 mg Oral BID  . potassium chloride  40 mEq Oral Q3H  . sodium chloride  3 mL Intravenous Q12H    Assessment: 79 yo M with +NSTEMI on heparin x48 hrs as part of medical management regimen.   CBC reviewed.  No bleeding noted.  Heparin level at goal.  Goal of Therapy:  Heparin level 0.3-0.7 units/ml Monitor platelets by anticoagulation protocol: Yes   Plan:  Continue Heparin infusion at 1100 units/hour Check daily heparin level & CBC while on heparin  Zareya Tuckett, [facility] 08/07/2013,10:18 AM

## 2013-08-08 LAB — BASIC METABOLIC PANEL
BUN: 30 mg/dL — ABNORMAL HIGH (ref 6–23)
Calcium: 9.7 mg/dL (ref 8.4–10.5)
Chloride: 99 mEq/L (ref 96–112)
Creatinine, Ser: 1.91 mg/dL — ABNORMAL HIGH (ref 0.50–1.35)
GFR calc Af Amer: 34 mL/min — ABNORMAL LOW (ref >90)

## 2013-08-08 LAB — CBC
Hemoglobin: 7.5 g/dL — ABNORMAL LOW (ref 13.0–17.0)
MCH: 27.7 pg (ref 26.0–34.0)
MCV: 87.8 fL (ref 78.0–100.0)
Platelets: 252 10*3/uL (ref 150–400)
RBC: 2.71 MIL/uL — ABNORMAL LOW (ref 4.22–5.81)

## 2013-08-08 LAB — GLUCOSE, CAPILLARY
Glucose-Capillary: 141 mg/dL — ABNORMAL HIGH (ref 70–99)
Glucose-Capillary: 151 mg/dL — ABNORMAL HIGH (ref 70–99)

## 2013-08-08 LAB — PREPARE RBC (CROSSMATCH)

## 2013-08-08 MED ORDER — FUROSEMIDE 40 MG PO TABS
40.0000 mg | ORAL_TABLET | Freq: Every day | ORAL | Status: DC
  Administered 2013-08-08: 40 mg via ORAL
  Filled 2013-08-08: qty 1

## 2013-08-08 MED ORDER — NITROGLYCERIN IN D5W 200-5 MCG/ML-% IV SOLN: 2.0000 ug/min | INTRAVENOUS | Status: DC

## 2013-08-08 MED ORDER — PROMETHAZINE HCL 25 MG/ML IJ SOLN
12.5000 mg | Freq: Once | INTRAMUSCULAR | Status: AC
  Administered 2013-08-08: 12.5 mg via INTRAVENOUS
  Filled 2013-08-08: qty 1

## 2013-08-08 MED ORDER — MORPHINE SULFATE 2 MG/ML IJ SOLN
2.0000 mg | INTRAMUSCULAR | Status: DC | PRN
  Administered 2013-08-08: 2 mg via INTRAVENOUS
  Filled 2013-08-08: qty 1

## 2013-08-08 NOTE — Progress Notes (Signed)
[facility] PROGRESS NOTE  [name] ZOX:[id number] DOB: [birth year] DOA: 08/06/2013 PCP: Evlyn Courier, MD  Assessment/Plan: 1. Non-ST elevation MI. The patient's troponin was found to be greater than 25. He was seen by cardiology and started on heparin infusion. He was loaded with Plavix. Continue on aspirin and Plavix. He is on a statin. He is also on a nitroglycerin infusion. 2-D echocardiogram shows normal EF and no wall motion abnormalities. Recommendations up to this point have been for medical management. troponins are trending down. Await further recommendations from cardiology. 2. Acute CHF, presumed diastolic. He's been on IV Lasix with good urine output. He briefly did require BiPAP on admission but clinically has improved. His oxygen has now been weaned off. With rising creatinine, will change lasix to po.  Would hold off on ACE due to renal dysfunction. 3. Acute respiratory failure due to #2. Improving 4. Rotavirus enteritis. Patient had significant diarrhea on admission. Stools tested positive for rotavirus. Management will be supportive. He reports improvement of his diarrhea. 5. Hypokalemia. Likely due to Lasix. Will replace. 6. Metabolic acidosis, patient did have elevated lactic acid. Acidosis has since improved. 7. CKD stage III. Creatinine now trending up, maybe from lasix.  Will change lasix to po. 8. Chronic anemia, presumed iron deficiency based on review of records. Hemoglobin has slowly trended down.  No obvious signs of bleeding.  Stool occult blood has not been collected yet, but no gross melena or hematochezia.  In light of ACS, will transfuse to keep hemoglobin above 8. 9. Aortic stenosis, was previously classified as mild to moderate on echo from 2013. Current echo shows moderate to severe.  Code Status: DO NOT RESUSCITATE Family Communication: Discussed with the patient's daughter over the phone on 9/10 Disposition Plan: Pending hospital  course   Consultants:  Cardiology  Procedures:  None  Antibiotics:  Rocephin 9/10-9/11  HPI/Subjective: Events from overnight noted.  Patient describes having chest tightness overnight, some shortness of breath and feeling that something was stuck in his chest.  He was started back on nitroglycerin infusion.  He is still on heparin infusion. he also received maalox and morphine. EKG did not show any new changes. He does report improvement of his symptoms.  Objective: Filed Vitals:   08/08/13 0916  BP: 129/48  Pulse:   Temp:   Resp:     Intake/Output Summary (Last 24 hours) at 08/08/13 0929 Last data filed at 08/08/13 0500  Gross per 24 hour  Intake    978 ml  Output   2101 ml  Net  -1123 ml   Filed Weights   08/06/13 0629 08/07/13 0500 08/08/13 0500  Weight: 69 kg (152 lb 1.9 oz) 68.4 kg (150 lb 12.7 oz) 68.8 kg (151 lb 10.8 oz)    Exam:   General:  NAD  Cardiovascular: S1, S2 RRR  Respiratory: CTA B  Abdomen: soft, nt, nd, bs+  Musculoskeletal: no pedal edema.   Data Reviewed: Basic Metabolic Panel:  Recent Labs Lab 08/06/13 0321 08/07/13 0516 08/08/13 0445  NA 137 138 133*  K 3.4* 3.0* 4.1  CL 99 104 99  CO2 13* 23 26  GLUCOSE 417* 118* 187*  BUN 19 25* 30*  CREATININE 1.86* 1.72* 1.91*  CALCIUM 9.4 9.2 9.7   Liver Function Tests:  Recent Labs Lab 08/06/13 0321  AST 33  ALT 13  ALKPHOS 70  BILITOT 0.2*  PROT 6.5  ALBUMIN 3.0*   No results found for this basename: LIPASE, AMYLASE,  in  the last 168 hours No results found for this basename: AMMONIA,  in the last 168 hours CBC:  Recent Labs Lab 08/06/13 0321 08/06/13 2123 08/07/13 0516 08/08/13 0445  WBC 8.2  --  8.0 6.5  NEUTROABS 3.1  --   --   --   HGB 8.4* 8.4* 8.0* 7.5*  HCT 27.5*  --  25.0* 23.8*  MCV 91.7  --  86.8 87.8  PLT 320  --  224 252   Cardiac Enzymes:  Recent Labs Lab 08/06/13 1625 08/06/13 2123 08/07/13 0934 08/07/13 1520 08/07/13 2120  TROPONINI  >20.00* >20.00* >20.00* 16.90* 11.04*   BNP (last 3 results)  Recent Labs  08/06/13 0321  PROBNP 222.8   CBG:  Recent Labs Lab 08/07/13 0750 08/07/13 1150 08/07/13 1633 08/07/13 2112 08/08/13 0744  GLUCAP 107* 159* 177* 191* 160*    Recent Results (from the past 240 hour(s))  URINE CULTURE     Status: None   Collection Time    08/06/13  4:00 AM      Result Value Range Status   Specimen Description URINE, CATHETERIZED   Final   Special Requests NONE   Final   Culture  Setup Time     Final   Value: 08/06/2013 10:40     Performed at [facility] Count     Final   Value: NO GROWTH     Performed at [facility]   Culture     Final   Value: NO GROWTH     Performed at [facility]   Report Status 08/07/2013 FINAL   Final  MRSA PCR SCREENING     Status: None   Collection Time    08/06/13  6:01 AM      Result Value Range Status   MRSA by PCR NEGATIVE  NEGATIVE Final   Comment:            The GeneXpert MRSA Assay (FDA     approved for NASAL specimens     only), is one component of a     comprehensive MRSA colonization     surveillance program. It is not     intended to diagnose MRSA     infection nor to guide or     monitor treatment for     MRSA infections.     Studies: Dg Chest Port 1 View  08/07/2013   *RADIOLOGY REPORT*  Clinical Data: Congestive heart failure  PORTABLE CHEST - 1 VIEW  Comparison: August 06, 2013.  Findings: Cardiomediastinal silhouette appears normal. Degenerative changes of lower thoracic spine are noted.  No pleural effusion or pneumothorax is noted.  The diffuse airspace opacity noted on prior exam appears significantly improved with only minimal residual density remaining.  Bony thorax is intact.  IMPRESSION: Bilateral airspace opacities noted on prior exam are significantly improved and nearly resolved.   Original Report Authenticated By: Lupita Raider.,  M.D.    Scheduled Meds: . allopurinol  100 mg  Oral Daily  . amLODipine  5 mg Oral Daily  . aspirin EC  325 mg Oral Daily  . atorvastatin  40 mg Oral q1800  . clopidogrel  75 mg Oral Q breakfast  . furosemide  60 mg Intravenous BID  . hydrALAZINE  25 mg Oral TID  . insulin aspart  0-15 Units Subcutaneous TID WC  . insulin aspart  0-5 Units Subcutaneous QHS  . insulin glargine  24 Units Subcutaneous q morning - 10a  .  metoprolol tartrate  50 mg Oral BID  . sodium chloride  3 mL Intravenous Q12H   Continuous Infusions: . heparin 1,150 Units/hr (08/08/13 0848)  . nitroGLYCERIN 5 mcg/min (08/08/13 0433)    Active Problems:   UTI (urinary tract infection)   Acute renal failure   Hypokalemia   Anemia   HTN (hypertension)   Aortic stenosis   CKD (chronic kidney disease) stage 3, GFR 30-59 ml/min   DM (diabetes mellitus) type II uncontrolled with renal manifestation   Acute respiratory failure   Acute diastolic CHF (congestive heart failure)   Metabolic acidosis   Rotavirus enteritis   NSTEMI (non-ST elevated myocardial infarction)    Time spent:     [facility]  [facility] Pager [phone]. If 7PM-7AM, please contact night-coverage at [email or web address], password [facility] 08/08/2013, 9:29 AM  LOS: 2 days

## 2013-08-08 NOTE — Progress Notes (Signed)
Code status discussed with pt with pt desire not to be intubated . Pt desires CPR ,emergency drugs & defibrillation.

## 2013-08-08 NOTE — Progress Notes (Signed)
Called by [name]) with patient requesting change in Code Status. Apparently this was brought up by the cardiologist. RN confirmed with patient. He wants everything done except ventilator/intubation. Will change code status.  [name] 8:51 PM

## 2013-08-08 NOTE — Progress Notes (Signed)
[facility] ROUNDING NOTE  Consulting cardiologist:Konaswarin MD  Subjective:   No complaints of chest pain or shortness of breath. Overall weakness.   Objective:   Temp:  [98.2 F (36.8 C)-98.7 F (37.1 C)] 98.4 F (36.9 C) (09/12 0730) Pulse Rate:  [66-95] 66 (09/12 0600) Resp:  [15-27] 15 (09/12 0600) BP: (103-186)/(35-152) 129/48 mmHg (09/12 0916) SpO2:  [92 %-100 %] 100 % (09/12 0600) Weight:  [151 lb 10.8 oz (68.8 kg)] 151 lb 10.8 oz (68.8 kg) (09/12 0500) Last BM Date: 08/06/13  Filed Weights   08/06/13 0629 08/07/13 0500 08/08/13 0500  Weight: 152 lb 1.9 oz (69 kg) 150 lb 12.7 oz (68.4 kg) 151 lb 10.8 oz (68.8 kg)    Intake/Output Summary (Last 24 hours) at 08/08/13 0944 Last data filed at 08/08/13 0500  Gross per 24 hour  Intake    978 ml  Output   2101 ml  Net  -1123 ml    Telemetry: NSR with T-wave depression rate in the 60's.   Exam:  General: No acute distress.  HEENT: Conjunctiva and lids normal, oropharynx clear.  Lungs: Clear to auscultation, nonlabored.  Cardiac: No elevated JVP or bruits. RRR, high pitched systolic murmur with radiation to the carotids bilaterally. Abdomen: Normoactive bowel sounds, nontender, nondistended.  Extremities: No pitting edema, distal pulses full.  Neuropsychiatric: Alert and oriented x3, affect appropriate.   Lab Results:  Basic Metabolic Panel:  Recent Labs Lab 08/06/13 0321 08/07/13 0516 08/08/13 0445  NA 137 138 133*  K 3.4* 3.0* 4.1  CL 99 104 99  CO2 13* 23 26  GLUCOSE 417* 118* 187*  BUN 19 25* 30*  CREATININE 1.86* 1.72* 1.91*  CALCIUM 9.4 9.2 9.7    Liver Function Tests:  Recent Labs Lab 08/06/13 0321  AST 33  ALT 13  ALKPHOS 70  BILITOT 0.2*  PROT 6.5  ALBUMIN 3.0*    CBC:  Recent Labs Lab 08/06/13 0321 08/06/13 2123 08/07/13 0516 08/08/13 0445  WBC 8.2  --  8.0 6.5  HGB 8.4* 8.4* 8.0* 7.5*  HCT 27.5*  --  25.0* 23.8*  MCV 91.7  --  86.8 87.8  PLT 320  --  224  252    Cardiac Enzymes:  Recent Labs Lab 08/07/13 0934 08/07/13 1520 08/07/13 2120  TROPONINI >20.00* 16.90* 11.04*    BNP:  Recent Labs  08/06/13 0321  PROBNP 222.8   Echocardiogram: 08/06/2013 Left ventricle: The cavity size was at the lower limits of normal. Wall thickness was increased increased in a pattern of mild to moderate LVH. Systolic function was normal. The estimated ejection fraction was in the range of 50% to 55%. Wall motion was normal; there were no regional wall motion abnormalities. Diastolic function is indeterminate, there is evidence of elevated left atrial pressure. - Aortic valve: Mildly calcified annulus. Trileaflet; severely thickened leaflets. The left coronary cusps is heavily calcified with severely restricted movement. There was moderate to severe stenosis. Majority of parameters meet criteria for severe stenosis, gradients likely moderate due to Doppler alignment. Morphologically and by continuity equation the stenosis appears severe. Mild regurgitation. Valve area: 0.78cm^2(VTI). Valve area: 0.73cm^2 (Vmax). - Mitral valve: Mildly calcified annulus. Mildly thickened leaflets . Mild regurgitation. - Left atrium: The atrium was moderately dilated. - Pulmonary arteries: Systolic pressure was moderately increased, estimated to be 49mm Hg.    Radiology: Dg Chest Port 1 View  08/07/2013   *RADIOLOGY REPORT*  Clinical Data: Congestive heart failure  PORTABLE CHEST - 1 VIEW  Comparison: August 06, 2013.  Findings: Cardiomediastinal silhouette appears normal. Degenerative changes of lower thoracic spine are noted.  No pleural effusion or pneumothorax is noted.  The diffuse airspace opacity noted on prior exam appears significantly improved with only minimal residual density remaining.  Bony thorax is intact.  IMPRESSION: Bilateral airspace opacities noted on prior exam are significantly improved and nearly resolved.   Original Report  Authenticated By: Lupita Raider.,  M.D.     Medications:   Scheduled Medications: . allopurinol  100 mg Oral Daily  . amLODipine  5 mg Oral Daily  . aspirin EC  325 mg Oral Daily  . atorvastatin  40 mg Oral q1800  . clopidogrel  75 mg Oral Q breakfast  . furosemide  40 mg Oral Daily  . hydrALAZINE  25 mg Oral TID  . insulin aspart  0-15 Units Subcutaneous TID WC  . insulin aspart  0-5 Units Subcutaneous QHS  . insulin glargine  24 Units Subcutaneous q morning - 10a  . metoprolol tartrate  50 mg Oral BID  . sodium chloride  3 mL Intravenous Q12H    Infusions: . heparin 1,150 Units/hr (08/08/13 0848)  . nitroGLYCERIN 5 mcg/min (08/08/13 0433)    PRN Medications: sodium chloride, acetaminophen, alum & mag hydroxide-simeth, morphine injection, nitroGLYCERIN, ondansetron (ZOFRAN) IV, sodium chloride   Assessment and Plan:   1. NSTEMI- Hx of CAD: Evidence of ischemia in the inferior and lateral leads. He was placed on heparin, but with Hgb dropping to  7.5  I will d/c. Recommend restarting O2. Due to co-morbidities, will favor medical management only with metoprolol, Plavix and ASA. Stool for occult blood is pending.  2. Severe AoV stenosis: Per echo on 08/06/2013 with worsened since echo in 2013. Valve area: 0.78cm^2(VTI). Valve area: 0.73cm^2 (Vmax). Do not think he is a candidate for TAVI or repair at this point with anemia and renal insufficiency. Afterload reduction will be necessary. He is on Norvasc 5 mg daily. ACE will not be considered with renal insufficiency. Normal LV fx. (2013 Valve area: 1.21cm^2(VTI). Valve area: 1.18cm^2 (Vmax).)  3. Hypertension: Blood pressure well controlled currently on Norvasc 5 mg daily.   4. Acute Diastolic CHF: He continued diureses. Would not decrease pre-load any further. Now on PO lasix 40 mg daily. May need to decrease dose even further. Need to get Hgb normalized as this will lead to continued CHF, and possibly atrial arrhythmia.  5.  Anemia: Per notes, etiology is iron deficiency. Discontinue heparin infusion. Agree with blood transfusion today. Has history of cecal AV malformation, with repair in 2010. Await hemoccult.  6. History of Carotid Artery Disease; S/P right carotid endarterectomy 06/2002     Bettey Mare. Lyman Bishop NP [facility] 08/08/2013, 9:44 AM   Attending addendum Patient seen with NP Lyman Bishop, agree with note above. Patient is 79 yo male with multiple medical comorbdities admitted w/ NSTEMI, peak trop >20 w/ ST depression in multiple territories. Initially managed under conservative medical strategy due to significant comorbidities and age w/ increased risk of invasive procedures. Patient denies any chest pain or SOB currently. Echo shows fairly preserved function despite his infarct, but does show evidence of significant aortic valvular disease. Risks of revascularization and valve intervention on this patient are very high, agree w/ continued conservative medical management. Agree w/ stopping heparin due to low Hgb, and transfusing. I discussed this strategy with the patient and he understands the risks/benefits and is in agreement, he wants to minimize risk.  I would not overly diurese him, in the setting of significant aortic stenosis this can drop his CO, goal to maintain euvolemia.

## 2013-08-08 NOTE — Progress Notes (Signed)
ANTICOAGULATION CONSULT NOTE   Pharmacy Consult for Heparin Indication: chest pain/ACS  No Known Allergies  Patient Measurements: Height: 5\' 6"  (167.6 cm) Weight: 151 lb 10.8 oz (68.8 kg) IBW/kg (Calculated) : 63.8  Vital Signs: Temp: 98.2 F (36.8 C) (09/12 0400) Temp src: Oral (09/12 0400) BP: 123/46 mmHg (09/12 0600) Pulse Rate: 66 (09/12 0600)  Labs:  Recent Labs  08/06/13 0321  08/06/13 2123 08/07/13 0516 08/07/13 0934 08/07/13 1520 08/07/13 2120 08/08/13 0445  HGB 8.4*  --  8.4* 8.0*  --   --   --  7.5*  HCT 27.5*  --   --  25.0*  --   --   --  23.8*  PLT 320  --   --  224  --   --   --  252  HEPARINUNFRC  --   --  0.11* 0.30  --   --   --  0.21*  CREATININE 1.86*  --   --  1.72*  --   --   --  1.91*  TROPONINI 1.13*  < > >20.00*  --  >20.00* 16.90* 11.04*  --   < > = values in this interval not displayed.  Estimated Creatinine Clearance: 22.7 ml/min (by C-G formula based on Cr of 1.91).  Medical History: Past Medical History  Diagnosis Date  . Diabetes mellitus   . Hypertension   . Arthritis   . Prostate cancer   . Gout   . Coronary artery disease   . Shortness of breath   . GERD (gastroesophageal reflux disease)   . Chronic kidney disease    Medications:  Scheduled:  . allopurinol  100 mg Oral Daily  . amLODipine  5 mg Oral Daily  . aspirin EC  325 mg Oral Daily  . atorvastatin  40 mg Oral q1800  . clopidogrel  75 mg Oral Q breakfast  . furosemide  60 mg Intravenous BID  . hydrALAZINE  25 mg Oral TID  . insulin aspart  0-15 Units Subcutaneous TID WC  . insulin aspart  0-5 Units Subcutaneous QHS  . insulin glargine  24 Units Subcutaneous q morning - 10a  . metoprolol tartrate  50 mg Oral BID  . sodium chloride  3 mL Intravenous Q12H   Assessment: 79 yo M with +NSTEMI on heparin x48 hrs as part of medical management regimen.   CBC reviewed.  No bleeding noted.  Heparin level below goal this morning.  H/H dropping.  Goal of Therapy:   Heparin level 0.3-0.7 units/ml Monitor platelets by anticoagulation protocol: Yes   Plan:  Increase Heparin infusion to 1150 units/hour Check daily heparin level & CBC while on heparin  Margo Aye, Letonia Stead A 08/08/2013,7:40 AM

## 2013-08-08 NOTE — Progress Notes (Signed)
Per discussion with Dr. Veneda Melter do not give lasix, norvasc and hydralazine due to BP on low side.

## 2013-08-08 NOTE — Progress Notes (Signed)
Patient's chest pain increased. He stated that it felt like something was stuck in his chest and asked " do you have anything you can put down my throat to make me throw up?" MD notified and order given for nitro drip, phenergan, and if pain not improved to follow with morphine. Orders followed. Chest pain decreased.

## 2013-08-09 LAB — TYPE AND SCREEN
Antibody Screen: NEGATIVE
Unit division: 0

## 2013-08-09 LAB — BASIC METABOLIC PANEL
BUN: 25 mg/dL — ABNORMAL HIGH (ref 6–23)
Calcium: 9.5 mg/dL (ref 8.4–10.5)
GFR calc Af Amer: 35 mL/min — ABNORMAL LOW (ref >90)
GFR calc non Af Amer: 30 mL/min — ABNORMAL LOW (ref >90)
Glucose, Bld: 101 mg/dL — ABNORMAL HIGH (ref 70–99)
Potassium: 4.1 mEq/L (ref 3.5–5.1)
Sodium: 136 mEq/L (ref 135–145)

## 2013-08-09 LAB — CBC
Hemoglobin: 8.8 g/dL — ABNORMAL LOW (ref 13.0–17.0)
MCH: 27.3 pg (ref 26.0–34.0)
MCHC: 31.7 g/dL (ref 30.0–36.0)
Platelets: 215 10*3/uL (ref 150–400)
RBC: 3.22 MIL/uL — ABNORMAL LOW (ref 4.22–5.81)

## 2013-08-09 LAB — GLUCOSE, CAPILLARY
Glucose-Capillary: 97 mg/dL (ref 70–99)
Glucose-Capillary: 163 mg/dL — ABNORMAL HIGH (ref 70–99)
Glucose-Capillary: 148 mg/dL — ABNORMAL HIGH (ref 70–99)

## 2013-08-09 LAB — OCCULT BLOOD X 1 CARD TO LAB, STOOL: Fecal Occult Bld: POSITIVE — AB

## 2013-08-09 MED ORDER — FUROSEMIDE 20 MG PO TABS
20.0000 mg | ORAL_TABLET | Freq: Every day | ORAL | Status: DC
  Administered 2013-08-10 – 2013-08-11 (×2): 20 mg via ORAL
  Filled 2013-08-09 (×2): qty 1

## 2013-08-09 NOTE — Progress Notes (Signed)
[facility] PROGRESS NOTE  Ernest Popowski ZOX:[id number] DOB: [birth year] DOA: 08/06/2013 PCP: Evlyn Courier, MD  Assessment/Plan: 1. Non-ST elevation MI. The patient's troponin was found to be greater than 25. He was seen by cardiology and started on heparin infusion. He was loaded with Plavix. Continued on aspirin and Plavix. He is on a statin. He is also on a nitroglycerin infusion. 2-D echocardiogram shows normal EF and no wall motion abnormalities. Recommendations up to this point have been for medical management. troponins are trending down. Heparin was subsequently discontinued due to anemia and concern for bleeding (although no objective evidence of bleeding).  He has not had any recurrence of chest pain, so we will discontinue nitroglycerin infusion. 2. Acute CHF, presumed diastolic. He was on IV Lasix with good urine output. He briefly did require BiPAP on admission but clinically has improved. His oxygen requirement has been minimal. Now on po lasix.  Would hold off on ACE due to renal dysfunction. 3. Acute respiratory failure due to #2. Improving 4. Rotavirus enteritis. Patient had significant diarrhea on admission. Stools tested positive for rotavirus. Management will be supportive. He reports improvement of his diarrhea. 5. Hypokalemia. Likely due to Lasix. Will replace. 6. Metabolic acidosis, patient did have elevated lactic acid. Acidosis has since improved. 7. CKD stage III. Creatinine stable.  Now on po lasix. 8. Chronic anemia, presumed iron deficiency based on review of records. Hemoglobin has slowly trended down.  No obvious signs of bleeding.  Stool occult blood has not been collected yet, but no gross melena or hematochezia.  In light of ACS, patient was transfused 1 unit prbc and follow up hemoglobin has improved. 9. Aortic stenosis, was previously classified as mild to moderate on echo from 2013. Current echo shows moderate to severe.  Code Status: Partial code, no  intubation Family Communication: Discussed with the patient's daughter over the phone on 9/11 Disposition Plan: Pending hospital course, possible transfer to medical floor if remains stable   Consultants:  Cardiology  Procedures:  None  Antibiotics:  Rocephin 9/10-9/11  HPI/Subjective: No chest pain or shortness of breath.  Feeling better.  Objective: Filed Vitals:   08/09/13 0900  BP: 168/60  Pulse: 72  Temp:   Resp: 20    Intake/Output Summary (Last 24 hours) at 08/09/13 0957 Last data filed at 08/09/13 0900  Gross per 24 hour  Intake 1292.6 ml  Output   2275 ml  Net -982.4 ml   Filed Weights   08/07/13 0500 08/08/13 0500 08/09/13 0500  Weight: 68.4 kg (150 lb 12.7 oz) 68.8 kg (151 lb 10.8 oz) 69.2 kg (152 lb 8.9 oz)    Exam:   General:  NAD  Cardiovascular: S1, S2 RRR  Respiratory: CTA B  Abdomen: soft, nt, nd, bs+  Musculoskeletal: no pedal edema.   Data Reviewed: Basic Metabolic Panel:  Recent Labs Lab 08/06/13 0321 08/07/13 0516 08/08/13 0445 08/09/13 0533  NA 137 138 133* 136  K 3.4* 3.0* 4.1 4.1  CL 99 104 99 104  CO2 13* 23 26 24   GLUCOSE 417* 118* 187* 101*  BUN 19 25* 30* 25*  CREATININE 1.86* 1.72* 1.91* 1.86*  CALCIUM 9.4 9.2 9.7 9.5   Liver Function Tests:  Recent Labs Lab 08/06/13 0321  AST 33  ALT 13  ALKPHOS 70  BILITOT 0.2*  PROT 6.5  ALBUMIN 3.0*   No results found for this basename: LIPASE, AMYLASE,  in the last 168 hours No results found for this basename: AMMONIA,  in the last 168 hours CBC:  Recent Labs Lab 08/06/13 0321 08/06/13 2123 08/07/13 0516 08/08/13 0445 08/09/13 0533  WBC 8.2  --  8.0 6.5 5.8  NEUTROABS 3.1  --   --   --   --   HGB 8.4* 8.4* 8.0* 7.5* 8.8*  HCT 27.5*  --  25.0* 23.8* 27.8*  MCV 91.7  --  86.8 87.8 86.3  PLT 320  --  224 252 215   Cardiac Enzymes:  Recent Labs Lab 08/06/13 1625 08/06/13 2123 08/07/13 0934 08/07/13 1520 08/07/13 2120  TROPONINI >20.00* >20.00*  >20.00* 16.90* 11.04*   BNP (last 3 results)  Recent Labs  08/06/13 0321  PROBNP 222.8   CBG:  Recent Labs Lab 08/08/13 0744 08/08/13 1155 08/08/13 1625 08/08/13 2131 08/09/13 0748  GLUCAP 160* 210* 141* 151* 97    Recent Results (from the past 240 hour(s))  URINE CULTURE     Status: None   Collection Time    08/06/13  4:00 AM      Result Value Range Status   Specimen Description URINE, CATHETERIZED   Final   Special Requests NONE   Final   Culture  Setup Time     Final   Value: 08/06/2013 10:40     Performed at [facility] Count     Final   Value: NO GROWTH     Performed at [facility]   Culture     Final   Value: NO GROWTH     Performed at [facility]   Report Status 08/07/2013 FINAL   Final  MRSA PCR SCREENING     Status: None   Collection Time    08/06/13  6:01 AM      Result Value Range Status   MRSA by PCR NEGATIVE  NEGATIVE Final   Comment:            The GeneXpert MRSA Assay (FDA     approved for NASAL specimens     only), is one component of a     comprehensive MRSA colonization     surveillance program. It is not     intended to diagnose MRSA     infection nor to guide or     monitor treatment for     MRSA infections.     Studies: No results found.  Scheduled Meds: . allopurinol  100 mg Oral Daily  . amLODipine  5 mg Oral Daily  . aspirin EC  325 mg Oral Daily  . atorvastatin  40 mg Oral q1800  . clopidogrel  75 mg Oral Q breakfast  . furosemide  40 mg Oral Daily  . hydrALAZINE  25 mg Oral TID  . insulin aspart  0-15 Units Subcutaneous TID WC  . insulin aspart  0-5 Units Subcutaneous QHS  . insulin glargine  24 Units Subcutaneous q morning - 10a  . metoprolol tartrate  50 mg Oral BID  . sodium chloride  3 mL Intravenous Q12H   Continuous Infusions: . nitroGLYCERIN 5 mcg/min (08/09/13 0900)    Active Problems:   UTI (urinary tract infection)   Acute renal failure   Hypokalemia   Anemia   HTN  (hypertension)   Aortic stenosis   CKD (chronic kidney disease) stage 3, GFR 30-59 ml/min   DM (diabetes mellitus) type II uncontrolled with renal manifestation   Acute respiratory failure   Acute diastolic CHF (congestive heart failure)   Metabolic acidosis   Rotavirus  enteritis   NSTEMI (non-ST elevated myocardial infarction)    Time spent:     [facility]  [facility] Pager [phone]. If 7PM-7AM, please contact night-coverage at [email or web address], password [facility] 08/09/2013, 9:57 AM  LOS: 3 days

## 2013-08-10 LAB — BASIC METABOLIC PANEL
BUN: 21 mg/dL (ref 6–23)
CO2: 24 mEq/L (ref 19–32)
Calcium: 9.9 mg/dL (ref 8.4–10.5)
Chloride: 104 mEq/L (ref 96–112)
Creatinine, Ser: 1.48 mg/dL — ABNORMAL HIGH (ref 0.50–1.35)
Glucose, Bld: 104 mg/dL — ABNORMAL HIGH (ref 70–99)

## 2013-08-10 LAB — CBC
HCT: 29.8 % — ABNORMAL LOW (ref 39.0–52.0)
Hemoglobin: 9.6 g/dL — ABNORMAL LOW (ref 13.0–17.0)
MCH: 27.7 pg (ref 26.0–34.0)
MCV: 86.1 fL (ref 78.0–100.0)
RBC: 3.46 MIL/uL — ABNORMAL LOW (ref 4.22–5.81)
WBC: 6.6 10*3/uL (ref 4.0–10.5)

## 2013-08-10 LAB — GLUCOSE, CAPILLARY
Glucose-Capillary: 88 mg/dL (ref 70–99)
Glucose-Capillary: 109 mg/dL — ABNORMAL HIGH (ref 70–99)
Glucose-Capillary: 250 mg/dL — ABNORMAL HIGH (ref 70–99)

## 2013-08-10 NOTE — Progress Notes (Addendum)
Report called to Zannie Kehr, RN and pt going to 332. Pt transferred via wheelchair along with personal hygiene items. A set of keys and remote were also taken to 332 along with patient.

## 2013-08-10 NOTE — Progress Notes (Signed)
[facility] PROGRESS NOTE  [name] ZHY:[id number] DOB: 01-Dec-1919 DOA: 08/06/2013 PCP: Evlyn Courier, MD  Assessment/Plan: 1. Non-ST elevation MI. The patient's troponin was found to be greater than 25. He was seen by cardiology and started on a heparin infusion. He was loaded with Plavix. Plan is to continue on aspirin and Plavix. He is on a statin. Nitroglycerin infusion has been discontinued and he has not had any recurrence of chest pain/shortness of breath. 2-D echocardiogram shows normal EF and no wall motion abnormalities. Recommendations up to this point have been for medical management. troponins are trending down. Heparin was subsequently discontinued due to anemia and concern for bleeding (although no evidence of gross bleeding).  2. Acute CHF, presumed diastolic. He was initially on IV Lasix with good urine output. He briefly did require BiPAP on admission but clinically has improved. He is now breathing comfortably on room air. Now on po lasix.  Would hold off on ACE due to renal dysfunction. 3. Acute respiratory failure due to #2. Improving 4. Rotavirus enteritis. Patient had significant diarrhea on admission. Stools tested positive for rotavirus. Management will be supportive. He reports improvement of his diarrhea. 5. Heme positive stools. Patient does not have any gross evidence of melena or hematochezia.  Hemoglobin has trended up in the last 24 hours. In light of enteritis, would not pursue further work up at this time unless patient develops gross evidence of bleeding, or hemoglobin starts dropping. Continue aspirin and plavix for now. 6. Hypokalemia. Likely due to Lasix. Will replace. 7. Metabolic acidosis, patient did have elevated lactic acid on admission. Acidosis has since improved. 8. CKD stage III. Creatinine stable.  Now on po lasix. 9. Chronic anemia, presumed iron deficiency based on review of records. Hemoglobin has slowly trended down. In light of ACS, patient  was transfused 1 unit prbc and follow up hemoglobin has improved. He does have heme positive stools but no evidence of gross GI bleeding.  Will continue to follow serial hemoglobins. 10. Aortic stenosis, was previously classified as mild to moderate on echo from 2013. Current echo shows moderate to severe.  Code Status: Partial code, no intubation Family Communication: Discussed with the patient's daughter over the phone on 9/11 Disposition Plan: transfer to telemetry bed. Anticipate discharge home in the next 24 hours.   Consultants:  Cardiology  Procedures:  None  Antibiotics:  Rocephin 9/10-9/11  HPI/Subjective: No new complaints, feeling better  Objective: Filed Vitals:   08/10/13 0800  BP: 156/89  Pulse:   Temp:   Resp: 16    Intake/Output Summary (Last 24 hours) at 08/10/13 0920 Last data filed at 08/10/13 0800  Gross per 24 hour  Intake    480 ml  Output   2460 ml  Net  -1980 ml   Filed Weights   08/08/13 0500 08/09/13 0500 08/10/13 0500  Weight: 68.8 kg (151 lb 10.8 oz) 69.2 kg (152 lb 8.9 oz) 70.4 kg (155 lb 3.3 oz)    Exam:   General:  NAD  Cardiovascular: S1, S2 RRR  Respiratory: CTA B  Abdomen: soft, nt, nd, bs+  Musculoskeletal: no pedal edema.   Data Reviewed: Basic Metabolic Panel:  Recent Labs Lab 08/06/13 0321 08/07/13 0516 08/08/13 0445 08/09/13 0533 08/10/13 0439  NA 137 138 133* 136 138  K 3.4* 3.0* 4.1 4.1 4.0  CL 99 104 99 104 104  CO2 13* 23 26 24 24   GLUCOSE 417* 118* 187* 101* 104*  BUN 19 25* 30* 25* 21  CREATININE 1.86* 1.72* 1.91* 1.86* 1.48*  CALCIUM 9.4 9.2 9.7 9.5 9.9   Liver Function Tests:  Recent Labs Lab 08/06/13 0321  AST 33  ALT 13  ALKPHOS 70  BILITOT 0.2*  PROT 6.5  ALBUMIN 3.0*   No results found for this basename: LIPASE, AMYLASE,  in the last 168 hours No results found for this basename: AMMONIA,  in the last 168 hours CBC:  Recent Labs Lab 08/06/13 0321 08/06/13 2123  08/07/13 0516 08/08/13 0445 08/09/13 0533 08/10/13 0439  WBC 8.2  --  8.0 6.5 5.8 6.6  NEUTROABS 3.1  --   --   --   --   --   HGB 8.4* 8.4* 8.0* 7.5* 8.8* 9.6*  HCT 27.5*  --  25.0* 23.8* 27.8* 29.8*  MCV 91.7  --  86.8 87.8 86.3 86.1  PLT 320  --  224 252 215 222   Cardiac Enzymes:  Recent Labs Lab 08/06/13 1625 08/06/13 2123 08/07/13 0934 08/07/13 1520 08/07/13 2120  TROPONINI >20.00* >20.00* >20.00* 16.90* 11.04*   BNP (last 3 results)  Recent Labs  08/06/13 0321  PROBNP 222.8   CBG:  Recent Labs Lab 08/09/13 0748 08/09/13 1211 08/09/13 1723 08/09/13 2144 08/10/13 0745  GLUCAP 97 155* 163* 148* 88    Recent Results (from the past 240 hour(s))  URINE CULTURE     Status: None   Collection Time    08/06/13  4:00 AM      Result Value Range Status   Specimen Description URINE, CATHETERIZED   Final   Special Requests NONE   Final   Culture  Setup Time     Final   Value: 08/06/2013 10:40     Performed at [facility] Count     Final   Value: NO GROWTH     Performed at [facility]   Culture     Final   Value: NO GROWTH     Performed at [facility]   Report Status 08/07/2013 FINAL   Final  MRSA PCR SCREENING     Status: None   Collection Time    08/06/13  6:01 AM      Result Value Range Status   MRSA by PCR NEGATIVE  NEGATIVE Final   Comment:            The GeneXpert MRSA Assay (FDA     approved for NASAL specimens     only), is one component of a     comprehensive MRSA colonization     surveillance program. It is not     intended to diagnose MRSA     infection nor to guide or     monitor treatment for     MRSA infections.     Studies: No results found.  Scheduled Meds: . allopurinol  100 mg Oral Daily  . amLODipine  5 mg Oral Daily  . aspirin EC  325 mg Oral Daily  . atorvastatin  40 mg Oral q1800  . clopidogrel  75 mg Oral Q breakfast  . furosemide  20 mg Oral Daily  . hydrALAZINE  25 mg Oral TID  .  insulin aspart  0-15 Units Subcutaneous TID WC  . insulin aspart  0-5 Units Subcutaneous QHS  . insulin glargine  24 Units Subcutaneous q morning - 10a  . metoprolol tartrate  50 mg Oral BID  . sodium chloride  3 mL Intravenous Q12H   Continuous Infusions:  Active Problems:   UTI (urinary tract infection)   Acute renal failure   Hypokalemia   Anemia   HTN (hypertension)   Aortic stenosis   CKD (chronic kidney disease) stage 3, GFR 30-59 ml/min   DM (diabetes mellitus) type II uncontrolled with renal manifestation   Acute respiratory failure   Acute diastolic CHF (congestive heart failure)   Metabolic acidosis   Rotavirus enteritis   NSTEMI (non-ST elevated myocardial infarction)    Time spent:     [facility]  [facility] Pager [phone]. If 7PM-7AM, please contact night-coverage at [email or web address], password [facility] 08/10/2013, 9:20 AM  LOS: 4 days

## 2013-08-11 DIAGNOSIS — I214 Non-ST elevation (NSTEMI) myocardial infarction: Secondary | ICD-10-CM

## 2013-08-11 DIAGNOSIS — I509 Heart failure, unspecified: Secondary | ICD-10-CM

## 2013-08-11 DIAGNOSIS — I5031 Acute diastolic (congestive) heart failure: Secondary | ICD-10-CM

## 2013-08-11 LAB — BASIC METABOLIC PANEL
Calcium: 10.1 mg/dL (ref 8.4–10.5)
Chloride: 101 mEq/L (ref 96–112)
Creatinine, Ser: 1.47 mg/dL — ABNORMAL HIGH (ref 0.50–1.35)
GFR calc Af Amer: 46 mL/min — ABNORMAL LOW (ref >90)

## 2013-08-11 LAB — GLUCOSE, CAPILLARY

## 2013-08-11 LAB — CBC
MCH: 27.6 pg (ref 26.0–34.0)
MCV: 86 fL (ref 78.0–100.0)
Platelets: 228 10*3/uL (ref 150–400)
RDW: 16 % — ABNORMAL HIGH (ref 11.5–15.5)
WBC: 5.9 10*3/uL (ref 4.0–10.5)

## 2013-08-11 MED ORDER — ASPIRIN EC 81 MG PO TBEC: 81.0000 mg | DELAYED_RELEASE_TABLET | Freq: Every day | ORAL | Status: DC

## 2013-08-11 MED ORDER — INSULIN GLARGINE 100 UNIT/ML ~~LOC~~ SOLN: 24.0000 [IU] | Freq: Every morning | SUBCUTANEOUS | Status: DC

## 2013-08-11 MED ORDER — HYDRALAZINE HCL 50 MG PO TABS: 50.0000 mg | ORAL_TABLET | Freq: Three times a day (TID) | ORAL | Status: AC

## 2013-08-11 MED ORDER — ASPIRIN 81 MG PO TBEC: 81.0000 mg | DELAYED_RELEASE_TABLET | Freq: Every day | ORAL | Status: DC

## 2013-08-11 MED ORDER — CLOPIDOGREL BISULFATE 75 MG PO TABS: 75.0000 mg | ORAL_TABLET | Freq: Every day | ORAL | Status: DC

## 2013-08-11 MED ORDER — FUROSEMIDE 20 MG PO TABS: 20.0000 mg | ORAL_TABLET | Freq: Every day | ORAL | Status: DC

## 2013-08-11 MED ORDER — ATORVASTATIN CALCIUM 40 MG PO TABS: 40.0000 mg | ORAL_TABLET | Freq: Every day | ORAL | Status: DC

## 2013-08-11 MED ORDER — METOPROLOL TARTRATE 50 MG PO TABS: 50.0000 mg | ORAL_TABLET | Freq: Two times a day (BID) | ORAL | Status: AC

## 2013-08-11 NOTE — Discharge Summary (Signed)
Physician Discharge Summary  [name] ZOX:[id number] DOB: 11/14/1920 DOA: 08/06/2013  PCP: Evlyn Courier, MD  Admit date: 08/06/2013 Discharge date: 08/11/2013  Time spent: 40 minutes  Recommendations for Outpatient Follow-up:  1. Patient will be discharged home today. He's been set up with [facility]. 2. Followup with primary care physician in the next one to 2 weeks 3. Follow up with cardiology as an outpatient.  Discharge Diagnoses:  Active Problems:   UTI (urinary tract infection)   Acute renal failure   Hypokalemia   Anemia   HTN (hypertension)   Aortic stenosis   CKD (chronic kidney disease) stage 3, GFR 30-59 ml/min   DM (diabetes mellitus) type II uncontrolled with renal manifestation   Acute respiratory failure   Acute diastolic CHF (congestive heart failure)   Metabolic acidosis   Rotavirus enteritis   NSTEMI (non-ST elevated myocardial infarction)   Discharge Condition: improved  Diet recommendation: low salt, low carb  Filed Weights   08/10/13 0500 08/11/13 0500 08/11/13 0606  Weight: 70.4 kg (155 lb 3.3 oz) 67.7 kg (149 lb 4 oz) 67.6 kg (149 lb 0.5 oz)    History of present illness:  [name] is a 79 y.o. male history of hypertension, diabetes, chronic kidney disease stage III as well as mild to moderate aortic stenosis. Patient reports being in his usual state of health yesterday when he developed acute onset of nausea and vomiting. He felt that it may be related to something he ate. This was followed by progressive shortness of breath. Patient's shortness of breath progressed rather quickly. He denied any chest. He felt that he was coughing some pink/bloody frothy sputum. No documented history fevers. He did feel diaphoretic with shortness of breath. He did not have any diarrhea while at home but has had 4 episodes since arriving to the hospital. Denies any melena hematochezia. When his breathing did not improve, he called EMS. He is brought to  the hospital with severe dyspnea/tachypnea. He was placed on BiPAP. Chest x-ray indicated bilateral infiltrates/edema. He was started on nitroglycerin infusion and was received IV Lasix. He's been referred for admission.   Hospital Course:  This patient was admitted to the hospital for shortness of breath. He was found to have acute diastolic congestive heart failure. He was placed on IV Lasix as well as BiPAP in the emergency room. The patient's respiratory status gradually improved. He was noted to have elevated cardiac enzymes consistent with a non-ST elevation MI. He was started on nitroglycerin infusion as well as heparin infusion. He was seen by cardiology and felt that medical management would be the most appropriate course of action. His chest pain shortness of breath since resolved. 2-D echocardiogram was done which showed preserved ejection fraction as well as no wall motion abnormalities. He's been placed on aspirin and Plavix as well as beta blocker, statin. ACE inhibitor was not prescribed due to history of chronic kidney disease and renal dysfunction. He's also low-dose Lasix. Echocardiogram also showed progression of his aortic stenosis, now classified as severe. He's not felt to be appropriate operative candidate. He will follow up with cardiology in the outpatient setting for further management.  The patient was noted to have heme positive stools. He did not have any gross evidence of melena or hematochezia. He did require one unit of PRBC during his hospitalization, but this was felt more to be due to anemia of chronic disease rather than acute blood loss. He's been advised to watch out for any  signs of gross GI bleeding. He'll be set up to see GI as an outpatient to further discuss any further workup if felt appropriate.  He did have some diarrhea and vomiting prior to admission. Stools test did test positive for rotavirus. The patient is now clinically improved. This may also be  contributing to his heme positive stools. The patient was seen by physical therapy and did not need any further physical therapy. He can be discharged home today.  Procedures: Echo: - Left ventricle: The cavity size was at the lower limits of normal. Wall thickness was increased increased in a pattern of mild to moderate LVH. Systolic function was normal. The estimated ejection fraction was in the range of 50% to 55%. Wall motion was normal; there were no regional wall motion abnormalities. Diastolic function is indeterminate, there is evidence of elevated left atrial pressure. - Aortic valve: Mildly calcified annulus. Trileaflet; severely thickened leaflets. The left coronary cusps is heavily calcified with severely restricted movement. There was moderate to severe stenosis. Majority of parameters meet criteria for severe stenosis, gradients likely moderate due to Doppler alignment. Morphologically and by continuity equation the stenosis appears severe. Mild regurgitation. Valve area: 0.78cm^2(VTI). Valve area: 0.73cm^2 (Vmax). - Mitral valve: Mildly calcified annulus. Mildly thickened leaflets . Mild regurgitation. - Left atrium: The atrium was moderately dilated. - Pulmonary arteries: Systolic pressure was moderately increased, estimated to be 49mm Hg.    Consultations: Cardiology  Discharge Exam: Filed Vitals:   08/11/13 0606  BP: 161/64  Pulse: 68  Temp: 98.8 F (37.1 C)  Resp: 18    General: NAD Cardiovascular: S1, S2 RRR Respiratory: CTA B  Discharge Instructions  Discharge Orders   Future Appointments Provider Department Dept Phone   08/25/2013 2:10 PM Jodelle Gross, NP [facility] [place] [phone]   Future Orders Complete By Expires   (HEART FAILURE PATIENTS) Call MD:  Anytime you have any of the following symptoms: 1) 3 pound weight gain in 24 hours or 5 pounds in 1 week 2) shortness of breath, with or without a dry hacking cough 3)  swelling in the hands, feet or stomach 4) if you have to sleep on extra pillows at night in order to breathe.  As directed    Call MD for:  difficulty breathing, headache or visual disturbances  As directed    Diet - low sodium heart healthy  As directed    Diet Carb Modified  As directed    Increase activity slowly  As directed        Medication List    STOP taking these medications       cloNIDine-chlorthalidone 0.1-15 MG per tablet  Commonly known as:  CLORPRES     lisinopril 5 MG tablet  Commonly known as:  PRINIVIL,ZESTRIL      TAKE these medications       allopurinol 100 MG tablet  Commonly known as:  ZYLOPRIM  Take 100 mg by mouth daily.     amLODipine 5 MG tablet  Commonly known as:  NORVASC  Take 5 mg by mouth daily.     aspirin 81 MG EC tablet  Take 1 tablet (81 mg total) by mouth daily.  Start taking on:  08/12/2013     atorvastatin 40 MG tablet  Commonly known as:  LIPITOR  Take 1 tablet (40 mg total) by mouth daily at 6 PM.     clopidogrel 75 MG tablet  Commonly known as:  PLAVIX  Take 1 tablet (  75 mg total) by mouth daily with breakfast.     furosemide 20 MG tablet  Commonly known as:  LASIX  Take 1 tablet (20 mg total) by mouth daily.     hydrALAZINE 50 MG tablet  Commonly known as:  APRESOLINE  Take 1 tablet (50 mg total) by mouth 3 (three) times daily.     insulin glargine 100 UNIT/ML injection  Commonly known as:  LANTUS  Inject 0.24 mLs (24 Units total) into the skin every morning. Inject 23 units if blood sugars are below 150 or inject 28 units if blood sugars are above 150     metoprolol 50 MG tablet  Commonly known as:  LOPRESSOR  Take 1 tablet (50 mg total) by mouth 2 (two) times daily.       No Known Allergies     Follow-up Information   Follow up with HILL,GERALD K, MD. Schedule an appointment as soon as possible for a visit in 2 weeks.   Specialty:  Family Medicine   Contact information:   [address] [place] [address] [phone]       Follow up with Laqueta Linden, MD.   Specialty:  Cardiology   Contact information:   [address] [place] [address] [phone]        The results of significant diagnostics from this hospitalization (including imaging, microbiology, ancillary and laboratory) are listed below for reference.    Significant Diagnostic Studies: Dg Chest Port 1 View  08/07/2013   *RADIOLOGY REPORT*  Clinical Data: Congestive heart failure  PORTABLE CHEST - 1 VIEW  Comparison: August 06, 2013.  Findings: Cardiomediastinal silhouette appears normal. Degenerative changes of lower thoracic spine are noted.  No pleural effusion or pneumothorax is noted.  The diffuse airspace opacity noted on prior exam appears significantly improved with only minimal residual density remaining.  Bony thorax is intact.  IMPRESSION: Bilateral airspace opacities noted on prior exam are significantly improved and nearly resolved.   Original Report Authenticated By: Lupita Raider.,  M.D.   Dg Chest Portable 1 View  08/06/2013   *RADIOLOGY REPORT*  Clinical Data: Shortness of breath and respiratory distress.  PORTABLE CHEST - 1 VIEW  Comparison: 04/27/2013  Findings: Heart size is normal.  There is diffuse parenchymal airspace disease, new since previous study.  This could be due to edema, pneumonia, or other infiltrative process.  Suggestion of small left pleural effusion.  No pneumothorax.  Calcification of the aorta.  Pulmonary vascularity is obscured by the parenchymal process.  Degenerative changes in the spine and shoulders.  IMPRESSION: Diffuse airspace infiltration in both lungs.   Original Report Authenticated By: Burman Nieves, M.D.    Microbiology: Recent Results (from the past 240 hour(s))  URINE CULTURE     Status: None   Collection Time    08/06/13  4:00 AM      Result Value Range Status   Specimen Description URINE, CATHETERIZED   Final   Special Requests  NONE   Final   Culture  Setup Time     Final   Value: 08/06/2013 10:40     Performed at [facility] Count     Final   Value: NO GROWTH     Performed at [facility]   Culture     Final   Value: NO GROWTH     Performed at [facility]   Report Status 08/07/2013 FINAL   Final  MRSA PCR SCREENING     Status: None   Collection Time    08/06/13  6:01 AM      Result Value Range Status   MRSA by PCR NEGATIVE  NEGATIVE Final   Comment:            The GeneXpert MRSA Assay (FDA     approved for NASAL specimens     only), is one component of a     comprehensive MRSA colonization     surveillance program. It is not     intended to diagnose MRSA     infection nor to guide or     monitor treatment for     MRSA infections.     Labs: Basic Metabolic Panel:  Recent Labs Lab 08/07/13 0516 08/08/13 0445 08/09/13 0533 08/10/13 0439 08/11/13 0456  NA 138 133* 136 138 136  K 3.0* 4.1 4.1 4.0 3.9  CL 104 99 104 104 101  CO2 23 26 24 24 26   GLUCOSE 118* 187* 101* 104* 130*  BUN 25* 30* 25* 21 24*  CREATININE 1.72* 1.91* 1.86* 1.48* 1.47*  CALCIUM 9.2 9.7 9.5 9.9 10.1   Liver Function Tests:  Recent Labs Lab 08/06/13 0321  AST 33  ALT 13  ALKPHOS 70  BILITOT 0.2*  PROT 6.5  ALBUMIN 3.0*   No results found for this basename: LIPASE, AMYLASE,  in the last 168 hours No results found for this basename: AMMONIA,  in the last 168 hours CBC:  Recent Labs Lab 08/06/13 0321  08/07/13 0516 08/08/13 0445 08/09/13 0533 08/10/13 0439 08/11/13 0456  WBC 8.2  --  8.0 6.5 5.8 6.6 5.9  NEUTROABS 3.1  --   --   --   --   --   --   HGB 8.4*  < > 8.0* 7.5* 8.8* 9.6* 9.7*  HCT 27.5*  --  25.0* 23.8* 27.8* 29.8* 30.2*  MCV 91.7  --  86.8 87.8 86.3 86.1 86.0  PLT 320  --  224 252 215 222 228  < > = values in this interval not displayed. Cardiac Enzymes:  Recent Labs Lab 08/06/13 1625 08/06/13 2123 08/07/13 0934 08/07/13 1520 08/07/13 2120   TROPONINI >20.00* >20.00* >20.00* 16.90* 11.04*   BNP: BNP (last 3 results)  Recent Labs  08/06/13 0321  PROBNP 222.8   CBG:  Recent Labs Lab 08/10/13 1634 08/10/13 2108 08/10/13 2252 08/11/13 0712 08/11/13 1136  GLUCAP 109* 250* 181* 126* 163*       Signed:  Rianne Degraaf  [facility] 08/11/2013, 12:23 PM

## 2013-08-11 NOTE — Progress Notes (Signed)
   Consulting cardiologist: Dr. Prentice Docker  Subjective:   No chest pain or breathlessness at rest.   Objective:   Temp:  [98.6 F (37 C)-98.8 F (37.1 C)] 98.8 F (37.1 C) (09/15 0606) Pulse Rate:  [68-76] 68 (09/15 0606) Resp:  [16-22] 18 (09/15 0606) BP: (153-190)/(41-95) 161/64 mmHg (09/15 0606) SpO2:  [95 %-99 %] 95 % (09/15 0606) Weight:  [149 lb 0.5 oz (67.6 kg)-149 lb 4 oz (67.7 kg)] 149 lb 0.5 oz (67.6 kg) (09/15 0606) Last BM Date: 08/10/13  Filed Weights   08/10/13 0500 08/11/13 0500 08/11/13 0606  Weight: 155 lb 3.3 oz (70.4 kg) 149 lb 4 oz (67.7 kg) 149 lb 0.5 oz (67.6 kg)    Intake/Output Summary (Last 24 hours) at 08/11/13 1040 Last data filed at 08/11/13 0607  Gross per 24 hour  Intake      0 ml  Output    700 ml  Net   -700 ml    Telemetry: Sinus rhythm.  Exam:  General: Appears comfortable at rest.  Lungs: Clear, diminished breath sounds.  Cardiac: RRR, no gallop, 2/6 systolic murmur at the base.  Abdomen: NABS.  Extremities: No pitting.   Lab Results:  Basic Metabolic Panel:  Recent Labs Lab 08/09/13 0533 08/10/13 0439 08/11/13 0456  NA 136 138 136  K 4.1 4.0 3.9  CL 104 104 101  CO2 24 24 26   GLUCOSE 101* 104* 130*  BUN 25* 21 24*  CREATININE 1.86* 1.48* 1.47*  CALCIUM 9.5 9.9 10.1    CBC:  Recent Labs Lab 08/09/13 0533 08/10/13 0439 08/11/13 0456  WBC 5.8 6.6 5.9  HGB 8.8* 9.6* 9.7*  HCT 27.8* 29.8* 30.2*  MCV 86.3 86.1 86.0  PLT 215 222 228    Cardiac Enzymes:  Recent Labs Lab 08/07/13 0934 08/07/13 1520 08/07/13 2120  TROPONINI >20.00* 16.90* 11.04*     Medications:   Scheduled Medications: . allopurinol  100 mg Oral Daily  . amLODipine  5 mg Oral Daily  . aspirin EC  325 mg Oral Daily  . atorvastatin  40 mg Oral q1800  . clopidogrel  75 mg Oral Q breakfast  . furosemide  20 mg Oral Daily  . hydrALAZINE  25 mg Oral TID  . insulin aspart  0-15 Units Subcutaneous TID WC  . insulin aspart   0-5 Units Subcutaneous QHS  . insulin glargine  24 Units Subcutaneous q morning - 10a  . metoprolol tartrate  50 mg Oral BID  . sodium chloride  3 mL Intravenous Q12H      PRN Medications:  sodium chloride, acetaminophen, alum & mag hydroxide-simeth, morphine injection, nitroGLYCERIN, ondansetron (ZOFRAN) IV, sodium chloride   Assessment:   1. NSTEMI, managed medically as outlined previously. Regimen reviewed. He has been clinically stable. LVEF 50-55% with concurrent diastolic dysfunction.  2. Severe aortic stenosis. For conservative management at this point.  3. Hypertension, continue current regimen.  4. Anemia, with heme positive stools. This limits anticoagulation, may limit DAPT going forward as well.   Plan/Discussion:    Patient for possible discharge home today. Would continue current regimen, reduce aspirin 81 mg daily. We will schedule a followup visit with Dr. Purvis Sheffield.   Jonelle Sidle, M.D., F.A.C.C.

## 2013-08-11 NOTE — Evaluation (Signed)
Physical Therapy Evaluation Patient Details Name: [name] MRN: [id number] DOB: [birth year] Today's Date: 08/11/2013 Time: 4098-1191 PT Time Calculation (min): 24 min  PT Assessment / Plan / Recommendation History of Present Illness   Pt is admitted with acute respiratory failure due to CHF.  He also has  Acute renal failure and possible UTI.  He lives alone in an apartment.  Clinical Impression    Pt was seen for evaluation and found to be at baseline functional status.  He states that he is normally independent at home, including driving.  He states that he reads a lot and referenced "all of the books over here" and pointed to the side of his bed.  There were no books there.  During my visit, a close friend came to visit.  She was very concerned about his living alone and has been trying to talk him into going to ACLF.  He refuses stating that he doesn't want "to be around all of those old people".  Friend states that he needs someone in the home with him.  I would agree.   I have spoken about concerns with Nurse Case Manager.    PT Assessment  Patent does not need any further PT services    Follow Up Recommendations  No PT follow up (pt declines HHPT)    Does the patient have the potential to tolerate intense rehabilitation      Barriers to Discharge        Equipment Recommendations  None recommended by PT    Recommendations for Other Services     Frequency      Precautions / Restrictions Precautions Precautions: None Restrictions Weight Bearing Restrictions: No   Pertinent Vitals/Pain       Mobility  Bed Mobility Bed Mobility: Supine to Sit Supine to Sit: 7: Independent;HOB flat Transfers Transfers: Sit to Stand;Stand to Sit Sit to Stand: 7: Independent;From bed Stand to Sit: 7: Independent;To bed Ambulation/Gait Ambulation/Gait Assistance: 6: Modified independent (Device/Increase time) Ambulation Distance (Feet): 300 Feet Assistive device: None Gait  Pattern: Within Functional Limits Gait velocity: WNL Stairs: No Wheelchair Mobility Wheelchair Mobility: No    Exercises     PT Diagnosis:    PT Problem List:   PT Treatment Interventions:       PT Goals(Current goals can be found in the care plan section) Acute Rehab PT Goals PT Goal Formulation: No goals set, d/c therapy  Visit Information  Last PT Received On: 08/11/13       Prior Functioning  Home Living Family/patient expects to be discharged to:: Private residence Living Arrangements: Alone Available Help at Discharge: Friend(s);Available PRN/intermittently Type of Home: Apartment Home Access: Level entry Home Layout: One level Home Equipment: Walker - 2 wheels;Cane - quad Prior Function Level of Independence: Independent Communication Communication: No difficulties    Cognition  Cognition Arousal/Alertness: Awake/alert Behavior During Therapy: WFL for tasks assessed/performed Overall Cognitive Status: Within Functional Limits for tasks assessed    Extremity/Trunk Assessment Lower Extremity Assessment Lower Extremity Assessment: Overall WFL for tasks assessed   Balance Balance Balance Assessed: Yes High Level Balance High Level Balance Activites: Side stepping;Backward walking;Direction changes;Turns;Head turns High Level Balance Comments: no LOB with above  End of Session PT - End of Session Equipment Utilized During Treatment: Gait belt Activity Tolerance: Patient tolerated treatment well Patient left: in bed;with bed alarm set  GP     Myrlene Broker L 08/11/2013, 11:16 AM

## 2013-08-16 ENCOUNTER — Emergency Department (HOSPITAL_COMMUNITY): Payer: Medicare Other

## 2013-08-16 ENCOUNTER — Other Ambulatory Visit: Payer: Self-pay

## 2013-08-16 ENCOUNTER — Encounter (HOSPITAL_COMMUNITY): Payer: Self-pay | Admitting: Emergency Medicine

## 2013-08-16 ENCOUNTER — Inpatient Hospital Stay (HOSPITAL_COMMUNITY)
Admission: EM | Admit: 2013-08-16 | Discharge: 2013-08-21 | DRG: 377 | Disposition: A | Discharge status: Home / Self Care | Payer: Medicare Other | Attending: Internal Medicine | Admitting: Internal Medicine

## 2013-08-16 DIAGNOSIS — D649 Anemia, unspecified: Secondary | ICD-10-CM

## 2013-08-16 DIAGNOSIS — N179 Acute kidney failure, unspecified: Secondary | ICD-10-CM

## 2013-08-16 DIAGNOSIS — I509 Heart failure, unspecified: Secondary | ICD-10-CM | POA: Diagnosis present

## 2013-08-16 DIAGNOSIS — E611 Iron deficiency: Secondary | ICD-10-CM

## 2013-08-16 DIAGNOSIS — I5032 Chronic diastolic (congestive) heart failure: Secondary | ICD-10-CM

## 2013-08-16 DIAGNOSIS — J9600 Acute respiratory failure, unspecified whether with hypoxia or hypercapnia: Secondary | ICD-10-CM

## 2013-08-16 DIAGNOSIS — M129 Arthropathy, unspecified: Secondary | ICD-10-CM | POA: Diagnosis present

## 2013-08-16 DIAGNOSIS — E872 Acidosis, unspecified: Secondary | ICD-10-CM

## 2013-08-16 DIAGNOSIS — I10 Essential (primary) hypertension: Secondary | ICD-10-CM

## 2013-08-16 DIAGNOSIS — I2510 Atherosclerotic heart disease of native coronary artery without angina pectoris: Secondary | ICD-10-CM | POA: Diagnosis present

## 2013-08-16 DIAGNOSIS — E860 Dehydration: Secondary | ICD-10-CM

## 2013-08-16 DIAGNOSIS — Z8546 Personal history of malignant neoplasm of prostate: Secondary | ICD-10-CM

## 2013-08-16 DIAGNOSIS — IMO0002 Reserved for concepts with insufficient information to code with codable children: Secondary | ICD-10-CM

## 2013-08-16 DIAGNOSIS — A080 Rotaviral enteritis: Secondary | ICD-10-CM

## 2013-08-16 DIAGNOSIS — N183 Chronic kidney disease, stage 3 unspecified: Secondary | ICD-10-CM

## 2013-08-16 DIAGNOSIS — K31811 Angiodysplasia of stomach and duodenum with bleeding: Principal | ICD-10-CM | POA: Diagnosis present

## 2013-08-16 DIAGNOSIS — E876 Hypokalemia: Secondary | ICD-10-CM

## 2013-08-16 DIAGNOSIS — Z87891 Personal history of nicotine dependence: Secondary | ICD-10-CM

## 2013-08-16 DIAGNOSIS — K219 Gastro-esophageal reflux disease without esophagitis: Secondary | ICD-10-CM

## 2013-08-16 DIAGNOSIS — K922 Gastrointestinal hemorrhage, unspecified: Secondary | ICD-10-CM

## 2013-08-16 DIAGNOSIS — E1165 Type 2 diabetes mellitus with hyperglycemia: Secondary | ICD-10-CM | POA: Diagnosis present

## 2013-08-16 DIAGNOSIS — I219 Acute myocardial infarction, unspecified: Secondary | ICD-10-CM | POA: Diagnosis present

## 2013-08-16 DIAGNOSIS — D509 Iron deficiency anemia, unspecified: Secondary | ICD-10-CM

## 2013-08-16 DIAGNOSIS — M109 Gout, unspecified: Secondary | ICD-10-CM

## 2013-08-16 DIAGNOSIS — I5031 Acute diastolic (congestive) heart failure: Secondary | ICD-10-CM

## 2013-08-16 DIAGNOSIS — R7989 Other specified abnormal findings of blood chemistry: Secondary | ICD-10-CM

## 2013-08-16 DIAGNOSIS — I359 Nonrheumatic aortic valve disorder, unspecified: Secondary | ICD-10-CM | POA: Diagnosis present

## 2013-08-16 DIAGNOSIS — E871 Hypo-osmolality and hyponatremia: Secondary | ICD-10-CM

## 2013-08-16 DIAGNOSIS — I214 Non-ST elevation (NSTEMI) myocardial infarction: Secondary | ICD-10-CM

## 2013-08-16 DIAGNOSIS — I252 Old myocardial infarction: Secondary | ICD-10-CM | POA: Insufficient documentation

## 2013-08-16 DIAGNOSIS — Z794 Long term (current) use of insulin: Secondary | ICD-10-CM

## 2013-08-16 DIAGNOSIS — N390 Urinary tract infection, site not specified: Secondary | ICD-10-CM

## 2013-08-16 DIAGNOSIS — I129 Hypertensive chronic kidney disease with stage 1 through stage 4 chronic kidney disease, or unspecified chronic kidney disease: Secondary | ICD-10-CM | POA: Diagnosis present

## 2013-08-16 DIAGNOSIS — I350 Nonrheumatic aortic (valve) stenosis: Secondary | ICD-10-CM

## 2013-08-16 DIAGNOSIS — A419 Sepsis, unspecified organism: Secondary | ICD-10-CM

## 2013-08-16 DIAGNOSIS — D62 Acute posthemorrhagic anemia: Secondary | ICD-10-CM

## 2013-08-16 DIAGNOSIS — N058 Unspecified nephritic syndrome with other morphologic changes: Secondary | ICD-10-CM | POA: Diagnosis present

## 2013-08-16 DIAGNOSIS — E1129 Type 2 diabetes mellitus with other diabetic kidney complication: Secondary | ICD-10-CM | POA: Diagnosis present

## 2013-08-16 HISTORY — DX: Iron deficiency anemia, unspecified: D50.9

## 2013-08-16 HISTORY — DX: Nonrheumatic aortic (valve) stenosis: I35.0

## 2013-08-16 HISTORY — DX: Chronic kidney disease, stage 3 (moderate): N18.3

## 2013-08-16 HISTORY — DX: Other fecal abnormalities: R19.5

## 2013-08-16 HISTORY — DX: Chronic kidney disease, stage 3 unspecified: N18.30

## 2013-08-16 LAB — CBC WITH DIFFERENTIAL/PLATELET
Basophils Relative: 0 % (ref 0–1)
Eosinophils Absolute: 0 10*3/uL (ref 0.0–0.7)
Eosinophils Relative: 0 % (ref 0–5)
HCT: 19.1 % — ABNORMAL LOW (ref 39.0–52.0)
Lymphs Abs: 0.9 10*3/uL (ref 0.7–4.0)
MCH: 28.8 pg (ref 26.0–34.0)
MCV: 87.2 fL (ref 78.0–100.0)
Monocytes Absolute: 0.5 10*3/uL (ref 0.1–1.0)
Neutro Abs: 5.9 10*3/uL (ref 1.7–7.7)
Platelets: 287 10*3/uL (ref 150–400)
RBC: 2.19 MIL/uL — ABNORMAL LOW (ref 4.22–5.81)
RDW: 16.1 % — ABNORMAL HIGH (ref 11.5–15.5)

## 2013-08-16 LAB — COMPREHENSIVE METABOLIC PANEL
BUN: 43 mg/dL — ABNORMAL HIGH (ref 6–23)
CO2: 22 mEq/L (ref 19–32)
Calcium: 9.6 mg/dL (ref 8.4–10.5)
Creatinine, Ser: 1.8 mg/dL — ABNORMAL HIGH (ref 0.50–1.35)
GFR calc Af Amer: 36 mL/min — ABNORMAL LOW (ref >90)
GFR calc non Af Amer: 31 mL/min — ABNORMAL LOW (ref >90)
Glucose, Bld: 205 mg/dL — ABNORMAL HIGH (ref 70–99)
Total Bilirubin: <0.1 mg/dL — ABNORMAL LOW (ref 0.3–1.2)

## 2013-08-16 LAB — HEMATOCRIT: HCT: 21.5 % — ABNORMAL LOW (ref 39.0–52.0)

## 2013-08-16 LAB — HEMOGLOBIN: Hemoglobin: 7.3 g/dL — ABNORMAL LOW (ref 13.0–17.0)

## 2013-08-16 LAB — GLUCOSE, CAPILLARY: Glucose-Capillary: 189 mg/dL — ABNORMAL HIGH (ref 70–99)

## 2013-08-16 LAB — PREPARE RBC (CROSSMATCH)

## 2013-08-16 MED ORDER — ONDANSETRON HCL 4 MG PO TABS: 4.0000 mg | ORAL_TABLET | Freq: Four times a day (QID) | ORAL | Status: DC | PRN

## 2013-08-16 MED ORDER — INSULIN ASPART 100 UNIT/ML ~~LOC~~ SOLN
0.0000 [IU] | SUBCUTANEOUS | Status: DC
  Administered 2013-08-16: 2 [IU] via SUBCUTANEOUS

## 2013-08-16 MED ORDER — ASPIRIN EC 81 MG PO TBEC
81.0000 mg | DELAYED_RELEASE_TABLET | Freq: Every day | ORAL | Status: DC
  Administered 2013-08-17 – 2013-08-21 (×5): 81 mg via ORAL
  Filled 2013-08-16 (×7): qty 1

## 2013-08-16 MED ORDER — ONDANSETRON HCL 4 MG/2ML IJ SOLN: 4.0000 mg | Freq: Four times a day (QID) | INTRAMUSCULAR | Status: DC | PRN

## 2013-08-16 MED ORDER — CLOPIDOGREL BISULFATE 75 MG PO TABS
75.0000 mg | ORAL_TABLET | Freq: Every day | ORAL | Status: DC
  Filled 2013-08-16 (×2): qty 1

## 2013-08-16 MED ORDER — MORPHINE SULFATE 2 MG/ML IJ SOLN: 1.0000 mg | INTRAMUSCULAR | Status: DC | PRN

## 2013-08-16 MED ORDER — PANTOPRAZOLE SODIUM 40 MG IV SOLR
40.0000 mg | Freq: Once | INTRAVENOUS | Status: AC
  Administered 2013-08-16: 40 mg via INTRAVENOUS
  Filled 2013-08-16: qty 40

## 2013-08-16 MED ORDER — SODIUM CHLORIDE 0.9 % IV SOLN
INTRAVENOUS | Status: DC
  Administered 2013-08-16: 19:00:00 via INTRAVENOUS

## 2013-08-16 MED ORDER — SODIUM CHLORIDE 0.9 % IJ SOLN
3.0000 mL | Freq: Two times a day (BID) | INTRAMUSCULAR | Status: DC
  Administered 2013-08-16 – 2013-08-19 (×5): 3 mL via INTRAVENOUS

## 2013-08-16 NOTE — Consult Note (Addendum)
Referring Provider: No ref. provider found Primary Care Physician:  Evlyn Courier, MD Primary Gastroenterologist:  Jonette Eva  Reason for Consultation:  ANEMIA/OBSCURE GI BLEED  HPI:  PT LAST SEEN 2010 FOR WORKUP FOR A NORMOCYTIC ANEMIA/HEME POS STOOLS/RECTAL BLEEDING. EGD/TCS (D50 V3) REVEALED  Large internal hemorrhoids. 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 The largest was approximately 3 mm. The cecal AVMs easily bled when the BICAP probe passed over it. Cautery was applied (20 watts) using the 7-French BICAP probe. All lesions were ablated. Patent Schatzki's ring. No evidence of Barrett's. Multiple gastric erosions with evidence of active oozing. The lesions were in the body and the antrum. Two single gastric AVMs seen. The gastric AVMs were ablated using the 7-French BICAP probe (20 watts). ADMITTED SEP 10 WITH NSTEMI & DIARRHEA. INITIAL HB 8.4 7 DROPPED TO 7.5. SEEN AND EVALUATED BY CARDIOLOGY(LENZE).  ECHO SEP 10: SEVERE AORTIC STENOSIS, EF 55-55%, LAE. DISCHARGED WITH MEDICAL MANAGEMENT AFTER 1UpRBC AND PLAVIX LOAD/ASA 325 MG/FESO4. PT TAKES IRON QOD. RETURNED TO ED FOR DOE/SOB. HB DROPPED FROM 9.7 ON 9/15 TO 6.3 ON 9/20. ATTEMPTED TO CONTACT CARDIOLOGY(NISHAN) TO DISCUSS. PT HAD ONE BLACK FORMED BM IN ED. PT HAS TROUBLE WITH SWALLOWING PILLS. PT DENIES FEVER, CHILLS, BRBPR, nausea, vomiting, melena, constipation, abd pain,  OR heartburn or indigestion.   Past Medical History  Diagnosis Date  . Diabetes mellitus   . Hypertension   . Arthritis   . Prostate cancer   . Gout   . Coronary artery disease   . Shortness of breath   . GERD (gastroesophageal reflux disease)   . Chronic kidney disease     Past Surgical History  Procedure Laterality Date  . Prostate surgery    . Umbilical hernia repair    . Carotid endarterectomy      Prior to Admission medications   Medication Sig Start Date End Date Taking? Authorizing Provider  allopurinol (ZYLOPRIM) 100 MG tablet Take 100 mg by mouth daily.      Yes Historical Provider, MD  amLODipine (NORVASC) 5 MG tablet Take 5 mg by mouth daily.   Yes Historical Provider, MD  aspirin EC 81 MG EC tablet Take 1 tablet (81 mg total) by mouth daily. 08/12/13  Yes Erick Blinks, MD  atorvastatin (LIPITOR) 40 MG tablet Take 1 tablet (40 mg total) by mouth daily at 6 PM. 08/11/13  Yes Erick Blinks, MD  clopidogrel (PLAVIX) 75 MG tablet Take 1 tablet (75 mg total) by mouth daily with breakfast. 08/11/13  Yes Erick Blinks, MD  furosemide (LASIX) 20 MG tablet Take 1 tablet (20 mg total) by mouth daily. 08/11/13  Yes Erick Blinks, MD  hydrALAZINE (APRESOLINE) 50 MG tablet Take 1 tablet (50 mg total) by mouth 3 (three) times daily. 08/11/13  Yes Erick Blinks, MD  insulin glargine (LANTUS) 100 UNIT/ML injection Inject 0.24 mLs (24 Units total) into the skin every morning. Inject 23 units if blood sugars are below 150 or inject 28 units if blood sugars are above 150 08/11/13 08/11/14 Yes Erick Blinks, MD  metoprolol (LOPRESSOR) 50 MG tablet Take 1 tablet (50 mg total) by mouth 2 (two) times daily. 08/11/13  Yes Erick Blinks, MD    No current facility-administered medications for this encounter.   Current Outpatient Prescriptions  Medication Sig Dispense Refill  . allopurinol (ZYLOPRIM) 100 MG tablet Take 100 mg by mouth daily.        Marland Kitchen amLODipine (NORVASC) 5 MG tablet Take 5 mg by mouth daily.      Marland Kitchen  aspirin EC 81 MG EC tablet Take 1 tablet (81 mg total) by mouth daily.    Marland Kitchen atorvastatin (LIPITOR) 40 MG tablet Take 1 tablet (40 mg total) by mouth daily at 6 PM.    . clopidogrel (PLAVIX) 75 MG tablet Take 1 tablet (75 mg total) by mouth daily with breakfast.    . furosemide (LASIX) 20 MG tablet Take 1 tablet (20 mg total) by mouth daily.    . hydrALAZINE (APRESOLINE) 50 MG tablet Take 1 tablet (50 mg total) by mouth 3 (three) times daily.    . insulin glargine (LANTUS) 100 UNIT/ML injection Inject 0.24 mLs (24 Units total) into the skin every morning.  Inject 23 units if blood sugars are below 150 or inject 28 units if blood sugars are above 150    . metoprolol (LOPRESSOR) 50 MG tablet Take 1 tablet (50 mg total) by mouth 2 (two) times daily.      Allergies as of 08/16/2013  . (No Known Allergies)   Family History:  Colon Cancer  negative                           Polyps  negative  History   Social History  . Marital Status: Divorced    Spouse Name: N/A    Number of Children: N/A  . Years of Education: N/A  ACCOMPANIED BY A FAMILY FRIEND Social History Main Topics  . Smoking status: Former Games developer  . Smokeless tobacco: Not on file  . Alcohol Use: No  . Drug Use: No  . Sexual Activity: No    Review of Systems: PER HPI OTHERWISE ALL SYSTEMS ARE NEGATIVE.  Vitals: Blood pressure 133/81, pulse 80, temperature 98.3 F (36.8 C), temperature source Oral, resp. rate 18, height 5\' 5"  (1.651 m), weight 149 lb (67.586 kg), SpO2 98.00%.  Physical Exam: General:   Alert,  Well-developed, well-nourished, pleasant and cooperative in NAD Head:  Normocephalic and atraumatic. Eyes:  Sclera clear, no icterus.   Conjunctiva pink. Mouth:  No deformity or lesions, dentition normal. Neck:  Supple; no masses. Lungs:  Clear throughout to auscultation.   No wheezes. No acute distress. ABLE TO COMPLETE FULL SENTENCES Heart:  Regular rate and rhythm; SYSTOLIC murmur. Abdomen:  Soft, nontender and nondistended.Normal bowel sounds, without guarding, and without rebound.   Msk:  Symmetrical without gross deformities. Normal posture. Extremities:  Without edema. Neurologic:  Alert and  oriented x4;  grossly normal neurologically. Cervical Nodes:  No significant cervical adenopathy. Psych:  Alert and cooperative. Normal mood and affect.   Lab Results:  Recent Labs  08/16/13 1325  WBC 7.3  HGB 6.3*  HCT 19.1*  PLT 287   BMET  Recent Labs  08/16/13 1325  NA 130*  K 3.9  CL 97  CO2 22  GLUCOSE 205*  BUN 43*  CREATININE 1.80*   CALCIUM 9.6   LFT  Recent Labs  08/16/13 1325  PROT 6.8  ALBUMIN 3.1*  AST 19  ALT 12  ALKPHOS 63  BILITOT <0.1*   TROPONIN: NL   Studies/Results: CXR: SEP 20 NACPD ECG SEP 20: ST SEGMENT CHANGES SUGGESTING ISCHEMIA  Impression: 79 YO MALE WITH KNOWN HISTORY OF MULTIPLE CECAL AND TWO GASTRIC AVMs. PRESENTED 10 DAYS AGO WITH NSTEMI (TROPININ > 25), ANEMIA, AND DIARRHEA. TRANSFUSED 1u pRBCs AND STARTED ON PLAVIX/ASA/IRON. RETURNED TO ED WITH SOB ASSOCIATED WITH 3G DROP IN HB OVER A 5 DAY PERIOD. GI BLEED MOST LIKELY DUE  TO COLONIC AVMs, LESS LIKELY GASTRIC AVMS OR PUD IN LIGHT OF NO MELENA OR BRBPR.  Plan: 1. IN LIGHT OF THE PT'S AGE, CO-MORBIDITIES, RECENT NSTEMI AND LIMITED CARDIOLOGY SUPPORT AT [facility], PT SHOULD BE TRANSFERRED TO [facility] OR [facility] FOR CONTINUED CO-MANAGEMENT WITH CARDIOLOGY AND GI. PT LIKELY NEEDS A TCS AND POSSIBLY A EGD TO EVALUATE HIS ACUTE BLOOD LOSS. 2. TRANSFUSE pRBCs 3. BID PPI 4. CONSIDER RISKS V. BENEFITS OF CONTINUED ANTI-PLATELET THERAPY. WOULD FAVOR CONTINUING ASA/PLAVIX WITH CLOSE MONITORING OF HB IN LIGHT OF RECENT NSTEMI. 5. DISCUSSED WITH DR. Rito Ehrlich, PATIENT, AND FAMILY FRIEND. 6. HOLD IRON   LOS: 0 days   Danyell Shader  08/16/2013, 2:28 PM

## 2013-08-16 NOTE — ED Notes (Addendum)
Patient brought in from home. Airway patent. Alert and oriented. Patient c/o shortness of breath x2-3 days. Per patient recently released 1 week ago after having MI. Denies any chest pain. Reports intermittent swelling in right lower leg. Denies hx of COPD or CHF. Per EMS patient's O2 sat 95% on room air upon their arrival. Global ischemia on monitor per EMS.

## 2013-08-16 NOTE — ED Provider Notes (Signed)
CSN: [id number]     Arrival date & time 08/16/13  1302 History  This chart was scribed for Benny Lennert, MD by Greggory Stallion, ED Scribe. This patient was seen in room APA14/[facility]14 and the patient's care was started at 1:15 PM.   Chief Complaint  Patient presents with  . Shortness of Breath   Patient is a 79 y.o. male presenting with shortness of breath. The history is provided by the patient (pt complains of sob with ambulation). No language interpreter was used.  Shortness of Breath Severity:  Moderate Onset quality:  Sudden Timing:  Constant Progression:  Worsening Chronicity:  New   HPI Comments: [name] is a 79 y.o. male with h/o HTN and CAD who presents to the Emergency Department complaining of intermittent shortness of breath that started earlier today. He states the SOB only occurs with movement. Pt states he was discharged from [facility] 4 days ago after having a MI. Pt denies chest pain and leg swelling.   Past Medical History  Diagnosis Date  . Diabetes mellitus   . Hypertension   . Arthritis   . Prostate cancer   . Gout   . Coronary artery disease   . Shortness of breath   . GERD (gastroesophageal reflux disease)   . Chronic kidney disease    Past Surgical History  Procedure Laterality Date  . Prostate surgery    . Umbilical hernia repair    . Carotid endarterectomy     Family History  Problem Relation Age of Onset  . Cancer Mother     Deceased  . Heart failure Sister     Deceased  . Heart failure Brother     Deceased  . Hypertension Mother     Deceased  . Hypertension Father    History  Substance Use Topics  . Smoking status: Former Games developer  . Smokeless tobacco: Not on file  . Alcohol Use: No    Review of Systems  Constitutional: Negative for appetite change and fatigue.  HENT: Negative for congestion, sinus pressure and ear discharge.   Eyes: Negative for discharge.  Respiratory: Positive for shortness of breath.   Cardiovascular:  Negative for leg swelling.  Gastrointestinal: Negative for diarrhea.  Genitourinary: Negative for frequency and hematuria.  Musculoskeletal: Negative for back pain.  Neurological: Negative for seizures.  Psychiatric/Behavioral: Negative for hallucinations.    Allergies  Review of patient's allergies indicates no known allergies.  Home Medications   Current Outpatient Rx  Name  Route  Sig  Dispense  Refill  . allopurinol (ZYLOPRIM) 100 MG tablet   Oral   Take 100 mg by mouth daily.           Marland Kitchen amLODipine (NORVASC) 5 MG tablet   Oral   Take 5 mg by mouth daily.         Marland Kitchen aspirin EC 81 MG EC tablet   Oral   Take 1 tablet (81 mg total) by mouth daily.   30 tablet   0   . atorvastatin (LIPITOR) 40 MG tablet   Oral   Take 1 tablet (40 mg total) by mouth daily at 6 PM.   30 tablet   1   . clopidogrel (PLAVIX) 75 MG tablet   Oral   Take 1 tablet (75 mg total) by mouth daily with breakfast.   30 tablet   1   . furosemide (LASIX) 20 MG tablet   Oral   Take 1 tablet (20 mg total) by  mouth daily.   30 tablet   1   . hydrALAZINE (APRESOLINE) 50 MG tablet   Oral   Take 1 tablet (50 mg total) by mouth 3 (three) times daily.   90 tablet   1   . insulin glargine (LANTUS) 100 UNIT/ML injection   Subcutaneous   Inject 0.24 mLs (24 Units total) into the skin every morning. Inject 23 units if blood sugars are below 150 or inject 28 units if blood sugars are above 150   10 mL   12   . metoprolol (LOPRESSOR) 50 MG tablet   Oral   Take 1 tablet (50 mg total) by mouth 2 (two) times daily.   60 tablet   1    BP 137/53  Temp(Src) 98.3 F (36.8 C) (Oral)  Resp 18  Ht 5\' 5"  (1.651 m)  Wt 149 lb (67.586 kg)  BMI 24.79 kg/m2  SpO2 98%  Physical Exam  Constitutional: He is oriented to person, place, and time. He appears well-developed.  HENT:  Head: Normocephalic.  Eyes: Conjunctivae and EOM are normal. No scleral icterus.  Neck: Neck supple. No thyromegaly  present.  Cardiovascular: Normal rate and regular rhythm.  Exam reveals no gallop and no friction rub.   Murmur heard. 2/6 holosystolic murmur.   Pulmonary/Chest: No stridor. He has no wheezes. He has no rales. He exhibits no tenderness.  Abdominal: He exhibits no distension. There is no tenderness. There is no rebound.  Genitourinary:  Brown stool. Hem positive.   Musculoskeletal: Normal range of motion. He exhibits no edema.  Lymphadenopathy:    He has no cervical adenopathy.  Neurological: He is oriented to person, place, and time. Coordination normal.  Skin: No rash noted. No erythema.  Psychiatric: He has a normal mood and affect. His behavior is normal.    ED Course  Procedures (including critical care time)  DIAGNOSTIC STUDIES: Oxygen Saturation is 98% on RA, normal by my interpretation.    COORDINATION OF CARE: 1:20 PM-Discussed treatment plan which includes chest xray and blood work with pt at bedside and pt agreed to plan.   Labs Review Labs Reviewed  GLUCOSE, CAPILLARY - Abnormal; Notable for the following:    Glucose-Capillary 189 (*)    All other components within normal limits  CBC WITH DIFFERENTIAL  COMPREHENSIVE METABOLIC PANEL  TROPONIN I   Imaging Review Dg Chest Portable 1 View  08/16/2013   *RADIOLOGY REPORT*  Clinical Data: Shortness of breath  PORTABLE CHEST - 1 VIEW  Comparison: August 07, 2013, April 27, 2013  Findings: There is no focal infiltrate, pulmonary edema, or pleural effusion.  The mediastinal contour and cardiac silhouette are normal.  The soft tissues and osseous structures are stable.  IMPRESSION: No acute cardiopulmonary disease identified.   Original Report Authenticated By: Sherian Rein, M.D.   Date: 08/16/2013  Rate:84  Rhythm: normal sinus rhythm  QRS Axis: normal  Intervals: normal  ST/T Wave abnormalities: ST depressions inferiorly and laterally  Conduction Disutrbances:none  Narrative Interpretation:   Old EKG Reviewed:  unchanged    MDM  No diagnosis found.       The chart was scribed for me under my direct supervision.  I personally performed the history, physical, and medical decision making and all procedures in the evaluation of this patient.Benny Lennert, MD 08/16/13 1425

## 2013-08-16 NOTE — ED Notes (Signed)
CRITICAL VALUE ALERT  Critical value received:  HGB 6.3  Date of notification:  08/16/2013  Time of notification:  1358  Critical value read back:yes  Nurse who received alert:  GM  MD notified (1st page):  JZ  Time of first page:  1358  MD notified (2nd page):  Time of second page:  Responding MD:  JZ  Time MD responded:  1358

## 2013-08-16 NOTE — ED Notes (Signed)
Patient reported to another nurse that he wears 3 liters of oxygen via [place] at home all the time.

## 2013-08-16 NOTE — ED Notes (Signed)
[facility] called for transport to [facility] 

## 2013-08-16 NOTE — H&P (Signed)
[facility] History and Physical  Nuel Dejaynes ZOX:[id number] DOB: December 17, 1919 DOA: 08/16/2013  Referring physician: Valeria Batman, ER physician PCP: Evlyn Courier, MD  Specialists: Jonette Eva, gastroenterology [facility], cardiology [place] at [facility] Wendall Papa, gastroenterology [place] at [facility]   Chief Complaint: Shortness of breath  HPI: [name] is a 79 y.o. male  With past medical history of severe aortic stenosis and CAD who was just discharged from the hospital service 5 days ago after having a non-ST elevated MI and acute diastolic heart failure. Patient is already on aspirin and Plavix was added to his regimen. His hemoglobin at time of discharge was 9.7 and his creatinine was at baseline at 1.47. She does at times been doing well but for the last 24 hours has noted some increasing shortness of breath and mild chest discomfort. He has not noted any change in his stools, saying that he always has black looking stools because he is on iron. Shortness of breath was more prominent today and she came into the emergency room. He noted no fever and no cough. Upon arrival to the emergency room patient was noted to have a BUN of 43 with creatinine of 1.8. His hemoglobin was 6.3 with a normal MCV. Hemoccult was done and was noted to be heme positive. Hospitalists were called for further evaluation and admission.  Review of Systems: Patient seen in emergency room. He is doing okay. Feels very weak. Denies any headaches, vision changes, dysphasia, no chest pain or shortness of breath when sitting still. He denies any palpitations, wheezing, coughing, abdominal pain, hematuria, dysuria, constipation or diarrhea. He states that he always has black looking stools because of his iron. Denies any focal extremity numbness or weakness but overall feels quite fatigued. His review of system is otherwise negative.  Past Medical History  Diagnosis Date  . Diabetes mellitus   .  Hypertension   . Arthritis   . Prostate cancer   . Gout   . Coronary artery disease   . Shortness of breath   . GERD (gastroesophageal reflux disease)   . Chronic kidney disease    Past Surgical History  Procedure Laterality Date  . Prostate surgery    . Umbilical hernia repair    . Carotid endarterectomy     Social History:  reports that he has quit smoking. He does not have any smokeless tobacco history on file. He reports that he does not drink alcohol or use illicit drugs. Patient lives by himself, with family nearby. He is actually quite active despite his advanced age. He does not use a cane or walker. He ambulates well. He drives currently and participates in most activities of daily living without assistance  No Known Allergies  Family History  Problem Relation Age of Onset  . Cancer Mother     Deceased  . Heart failure Sister     Deceased  . Heart failure Brother     Deceased  . Hypertension Mother     Deceased  . Hypertension Father     Prior to Admission medications   Medication Sig Start Date End Date Taking? Authorizing Provider  allopurinol (ZYLOPRIM) 100 MG tablet Take 100 mg by mouth daily.     Yes Historical Provider, MD  amLODipine (NORVASC) 5 MG tablet Take 5 mg by mouth daily.   Yes Historical Provider, MD  aspirin EC 81 MG EC tablet Take 1 tablet (81 mg total) by mouth daily. 08/12/13  Yes Erick Blinks,  MD  atorvastatin (LIPITOR) 40 MG tablet Take 1 tablet (40 mg total) by mouth daily at 6 PM. 08/11/13  Yes Erick Blinks, MD  clopidogrel (PLAVIX) 75 MG tablet Take 1 tablet (75 mg total) by mouth daily with breakfast. 08/11/13  Yes Erick Blinks, MD  furosemide (LASIX) 20 MG tablet Take 1 tablet (20 mg total) by mouth daily. 08/11/13  Yes Erick Blinks, MD  hydrALAZINE (APRESOLINE) 50 MG tablet Take 1 tablet (50 mg total) by mouth 3 (three) times daily. 08/11/13  Yes Erick Blinks, MD  insulin glargine (LANTUS) 100 UNIT/ML injection Inject 0.24 mLs (24  Units total) into the skin every morning. Inject 23 units if blood sugars are below 150 or inject 28 units if blood sugars are above 150 08/11/13 08/11/14 Yes Erick Blinks, MD  metoprolol (LOPRESSOR) 50 MG tablet Take 1 tablet (50 mg total) by mouth 2 (two) times daily. 08/11/13  Yes Erick Blinks, MD   Physical Exam: Filed Vitals:   08/16/13 1546  BP: 126/75  Pulse: 79  Temp:   Resp: 18     General:  Alert and oriented x3, no acute distress, fatigued, looks younger than stated age  Eyes: Sclera nonicteric, extraocular movements are intact  ENT: Normocephalic, atraumatic, mucous membranes are slightly dry  Neck: Supple, no JVD  Cardiovascular: Regular rate and rhythm, S1-S2, soft 2/6 holosystolic murmur  Respiratory: Clear to auscultation bilaterally  Abdomen: Soft, nontender, nondistended, positive bowel sounds  Skin: No skin breaks, tears or lesions  Musculoskeletal: No clubbing or cyanosis or edema  Psychiatric: Patient is appropriate, no evidence of psychoses  Neurologic: No overt deficits  Labs on Admission:  Basic Metabolic Panel:  Recent Labs Lab 08/10/13 0439 08/11/13 0456 08/16/13 1325  NA 138 136 130*  K 4.0 3.9 3.9  CL 104 101 97  CO2 24 26 22   GLUCOSE 104* 130* 205*  BUN 21 24* 43*  CREATININE 1.48* 1.47* 1.80*  CALCIUM 9.9 10.1 9.6   Liver Function Tests:  Recent Labs Lab 08/16/13 1325  AST 19  ALT 12  ALKPHOS 63  BILITOT <0.1*  PROT 6.8  ALBUMIN 3.1*   CBC:  Recent Labs Lab 08/10/13 0439 08/11/13 0456 08/16/13 1325  WBC 6.6 5.9 7.3  NEUTROABS  --   --  5.9  HGB 9.6* 9.7* 6.3*  HCT 29.8* 30.2* 19.1*  MCV 86.1 86.0 87.2  PLT 222 228 287   Cardiac Enzymes:  Recent Labs Lab 08/16/13 1325  TROPONINI <0.30    BNP (last 3 results)  Recent Labs  08/06/13 0321  PROBNP 222.8   CBG:  Recent Labs Lab 08/10/13 2108 08/10/13 2252 08/11/13 0712 08/11/13 1136 08/16/13 1329  GLUCAP 250* 181* 126* 163* 189*     Radiological Exams on Admission: Dg Chest Portable 1 View  08/16/2013   *RADIOLOGY REPORT*  Clinical Data: Shortness of breath  PORTABLE CHEST - 1 VIEW  Comparison: August 07, 2013, April 27, 2013  Findings: There is no focal infiltrate, pulmonary edema, or pleural effusion.  The mediastinal contour and cardiac silhouette are normal.  The soft tissues and osseous structures are stable.  IMPRESSION: No acute cardiopulmonary disease identified.   Original Report Authenticated By: Sherian Rein, M.D.    EKG: Independently reviewed. ST depression in inferior and lateral leads similar to one week ago. Some presence of minimal ST depression in anterior leads which looks to be more prominent now  Assessment/Plan Principal Problem:   GI bleed: Have spoken to gastroenterology here. Patient has  history of internal hemorrhoids and cecal and gastric AV malformations. Hemoglobin dropped from 9.7-6.3 in 5 days, likely secondary to Plavix which was started at last admission. Currently stable, but with history of severe aortic stenosis, diastolic heart failure and recent MI best to have close monitoring by cardiology in CCU.  We'll start by transfusing 2 units packed red blood cells. Transfer patient to [facility] Have spoken with cardiology and gastroenterology there as well.  Active Problems:   Acute renal failure: Increasing creatinine up likely from GI bleed.    HTN (hypertension): Antihypertensives on hold secondary to bleed    Aortic stenosis-severe. Monitor strict input and output. The careful of volume overload.    CKD (chronic kidney disease) stage 3, GFR 30-59 ml/min: Increase from baseline which is normally around 1.4.    GERD (gastroesophageal reflux disease): On IV PPI.     DM (diabetes mellitus) type II uncontrolled with renal manifestation: N.p.o. plus sliding-scale.    Acute blood loss anemia: See above.  Chest discomfort: May be some early mild ischemia secondary to demand from blood  loss. Treat by replacing blood.    Chronic diastolic CHF (congestive heart failure): Currently looks to be stable. Monitor strict input and output. Holding Lasix for right now.    Iron deficiency anemia: Patient normally on iron which would account for his chronic dark stools.    History of non-ST elevation myocardial infarction (NSTEMI): Patient be on aspirin in Plavix added last week. Cardiology consulted and will see patient after arrival to hospital.   Code Status: Full code as confirmed by patient and his daughter.  Family Communication: Plan discussed with patient's daughter at the bedside.  Disposition Plan: Transfer to [place]: Step down unit  Time spent: 45 minutes  Hollice Espy [facility] Pager [phone]  If 7PM-7AM, please contact night-coverage [email or web address] Password [facility] 08/16/2013, 3:56 PM

## 2013-08-17 ENCOUNTER — Encounter (HOSPITAL_COMMUNITY): Payer: Self-pay | Admitting: *Deleted

## 2013-08-17 DIAGNOSIS — D649 Anemia, unspecified: Secondary | ICD-10-CM

## 2013-08-17 DIAGNOSIS — R9431 Abnormal electrocardiogram [ECG] [EKG]: Secondary | ICD-10-CM

## 2013-08-17 DIAGNOSIS — I359 Nonrheumatic aortic valve disorder, unspecified: Secondary | ICD-10-CM

## 2013-08-17 LAB — CBC
HCT: 24.3 % — ABNORMAL LOW (ref 39.0–52.0)
HCT: 24.3 % — ABNORMAL LOW (ref 39.0–52.0)
Hemoglobin: 8.2 g/dL — ABNORMAL LOW (ref 13.0–17.0)
MCH: 29 pg (ref 26.0–34.0)
Platelets: 257 10*3/uL (ref 150–400)
RBC: 2.83 MIL/uL — ABNORMAL LOW (ref 4.22–5.81)
RDW: 15.1 % (ref 11.5–15.5)
WBC: 7.1 10*3/uL (ref 4.0–10.5)
WBC: 6.7 10*3/uL (ref 4.0–10.5)

## 2013-08-17 LAB — TYPE AND SCREEN
Antibody Screen: NEGATIVE
Unit division: 0

## 2013-08-17 LAB — PREPARE RBC (CROSSMATCH)

## 2013-08-17 LAB — BASIC METABOLIC PANEL
BUN: 34 mg/dL — ABNORMAL HIGH (ref 6–23)
Calcium: 9.3 mg/dL (ref 8.4–10.5)
Chloride: 105 mEq/L (ref 96–112)
Creatinine, Ser: 1.47 mg/dL — ABNORMAL HIGH (ref 0.50–1.35)
GFR calc Af Amer: 46 mL/min — ABNORMAL LOW (ref >90)
GFR calc non Af Amer: 40 mL/min — ABNORMAL LOW (ref >90)
Sodium: 137 mEq/L (ref 135–145)

## 2013-08-17 LAB — TROPONIN I: Troponin I: 0.57 ng/mL (ref <0.30)

## 2013-08-17 LAB — GLUCOSE, CAPILLARY
Glucose-Capillary: 91 mg/dL (ref 70–99)
Glucose-Capillary: 91 mg/dL (ref 70–99)
Glucose-Capillary: 112 mg/dL — ABNORMAL HIGH (ref 70–99)

## 2013-08-17 MED ORDER — INFLUENZA VAC SPLIT QUAD 0.5 ML IM SUSP
0.5000 mL | INTRAMUSCULAR | Status: AC
  Filled 2013-08-17: qty 0.5

## 2013-08-17 MED ORDER — PANTOPRAZOLE SODIUM 40 MG IV SOLR
40.0000 mg | Freq: Two times a day (BID) | INTRAVENOUS | Status: DC
  Filled 2013-08-17 (×2): qty 40

## 2013-08-17 MED ORDER — METOPROLOL TARTRATE 12.5 MG HALF TABLET
12.5000 mg | ORAL_TABLET | Freq: Two times a day (BID) | ORAL | Status: DC
  Administered 2013-08-17 (×2): 12.5 mg via ORAL
  Filled 2013-08-17 (×4): qty 1

## 2013-08-17 MED ORDER — INSULIN ASPART 100 UNIT/ML ~~LOC~~ SOLN
0.0000 [IU] | Freq: Three times a day (TID) | SUBCUTANEOUS | Status: DC
  Administered 2013-08-17: 2 [IU] via SUBCUTANEOUS
  Administered 2013-08-18 – 2013-08-19 (×3): 1 [IU] via SUBCUTANEOUS

## 2013-08-17 MED ORDER — ATORVASTATIN CALCIUM 40 MG PO TABS
40.0000 mg | ORAL_TABLET | Freq: Every day | ORAL | Status: DC
  Administered 2013-08-17 – 2013-08-20 (×4): 40 mg via ORAL
  Filled 2013-08-17 (×5): qty 1

## 2013-08-17 MED ORDER — DEXTROSE-NACL 5-0.9 % IV SOLN
INTRAVENOUS | Status: DC
  Administered 2013-08-17: 500 mL via INTRAVENOUS

## 2013-08-17 MED ORDER — POTASSIUM CHLORIDE CRYS ER 20 MEQ PO TBCR
40.0000 meq | EXTENDED_RELEASE_TABLET | Freq: Once | ORAL | Status: AC
  Administered 2013-08-17: 40 meq via ORAL
  Filled 2013-08-17: qty 2

## 2013-08-17 MED ORDER — PANTOPRAZOLE SODIUM 40 MG IV SOLR
40.0000 mg | Freq: Two times a day (BID) | INTRAVENOUS | Status: DC
  Administered 2013-08-17 – 2013-08-20 (×6): 40 mg via INTRAVENOUS
  Filled 2013-08-17 (×7): qty 40

## 2013-08-17 NOTE — Progress Notes (Signed)
CRITICAL VALUE ALERT  Critical value received:  Trop= 0.57  Date of notification:  08/17/2013  Time of notification:  0935  Critical value read back:yes  Nurse who received alert:  KIM/Melah Ebling, RN   MD notified (1st page):  Brion Aliment, PA/NP   Time of first page:  [phone]  MD notified (2nd page):  Time of second page:  Responding MD:  Brion Aliment, PA/NP   Time MD responded:  [phone]  Note: no new order received

## 2013-08-17 NOTE — Progress Notes (Signed)
[facility] Progress Note [place] TEAM 1 - Stepdown/ICU TEAM   Tamala Ser ZOX:[id number] DOB: Mar 02, 1920 DOA: 08/16/2013 PCP: Evlyn Courier, MD  Admit HPI / Brief Narrative: 79 y.o. male with past medical history of severe aortic stenosis and CAD who was discharged from the hospital 5 days prior to this admit after having a non-ST elevation MI with acute diastolic heart failure. Patient was already on aspirin therefore Plavix was added to his regimen at that time. His hemoglobin at time of discharge was 9.7 and his creatinine was at baseline (1.47).  For the 24 hours prior to readmit he noted increasing shortness of breath and mild chest discomfort. He had not noted any change in his stools, saying that he always has black looking stools because he is on iron. Shortness of breath became more sever and he therefore came to the emergency room. He noted no fever or cough.   Upon arrival to the emergency room patient was noted to have a BUN of 43 with creatinine of 1.8. His hemoglobin was 6.3 with a normal MCV. Hemoccult was done and was noted to be heme positive. Hospitalists were called for further evaluation, and the decision was ultimatley made to transfer the pt to [facility] for proximity to cardiac services.  Assessment/Plan:  GIB GI following - Plavix discontinued - will probably require EGD   Acute blood loss anemia  Hemoglobin nadir of 6.3 - status post 2 units packed red blood cells with appropriate response - recent baseline hemoglobin 9.7 - cont to follow trend   Recent NSTEMI Troponin elevated - Cardiology following  Severe AoS Well compensated at this time - care w/ fluid management   DM CBGs well-controlled at this time  HTN Well-controlled at the present time  Gout Quiescent  Acute renal insult on CKD with baseline crt 1.47 (stage 3, GFR 30-59 ml/min) Creatinine has returned to baseline - acute insult likely due to prerenal state  Chronic diastolic CHF  Strict  Is/Os - daily weights - well compensated at this time  Mild hypokalemia Likely due to poor intake in setting of diuretic therapy - replace and follow  Code Status: FULL Family Communication: no family present at time of exam Disposition Plan: SDU  Consultants: Cardiology GI  Procedures: none  Antibiotics: none  DVT prophylaxis: SCDs  HPI/Subjective: The patient is alert oriented and conversant.  He denies specific complaints at this time other than being hungry.  He specifically denies chest pain shortness of breath or abdominal pain.  He does report that he has had a black tarry stool since his admission.  Objective: Blood pressure 113/48, pulse 76, temperature 98 F (36.7 C), temperature source Oral, resp. rate 16, height 5\' 5"  (1.651 m), weight 65.7 kg (144 lb 13.5 oz), SpO2 97.00%.  Intake/Output Summary (Last 24 hours) at 08/17/13 1013 Last data filed at 08/17/13 0814  Gross per 24 hour  Intake 471.33 ml  Output   1175 ml  Net -703.67 ml    Exam: General: No acute respiratory distress Lungs: Clear to auscultation bilaterally without wheezes or crackles Cardiovascular: Regular rate and rhythm without gallop or rub n- 2/6 holosystolic M Abdomen: Nontender, nondistended, soft, bowel sounds positive, no rebound, no ascites, no appreciable mass Extremities: No significant cyanosis, clubbing, or edema bilateral lower extremities  Data Reviewed: Basic Metabolic Panel:  Recent Labs Lab 08/11/13 0456 08/16/13 1325 08/17/13 0800  NA 136 130* 137  K 3.9 3.9 3.4*  CL 101 97 105  CO2 26  22 20  GLUCOSE 130* 205* 91  BUN 24* 43* 34*  CREATININE 1.47* 1.80* 1.47*  CALCIUM 10.1 9.6 9.3   Liver Function Tests:  Recent Labs Lab 08/16/13 1325  AST 19  ALT 12  ALKPHOS 63  BILITOT <0.1*  PROT 6.8  ALBUMIN 3.1*   CBC:  Recent Labs Lab 08/11/13 0456 08/16/13 1325 08/16/13 2200 08/17/13 0800  WBC 5.9 7.3  --  7.1  NEUTROABS  --  5.9  --   --   HGB 9.7*  6.3* 7.3* 8.3*  HCT 30.2* 19.1* 21.5* 24.3*  MCV 86.0 87.2  --  85.3  PLT 228 287  --  257   Cardiac Enzymes:  Recent Labs Lab 08/16/13 1325 08/17/13 0800  TROPONINI <0.30 0.57*   BNP (last 3 results)  Recent Labs  08/06/13 0321  PROBNP 222.8   CBG:  Recent Labs Lab 08/16/13 1957 08/16/13 2350 08/17/13 0155 08/17/13 0357 08/17/13 0810  GLUCAP 152* 99 89 91 91     Studies:  Recent x-ray studies have been reviewed in detail by the Attending Physician  Scheduled Meds:  Scheduled Meds: . aspirin EC  81 mg Oral Daily  . [START ON 08/18/2013] influenza vac split quadrivalent PF  0.5 mL Intramuscular Tomorrow-1000  . insulin aspart  0-9 Units Subcutaneous Q4H  . pantoprazole (PROTONIX) IV  40 mg Intravenous Q12H  . sodium chloride  3 mL Intravenous Q12H    Time spent on care of this patient: 35 mins   [facility] T  [facility] Office  [phone] Pager - Text Page per Loretha Stapler as per below:  On-Call/Text Page:      [email or web address]      password [email or web address]  If 7PM-7AM, please contact night-coverage [email or web address] Password [email or web address] 08/17/2013, 10:13 AM   LOS: 1 day

## 2013-08-17 NOTE — Consult Note (Signed)
CARDIOLOGY CONSULT NOTE  Patient ID: [name] MRN: [id number], DOB/AGE: Sep 06, 1920   Admit date: 08/16/2013 Date of Consult: 08/17/2013  Primary Physician: Evlyn Courier, MD Primary Cardiologist: Junius Argyle, MD   Pt. Profile  79 y/o male with h/o recent medically managed NSTEMI in setting of anemia/CKD, who presents on tx from [facility] 2/2 GIB/worsening anemia, whom we have been asked to eval related asa/plavix Rx.  Problem List  Past Medical History  Diagnosis Date  . Diabetes mellitus   . Hypertension   . Arthritis   . Prostate cancer   . Gout   . Coronary artery disease     a. presumed, s/p NSTEMI 07/2013-->Med Rx  . GERD (gastroesophageal reflux disease)   . CKD (chronic kidney disease), stage III   . Iron deficiency anemia     a. 07/2013 s/p multiple PRBC's.  . Heme positive stool   . Moderate to severe aortic stenosis     a.  07/2013 Echo: EF50-55%, no rwma, mod-sev AS (0.78cm^2 VTI, 0.73cm^2 Vmax), mild AI, Mild MR, mod dil LA, PASP    Past Surgical History  Procedure Laterality Date  . Prostate surgery    . Umbilical hernia repair    . Carotid endarterectomy      Allergies  No Known Allergies  HPI   79 y/o male with the above complex problem list.  He was admitted to [facility] earlier this month with anemia, heme + stool, and CHF with markedly elevated troponin.  EF was normal, however he was found to have moderate to severe AS.  He did require blood transfusion (one unit of PRBC's) during hospitalization.  In light of his co-morbidities, he was medically managed from a cardiac standpoint and he was placed on ASA and plavix therapy.  He was d/c'd home on 9/15 with a plan for outpt GI and Cardiology f/u.  Unfortunately, on 9/19, he began to experience recurrent dyspnea.  He says that he has had not had any chest discomfort.  He has persistent dark stools, which he attributes to long term iron therapy.  He presented to the [facility] ED on 9/20 and was found to be  severely anemic with an H/H of 6.3/19.1.  Troponin was nl.  CXR was non-acute.  ECG showed diffuse ST dep - slightly more pronounced than on prior ECG.  Stool was heme +.  He was transfused 2 units of prbc's and seen by GI, who recommended BID PPI therapy and transfer to [facility] for further GI and Cardiology support.  This AM, he is comfortable.  Breathing better.  No chest pain.  Inpatient Medications  . aspirin EC  81 mg Oral Daily  . [START ON 08/18/2013] influenza vac split quadrivalent PF  0.5 mL Intramuscular Tomorrow-1000  . insulin aspart  0-9 Units Subcutaneous Q4H  . pantoprazole (PROTONIX) IV  40 mg Intravenous Q12H  . sodium chloride  3 mL Intravenous Q12H    Family History Family History  Problem Relation Age of Onset  . Cancer Mother     Deceased  . Heart failure Sister     Deceased  . Heart failure Brother     Deceased  . Hypertension Mother     Deceased  . Hypertension Father      Social History History   Social History  . Marital Status: Divorced    Spouse Name: N/A    Number of Children: N/A  . Years of Education: N/A   Occupational History  . Not on  file.   Social History Main Topics  . Smoking status: Former Games developer  . Smokeless tobacco: Not on file  . Alcohol Use: No  . Drug Use: No  . Sexual Activity: No   Other Topics Concern  . Not on file   Social History Narrative   Lives in [place] by himself.  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     Review of Systems  General:  No chills, fever, night sweats or weight changes.  Cardiovascular:  No chest pain, +++ dyspnea on exertion, no edema, orthopnea, palpitations, paroxysmal nocturnal dyspnea. Dermatological: No rash, lesions/masses Respiratory: No cough, +++ dyspnea Urologic: No hematuria, dysuria Abdominal:   +++ dark stools.  No nausea, vomiting, diarrhea, bright red blood per rectum, or hematemesis Neurologic:  No visual changes, wkns, changes in mental  status. All other systems reviewed and are otherwise negative except as noted above.  Physical Exam  Blood pressure 150/54, pulse 78, temperature 98 F (36.7 C), temperature source Oral, resp. rate 23, height 5\' 5"  (1.651 m), weight 144 lb 13.5 oz (65.7 kg), SpO2 94.00%.  General: Pleasant, NAD Psych: Normal affect. Neuro: Alert and oriented X 3. Moves all extremities spontaneously. HEENT: Normal  Neck: Supple without JVD.  Radiated murmur noted bilat. Lungs:  Resp regular and unlabored, CTA. Heart: RRR no s3, s4, 3/6 SEM RUSB to neck and heard throughout precordium. Abdomen: Soft, non-tender, non-distended, BS + x 4.  Extremities: No clubbing, cyanosis or edema. DP/PT/Radials 2+ and equal bilaterally.  Labs   Recent Labs  08/16/13 1325  TROPONINI <0.30   Lab Results  Component Value Date   WBC 7.1 08/17/2013   HGB 8.3* 08/17/2013   HCT 24.3* 08/17/2013   MCV 85.3 08/17/2013   PLT 257 08/17/2013     Recent Labs Lab 08/16/13 1325 08/17/13 0800  NA 130* 137  K 3.9 3.4*  CL 97 105  CO2 22 20  BUN 43* 34*  CREATININE 1.80* 1.47*  CALCIUM 9.6 9.3  PROT 6.8  --   BILITOT <0.1*  --   ALKPHOS 63  --   ALT 12  --   AST 19  --   GLUCOSE 205* 91   Radiology/Studies  Dg Chest Portable 1 View  08/16/2013   *RADIOLOGY REPORT*  Clinical Data: Shortness of breath  PORTABLE CHEST - 1 VIEW IMPRESSION: No acute cardiopulmonary disease identified.   Original Report Authenticated By: Sherian Rein, M.D.   ECG  Rsr, 84, 1st deg avb, inflat/ant st dep - slightly more pronounced than on Aug 12, 2023 ecg.  ASSESSMENT AND PLAN  1.  Acute on chronic GI blood loss and iron def anemia:  Per IM/GI.  H/H improved after transfusion.  D/c plavix given that he was not recently stented and risks of further therapy outweigh benefits at this point.  Cont low-dose ASA if okay with GI.  2.  H/O NSTEMI:  Presented with dyspnea yesterday in setting of #1.  ECG abnl with sl more pronounced ST dep than on  prior tracing on Aug 12, 2023.  Currently pain free.  Initial troponin is nl.  Cont to follow.  As above, d/c plavix.  Would prefer to continue low-dose asa.  Resume home doses of statin, bb as he stabilizes.  BP currently stable.  3.  Mod-Sev AS:  Conservative mgmt.  Signed, Nicolasa Ducking, NP 08/17/2013, 9:12 AM   Cardiology Attending  Patient seen and examined. Agree with above history, physical exam, assessment and plan as

## 2013-08-17 NOTE — Consult Note (Signed)
Referring Provider: TriadSharon Hampton Primary Care Physician:  Duane Courier, MD Primary Gastroenterologist:  Dr. Jonette Hampton  I am covering this patient today for Drs. [name].   Reason for Consultation:  Gi bleeding  HPI: [name] is a 79 y.o. male who was transferred from [facility] yesterday after being admitted there with complaints of weakness and shortness of breath as well as chest discomfort. He has history of coronary artery disease and is status post a very recent MI. He had just been discharged from the hospital 5 days prior to readmission. He also has severe aortic stenosis and known history of colonic and gastric AVMs and chronic iron deficiency anemia as well as diabetes.. Patient had been on chronic aspirin and had Plavix added to his regimen with his recent MI. His hemoglobin was 9.7 on discharge from the hospital and when he presented to the emergency room yesterday hemoglobin was 6.3, BUN was elevated at 43 creatinine 1.8. He is documented Hemoccult positive.  He was seen in consultation by Dr. Jonette Hampton, who has seen him previously and did  EGD and colonoscopy in 2010. Colonoscopy showed that he had large internal hemorrhoids and cecal AVMs on .Marland Kitchen The cecal AVMs were ablated. He also had EGD in 2010 with multiple gastric erosions and 2 gastric AVMs which were also ablated.  He has been maintained on chronic iron which patient says he stopped since he started "all these new medicines". He says his stool is been black over the past few days but he did not perceive that is being any different from the norm for him. He has no complaints of abdominal pain, diarrhea nausea or vomiting. He feels better today and denies any shortness of breath. He has been transfused 2 units of packed RBCs and hemoglobin this morning is 8.3. He has also been seen by cardiology and consultation and decision was made to stop his Plavix as he did not have any intervention done with a recent non-ST MI  he will be continued on aspirin.   Past Medical History  Diagnosis Date  . Diabetes mellitus   . Hypertension   . Arthritis   . Prostate cancer   . Gout   . Coronary artery disease     a. presumed, s/p NSTEMI 07/2013-->Med Rx  . GERD (gastroesophageal reflux disease)   . CKD (chronic kidney disease), stage III   . Iron deficiency anemia     a. 07/2013 s/p multiple PRBC's.  . Heme positive stool   . Moderate to severe aortic stenosis     a.  07/2013 Echo: EF50-55%, no rwma, mod-sev AS (0.78cm^2 VTI, 0.73cm^2 Vmax), mild AI, Mild MR, mod dil LA, PASP    Past Surgical History  Procedure Laterality Date  . Prostate surgery    . Umbilical hernia repair    . Carotid endarterectomy      Prior to Admission medications   Medication Sig Start Date End Date Taking? Authorizing Provider  allopurinol (ZYLOPRIM) 100 MG tablet Take 100 mg by mouth daily.     Yes Historical Provider, MD  amLODipine (NORVASC) 5 MG tablet Take 5 mg by mouth daily.   Yes Historical Provider, MD  aspirin EC 81 MG EC tablet Take 1 tablet (81 mg total) by mouth daily. 08/12/13  Yes Erick Blinks, MD  atorvastatin (LIPITOR) 40 MG tablet Take 1 tablet (40 mg total) by mouth daily at 6 PM. 08/11/13  Yes Erick Blinks, MD  clopidogrel (PLAVIX) 75 MG tablet  Take 1 tablet (75 mg total) by mouth daily with breakfast. 08/11/13  Yes Erick Blinks, MD  furosemide (LASIX) 20 MG tablet Take 1 tablet (20 mg total) by mouth daily. 08/11/13  Yes Erick Blinks, MD  hydrALAZINE (APRESOLINE) 50 MG tablet Take 1 tablet (50 mg total) by mouth 3 (three) times daily. 08/11/13  Yes Erick Blinks, MD  insulin glargine (LANTUS) 100 UNIT/ML injection Inject 0.24 mLs (24 Units total) into the skin every morning. Inject 23 units if blood sugars are below 150 or inject 28 units if blood sugars are above 150 08/11/13 08/11/14 Yes Erick Blinks, MD  metoprolol (LOPRESSOR) 50 MG tablet Take 1 tablet (50 mg total) by mouth 2 (two) times daily.  08/11/13  Yes Erick Blinks, MD    Current Facility-Administered Medications  Medication Dose Route Frequency Provider Last Rate Last Dose  . aspirin EC tablet 81 mg  81 mg Oral Daily Hollice Espy, MD   81 mg at 08/17/13 1147  . atorvastatin (LIPITOR) tablet 40 mg  40 mg Oral q1800 Lonia Blood, MD      . dextrose 5 %-0.9 % sodium chloride infusion   Intravenous Continuous Rolan Lipa, NP 20 mL/hr at 08/17/13 0232 500 mL at 08/17/13 0232  . [START ON 08/18/2013] influenza vac split quadrivalent PF (FLUARIX) injection 0.5 mL  0.5 mL Intramuscular Tomorrow-1000 Lonia Blood, MD      . insulin aspart (novoLOG) injection 0-9 Units  0-9 Units Subcutaneous TID WC Lonia Blood, MD      . metoprolol tartrate (LOPRESSOR) tablet 12.5 mg  12.5 mg Oral BID Lonia Blood, MD   12.5 mg at 08/17/13 1147  . morphine 2 MG/ML injection 1 mg  1 mg Intravenous Q4H PRN Hollice Espy, MD      . ondansetron [facility]) tablet 4 mg  4 mg Oral Q6H PRN Hollice Espy, MD       Or  . ondansetron (ZOFRAN) injection 4 mg  4 mg Intravenous Q6H PRN Hollice Espy, MD      . pantoprazole (PROTONIX) injection 40 mg  40 mg Intravenous Q12H Lonia Blood, MD   40 mg at 08/17/13 0535  . sodium chloride 0.9 % injection 3 mL  3 mL Intravenous Q12H Hollice Espy, MD   3 mL at 08/16/13 2201    Allergies as of 08/16/2013  . (No Known Allergies)    Family History  Problem Relation Age of Onset  . Cancer Mother     Deceased  . Heart failure Sister     Deceased  . Heart failure Brother     Deceased  . Hypertension Mother     Deceased  . Hypertension Father     History   Social History  . Marital Status: Divorced    Spouse Name: N/A    Number of Children: N/A  . Years of Education: N/A   Occupational History  . Not on file.   Social History Main Topics  . Smoking status: Former Games developer  . Smokeless tobacco: Not on file  . Alcohol Use: No  . Drug Use: No  .  Sexual Activity: No   Other Topics Concern  . Not on file   Social History Narrative   Lives in [place] by himself.  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    Review of Systems: Pertinent positive and negative review of systems were noted in the above HPI  section.  All other review of systems was otherwise negative.  Physical Exam: Vital signs in last 24 hours: Temp:  [97.6 F (36.4 C)-98.3 F (36.8 C)] 98.1 F (36.7 C) (09/21 1100) Pulse Rate:  [18-80] 67 (09/21 1100) Resp:  [6-23] 13 (09/21 1100) BP: (113-165)/(43-81) 162/54 mmHg (09/21 1100) SpO2:  [94 %-100 %] 96 % (09/21 1100) Weight:  [144 lb 13.5 oz (65.7 kg)-149 lb (67.586 kg)] 144 lb 13.5 oz (65.7 kg) (09/20 1830) Last BM Date: 08/15/13 General:   Alert,  Well-developed,frail ,thin elderly AA male , pleasant and cooperative in NAD Head:  Normocephalic and atraumatic. Eyes:  Sclera clear, no icterus.   Conjunctiva pale Ears:  Normal auditory acuity. Nose:  No deformity, discharge,  or lesions. Mouth:  No deformity or lesions.   Neck:  Supple; no masses or thyromegaly. Lungs:  Clear throughout to auscultation.   No wheezes, crackles, or rhonchi.  Heart:  Regular rate and rhythm; squeaky syst murmur Abdomen:  Soft,nontender, BS active,nonpalp mass or hsm.   Rectal:  Deferred  Msk:  Symmetrical without gross deformities. . Pulses:  Normal pulses noted. Extremities:  Without clubbing or edema. Neurologic:  Alert and  oriented x4;  grossly normal neurologically. Skin:  Intact without significant lesions or rashes.. Psych:  Alert and cooperative. Normal mood and affect.  Lab Results:  Recent Labs  08/16/13 1325 08/16/13 2200 08/17/13 0800  WBC 7.3  --  7.1  HGB 6.3* 7.3* 8.3*  HCT 19.1* 21.5* 24.3*  PLT 287  --  257   BMET  Recent Labs  08/16/13 1325 08/17/13 0800  NA 130* 137  K 3.9 3.4*  CL 97 105  CO2 22 20  GLUCOSE 205* 91  BUN 43* 34*  CREATININE 1.80* 1.47*   CALCIUM 9.6 9.3   LFT  Recent Labs  08/16/13 1325  PROT 6.8  ALBUMIN 3.1*  AST 19  ALT 12  ALKPHOS 63  BILITOT <0.1*        Studies/Results: Dg Chest Portable 1 View  08/16/2013   *RADIOLOGY REPORT*  Clinical Data: Shortness of breath  PORTABLE CHEST - 1 VIEW  Comparison: August 07, 2013, April 27, 2013  Findings: There is no focal infiltrate, pulmonary edema, or pleural effusion.  The mediastinal contour and cardiac silhouette are normal.  The soft tissues and osseous structures are stable.  IMPRESSION: No acute cardiopulmonary disease identified.   Original Report Authenticated By: Sherian Rein, M.D.    IMPRESSION:  #58 79 year old male with a very recent non-ST MI approximately 10 days ago. Patient was treated medically and started on Plavix in addition to aspirin. #2 two-day history of progressive shortness of breath and weakness-very likely secondary to GI bleed and progressive anemia. #3 chronic iron deficiency anemia #4 acute/subacute GI bleed with almost 4 g drop in hemoglobin over the past 4-5 days-likely secondary to bleeding from previously documented AVMs in the setting of aspirin and Plavix #5 previously documented gastric and cecal AVMs with ablation 2010. Patient is currently stable without evidence of active bleeding #6 severe aortic stenosis #7 congestive heart failure #8 renal insufficiency #9 diabetes mellitus  PLAN: Serial hemoglobins and keep his hemoglobin above 8 especially given age and recent MI Empiric PPI Agree with stopping Plavix indefinitely and continuing aspirin Patient needs EGD with probable [facility]. This will be scheduled for Monday with Drs. Mann/Hung. Patient just had Plavix stopped yesterday, which will take 4-5 days to wash out and could also argue waiting on EGD so that  ablation may be more effective. Have put orders in for tomorrow but will defer to Dr. Elnoria Howard regarding timing  Will start full liquid diet today and n.p.o. after midnight.  Procedure was discussed in detail with the patient and he is agreeable to proceed. He is known to Dr. Raj Janus, and will need to continue follow up with her on an outpatient basis    Amy Gundersen [facility]  08/17/2013, 12:51 PM   ________________________________________________________________________  Corinda Gubler GI MD note:  I personally examined the patient, reviewed the data and agree with the assessment and plan described above.  Given the timing of his bleeding, Plavix must be playing a significant role since it was just started very recently. Cardiology consult today recommended stopping the plavix since he had no intervention recently and I think that is probably the best way to reduce his chances of further significant GI bleeding.  He thinks his last plavix was 2 days ago, usually takes 5 days to wash out.  He responded very well to 2 unit blood transfusion, no signs of persistent bleeding currently (HR70, BP 150s, last BM >24 hours ago).  Will keep him NPO after MN tonight for possible EGD. It is not unreasonable, especially given his recent AMI, to give him another 1-2 days prior to invasive testing which will allow more plavix washout time.  Dr. Nicholes Hampton to assume care tomorrow.  Rob Bunting, MD [facility] Pager [phone]

## 2013-08-17 NOTE — Progress Notes (Addendum)
[facility] hospitalist progress note. Chief complaint. Transfer note. History of present illness. This 79 year old male presented to [facility] with complaints of shortness of breath. Patient's hemoglobin noted low 6.3 and Hemoccult was positive. It was elected to transfer the patient to [facility] for a cardiology and gastroenterology consults. I have now been informed that the patient has arrived seeing the patient to ensure he remained stable post transfer and his orders transferred appropriately. I find the patient is alert and without any current complaints. Physical exam. Vital signs. Temperature 97.9, pulse 73, respiration 16, blood pressure 153/48. O2 sats 100%. General appearance. Frail appearing elderly male who is alert, cooperative, and in no distress. Cardiac. Rate and rhythm regular with 3/6 systolic ejection murmur. Lungs. Breath sounds clear and equal. Abdomen. Soft with positive bowel sounds. No pain with palpation. Impression/plan Problem #1. Chest discomfort. The evening ischemia due to blood loss. Patient now post 2 units of packed red blood cells. Cardiology has been consulted and will follow patient was transferred to [facility]. Problem #2. Acute blood loss anemia. Patient currently appears clinically stable. No further bleeding noted since arrival to [facility]. I did contact [facility] gastroenterology and notify him of patient's arrival. He did request I increase Protonix to 40 mg twice a day IV which was done. He will see the patient later this a.m. Clinically the patient appears stable post transfer from [facility] and all orders transferred appropriately.

## 2013-08-18 ENCOUNTER — Encounter (HOSPITAL_COMMUNITY)
Admission: EM | Disposition: A | Discharge status: Home / Self Care | Payer: Self-pay | Source: Home / Self Care | Attending: Internal Medicine

## 2013-08-18 DIAGNOSIS — I214 Non-ST elevation (NSTEMI) myocardial infarction: Secondary | ICD-10-CM

## 2013-08-18 LAB — TYPE AND SCREEN: Unit division: 0

## 2013-08-18 LAB — BASIC METABOLIC PANEL
BUN: 27 mg/dL — ABNORMAL HIGH (ref 6–23)
CO2: 22 mEq/L (ref 19–32)
Chloride: 110 mEq/L (ref 96–112)
Creatinine, Ser: 1.36 mg/dL — ABNORMAL HIGH (ref 0.50–1.35)
GFR calc Af Amer: 51 mL/min — ABNORMAL LOW (ref >90)
GFR calc non Af Amer: 44 mL/min — ABNORMAL LOW (ref >90)
Glucose, Bld: 125 mg/dL — ABNORMAL HIGH (ref 70–99)
Potassium: 4.1 mEq/L (ref 3.5–5.1)

## 2013-08-18 LAB — GLUCOSE, CAPILLARY
Glucose-Capillary: 197 mg/dL — ABNORMAL HIGH (ref 70–99)
Glucose-Capillary: 155 mg/dL — ABNORMAL HIGH (ref 70–99)
Glucose-Capillary: 144 mg/dL — ABNORMAL HIGH (ref 70–99)
Glucose-Capillary: 131 mg/dL — ABNORMAL HIGH (ref 70–99)

## 2013-08-18 LAB — CBC
HCT: 24.1 % — ABNORMAL LOW (ref 39.0–52.0)
Hemoglobin: 8.2 g/dL — ABNORMAL LOW (ref 13.0–17.0)
MCH: 29.3 pg (ref 26.0–34.0)
MCV: 86.1 fL (ref 78.0–100.0)
RBC: 2.8 MIL/uL — ABNORMAL LOW (ref 4.22–5.81)
RDW: 15.2 % (ref 11.5–15.5)
WBC: 7.5 10*3/uL (ref 4.0–10.5)

## 2013-08-18 SURGERY — EGD (ESOPHAGOGASTRODUODENOSCOPY)
Anesthesia: Moderate Sedation

## 2013-08-18 MED ORDER — METOPROLOL TARTRATE 25 MG PO TABS
25.0000 mg | ORAL_TABLET | Freq: Two times a day (BID) | ORAL | Status: DC
  Administered 2013-08-18 – 2013-08-21 (×7): 25 mg via ORAL
  Filled 2013-08-18 (×8): qty 1

## 2013-08-18 MED ORDER — HYDRALAZINE HCL 50 MG PO TABS
50.0000 mg | ORAL_TABLET | Freq: Three times a day (TID) | ORAL | Status: DC
  Administered 2013-08-18 – 2013-08-21 (×8): 50 mg via ORAL
  Filled 2013-08-18 (×11): qty 1

## 2013-08-18 MED ORDER — SODIUM CHLORIDE 0.9 % IV SOLN
INTRAVENOUS | Status: DC
  Administered 2013-08-18 – 2013-08-21 (×4): via INTRAVENOUS

## 2013-08-18 NOTE — Progress Notes (Signed)
Patient Name: [name] Date of Encounter: 08/18/2013   Principal Problem:   GI bleed Active Problems:   NSTEMI (non-ST elevated myocardial infarction)   Acute renal failure   HTN (hypertension)   Aortic stenosis   CKD (chronic kidney disease) stage 3, GFR 30-59 ml/min   GERD (gastroesophageal reflux disease)   Iron deficiency   DM (diabetes mellitus) type II uncontrolled with renal manifestation   Acute blood loss anemia   Chronic diastolic CHF (congestive heart failure)   Iron deficiency anemia   Elevated troponin   SUBJECTIVE  No chest pain or sob.  Trop elevated to 0.57.  CURRENT MEDS . aspirin EC  81 mg Oral Daily  . atorvastatin  40 mg Oral q1800  . influenza vac split quadrivalent PF  0.5 mL Intramuscular Tomorrow-1000  . insulin aspart  0-9 Units Subcutaneous TID WC  . metoprolol  12.5 mg Oral BID  . pantoprazole (PROTONIX) IV  40 mg Intravenous Q12H  . sodium chloride  3 mL Intravenous Q12H    OBJECTIVE  Filed Vitals:   08/17/13 2009 08/17/13 2256 08/18/13 0014 08/18/13 0421  BP: 172/99 154/39 140/59 155/50  Pulse: 78 90 80 71  Temp: 98.2 F (36.8 C)  98.9 F (37.2 C) 98.5 F (36.9 C)  TempSrc: Oral  Oral Oral  Resp: 16  24 17   Height:      Weight:    146 lb 2.6 oz (66.3 kg)  SpO2: 97%  95% 97%    Intake/Output Summary (Last 24 hours) at 08/18/13 0713 Last data filed at 08/18/13 0430  Gross per 24 hour  Intake      3 ml  Output   1300 ml  Net  -1297 ml   Filed Weights   08/16/13 1305 08/16/13 1830 08/18/13 0421  Weight: 149 lb (67.586 kg) 144 lb 13.5 oz (65.7 kg) 146 lb 2.6 oz (66.3 kg)    PHYSICAL EXAM  General: Pleasant, NAD. Neuro: Alert and oriented X 3. Moves all extremities spontaneously. Psych: Normal affect. HEENT:  Normal  Neck: Supple without bruits or JVD. Lungs:  Resp regular and unlabored, CTA. Heart: RRR no s3, s4, 2/6 SEM throughout. Abdomen: Soft, non-tender, non-distended, BS + x 4.  Extremities: No clubbing,  cyanosis or edema. DP/PT/Radials 2+ and equal bilaterally.  Accessory Clinical Findings  CBC  Recent Labs  08/16/13 1325  08/17/13 1600 08/18/13 0553  WBC 7.3  < > 6.7 7.5  NEUTROABS 5.9  --   --   --   HGB 6.3*  < > 8.2* 8.2*  HCT 19.1*  < > 24.3* 24.1*  MCV 87.2  < > 85.9 86.1  PLT 287  < > 275 292  < > = values in this interval not displayed. Basic Metabolic Panel  Recent Labs  08/16/13 1325 08/17/13 0800  NA 130* 137  K 3.9 3.4*  CL 97 105  CO2 22 20  GLUCOSE 205* 91  BUN 43* 34*  CREATININE 1.80* 1.47*  CALCIUM 9.6 9.3   Liver Function Tests  Recent Labs  08/16/13 1325  AST 19  ALT 12  ALKPHOS 63  BILITOT <0.1*  PROT 6.8  ALBUMIN 3.1*   Cardiac Enzymes  Recent Labs  08/16/13 1325 08/17/13 0800  TROPONINI <0.30 0.57*   TELE   I have reviewed telemetry today August 18, 2013. There is normal sinus rhythm.  rsr  Radiology/Studies  Dg Chest Portable 1 View  08/16/2013   *RADIOLOGY REPORT*  Clinical Data: Shortness  of breath  PORTABLE CHEST - 1 VIEW  Comparison: August 07, 2013, April 27, 2013  Findings: There is no focal infiltrate, pulmonary edema, or pleural effusion.  The mediastinal contour and cardiac silhouette are normal.  The soft tissues and osseous structures are stable.  IMPRESSION: No acute cardiopulmonary disease identified.   Original Report Authenticated By: Sherian Rein, M.D.   ASSESSMENT AND PLAN  1.  Acute on chronic GI blood loss/IDA:  Per IM/GI.  H/H stable this AM. Possible EGD today.  Plavix d/c'd yesterday.  2.  NSTEMI:  Mild troponin elevation in setting of #1.  No chest pain.  Dyspnea resolved with transfusion.  Cont low-dose asa, statin, bb (titrate).  Conservative mgmt of NSTEMI.  3.  HTN:  BP trending up.  Titrate bb to 25mg  bid.  4.  HL:  On statin.  5.  Mod-Sev AS:  Conservative mgmt.  Signed, Nicolasa Ducking NP Patient seen and examined. I agree with the assessment and plan as detailed above. See also my  additional thoughts below.   The note above as written in conjunction with [name]. The patient is mildly confused this morning. His cardiac status is stable. The plan is for conservative management of his cardiac disease. No further workup is recommended. We will sign off.  Willa Rough, MD, [facility] 08/18/2013 7:40 AM

## 2013-08-18 NOTE — Progress Notes (Signed)
[facility] Progress Note [place] TEAM 1 - Stepdown/ICU TEAM   Tamala Ser ZOX:[id number] DOB: 03-13-1920 DOA: 08/16/2013 PCP: Evlyn Courier, MD  Admit HPI / Brief Narrative: 79 y.o. male with past medical history of severe aortic stenosis and CAD who was discharged from the hospital 5 days prior to this admit after having a non-ST elevation MI with acute diastolic heart failure. Patient was already on aspirin therefore Plavix was added to his regimen at that time. His hemoglobin at time of discharge was 9.7 and his creatinine was at baseline (1.47).  For the 24 hours prior to readmit he noted increasing shortness of breath and mild chest discomfort. He had not noted any change in his stools, saying that he always has black looking stools because he is on iron. Shortness of breath became more sever and he therefore came to the emergency room. He noted no fever or cough.   Upon arrival to the emergency room patient was noted to have a BUN of 43 with creatinine of 1.8. His hemoglobin was 6.3 with a normal MCV. Hemoccult was done and was noted to be heme positive. Hospitalists were called for further evaluation, and the decision was ultimatley made to transfer the pt to [facility] for proximity to cardiac services.  Assessment/Plan:  GIB GI following - Plavix discontinued - EGD today as per GI note of 9/21 (pt has been kept npo since MN)  Acute blood loss anemia  Hemoglobin nadir of 6.3 - status post 2 units packed red blood cells with appropriate response - recent baseline hemoglobin 9.7 - cont to follow trend   Recent NSTEMI Troponin elevated - Cardiology following - plan is for conservative management of cardiac dz - Cards has signed off  Severe AoS Well compensated at this time - care w/ fluid management   DM CBGs well-controlled at this time  HTN Well-controlled at the present time  Gout Quiescent  Acute renal insult on CKD with baseline crt 1.47 (stage 3, GFR 30-59  ml/min) Creatinine has returned to baseline - acute insult likely due to prerenal state  Chronic diastolic CHF  Strict Is/Os - daily weights - well compensated at this time  Mild hypokalemia Likely due to poor intake in setting of diuretic therapy - replaced   Code Status: FULL Family Communication: patient - he states does not have any family Disposition Plan: Transfer to Floor - await EGD   Consultants: Cardiology GI  Procedures: none  Antibiotics: none  DVT prophylaxis: SCDs  HPI/Subjective: Patient remains alert and oriented. Patient expresses frustration over the care he has received since transfer to [facility]. Per his discussion with attending at [facility] he was under the impression he would undergo endoscopic evaluation for his symptoms as soon as he arrived to this facility which unfortunately did not occur. He also expressed dissatisfaction with the nursing care he has received and has discussed this with the nursing management on the current unit.   Objective: Blood pressure 159/59, pulse 72, temperature 97.4 F (36.3 C), temperature source Oral, resp. rate 15, height 5\' 5"  (1.651 m), weight 66.3 kg (146 lb 2.6 oz), SpO2 98.00%.  Intake/Output Summary (Last 24 hours) at 08/18/13 1242 Last data filed at 08/18/13 1139  Gross per 24 hour  Intake      6 ml  Output    825 ml  Net   -819 ml   Exam: General: No acute respiratory distress Lungs: Clear to auscultation bilaterally without wheezes or crackles Cardiovascular: Regular rate  and rhythm without gallop or rub n- 2/6 holosystolic M Abdomen: Nontender, nondistended, soft, bowel sounds positive, no rebound, no ascites, no appreciable mass Extremities: No significant cyanosis, clubbing, edema bilateral lower extremities  Data Reviewed: Basic Metabolic Panel:  Recent Labs Lab 08/16/13 1325 08/17/13 0800 08/18/13 0553  NA 130* 137 140  K 3.9 3.4* 4.1  CL 97 105 110  CO2 22 20 22   GLUCOSE 205* 91 125*  BUN  43* 34* 27*  CREATININE 1.80* 1.47* 1.36*  CALCIUM 9.6 9.3 9.6   Liver Function Tests:  Recent Labs Lab 08/16/13 1325  AST 19  ALT 12  ALKPHOS 63  BILITOT <0.1*  PROT 6.8  ALBUMIN 3.1*   CBC:  Recent Labs Lab 08/16/13 1325 08/16/13 2200 08/17/13 0800 08/17/13 1600 08/18/13 0553  WBC 7.3  --  7.1 6.7 7.5  NEUTROABS 5.9  --   --   --   --   HGB 6.3* 7.3* 8.3* 8.2* 8.2*  HCT 19.1* 21.5* 24.3* 24.3* 24.1*  MCV 87.2  --  85.3 85.9 86.1  PLT 287  --  257 275 292   Cardiac Enzymes:  Recent Labs Lab 08/16/13 1325 08/17/13 0800  TROPONINI <0.30 0.57*   BNP (last 3 results)  Recent Labs  08/06/13 0321  PROBNP 222.8   CBG:  Recent Labs Lab 08/17/13 1236 08/17/13 1733 08/17/13 2012 08/18/13 0016 08/18/13 0429  GLUCAP 112* 155* 193* 197* 121*     Studies:  Recent x-ray studies have been reviewed in detail by the Attending Physician  Scheduled Meds:  Scheduled Meds: . aspirin EC  81 mg Oral Daily  . atorvastatin  40 mg Oral q1800  . influenza vac split quadrivalent PF  0.5 mL Intramuscular Tomorrow-1000  . insulin aspart  0-9 Units Subcutaneous TID WC  . metoprolol  25 mg Oral BID  . pantoprazole (PROTONIX) IV  40 mg Intravenous Q12H  . sodium chloride  3 mL Intravenous Q12H    Time spent on care of this patient: 35 mins   ELLIS,ALLISON L.  [facility] Office  [phone] Pager - Text Page per Loretha Stapler as per below:  On-Call/Text Page:      [email or web address]      password [email or web address]  If 7PM-7AM, please contact night-coverage [email or web address] Password [facility] 08/18/2013, 12:42 PM   LOS: 2 days   I have personally examined this patient and reviewed the entire database. I have reviewed the above note, made any necessary editorial changes, and agree with its content.  Lonia Blood, MD [facility]

## 2013-08-18 NOTE — Progress Notes (Signed)
Unassigned patient Primary GI is Dr. Raj Janus Subjective: Patient seems to be doing well after admission. He gives a long standing history of IDA and has been taking iron supplements off and on for several months. He has a visitor at the bedside who has several questions and concerns about the timing of the EGD. The patient denies having any abdominal pain, nausea or vomiting. He has a fairly good appetite and wants to eat solid food. He had a tarry BM today. He denies having any hematochezia. He suffers from constipation and takes stool softeners on a regular basis. He claims he is uptodate on his colonoscopies. He denies having any abdominal pain, nausea or vomiting. He was started on Plavix after his recent NSTEMI. This has been on hold since admission.    Objective: Vital signs in last 24 hours: Temp:  [97.4 F (36.3 C)-98.9 F (37.2 C)] 98 F (36.7 C) (09/22 1645) Pulse Rate:  [59-90] 64 (09/22 1645) Resp:  [12-24] 16 (09/22 1645) BP: (140-172)/(39-99) 145/50 mmHg (09/22 1600) SpO2:  [95 %-98 %] 98 % (09/22 1645) Weight:  [66.3 kg (146 lb 2.6 oz)] 66.3 kg (146 lb 2.6 oz) (09/22 0421) Last BM Date: 08/15/13  Intake/Output from previous day: 09/21 0701 - 09/22 0700 In: 3 [I.V.:3] Out: 1300 [Urine:1300] Intake/Output this shift:   General appearance: alert, cooperative, appears stated age, fatigued and no distress Resp: clear to auscultation bilaterally Cardio: regular rate and rhythm, S1, S2 normal, 2/6 SEM present GI: soft, non-tender; bowel sounds normal; no masses,  no organomegaly Extremities: extremities normal, atraumatic, no cyanosis or edema  Lab Results:  Recent Labs  08/17/13 0800 08/17/13 1600 08/18/13 0553  WBC 7.1 6.7 7.5  HGB 8.3* 8.2* 8.2*  HCT 24.3* 24.3* 24.1*  PLT 257 275 292   BMET  Recent Labs  08/16/13 1325 08/17/13 0800 08/18/13 0553  NA 130* 137 140  K 3.9 3.4* 4.1  CL 97 105 110  CO2 22 20 22   GLUCOSE 205* 91 125*  BUN 43* 34* 27*   CREATININE 1.80* 1.47* 1.36*  CALCIUM 9.6 9.3 9.6   LFT  Recent Labs  08/16/13 1325  PROT 6.8  ALBUMIN 3.1*  AST 19  ALT 12  ALKPHOS 63  BILITOT <0.1*   Medications: I have reviewed the patient's current medications.  Assessment/Plan: 1) Iron deficiency anemia with melena in the setting of recent NSTEMI-will plan an EGD on Thursday. Continue serial CBC's for now. In view of his history of aortic stenosis, he may have AVM's causing his IDA. Will follow CBC's closely.   2) GERD on PPI's.  3) Renal insufficiency.   LOS: 2 days   Fraida Veldman 08/18/2013, 8:05 PM

## 2013-08-18 NOTE — Progress Notes (Addendum)
Patient has been requesting for nursing administrator since we arrived to the unit.  He complaint that he had not have a bath for three days.  I spoke to the patient and told him that he came to our  to the unit on Saturday evening and CHG was given by Nursing Staff.    I informed the AM NT  that patient needs a bath prior to procedure, EGD.  When NT went to patient's room to give him a bath, he refused stating that he needs to see the Nursing Administrator.  He stated that somebody had promised him that he will be bathed this morning and nobody came to see him.  He refused his morning meds, vitals signs and blood sugar.   I spoke again with him stating that we are just coming in and it is not fair for [place] to be blamed for what had happened prior to this shift.  He stated that he did not blame [place].  He just wanted to see the Nursing administrator.  I informed him that as soon as they are here, I will send them to see him.  I encouraged him if we can give him a bath and take his vital signs and blood sugar before he ENDO will pick him for EGD.  He continued to refuse.

## 2013-08-19 DIAGNOSIS — D62 Acute posthemorrhagic anemia: Secondary | ICD-10-CM

## 2013-08-19 DIAGNOSIS — R7989 Other specified abnormal findings of blood chemistry: Secondary | ICD-10-CM

## 2013-08-19 LAB — CBC
HCT: 24.1 % — ABNORMAL LOW (ref 39.0–52.0)
Hemoglobin: 8.1 g/dL — ABNORMAL LOW (ref 13.0–17.0)
MCH: 29.2 pg (ref 26.0–34.0)
RBC: 2.77 MIL/uL — ABNORMAL LOW (ref 4.22–5.81)

## 2013-08-19 LAB — GLUCOSE, CAPILLARY
Glucose-Capillary: 134 mg/dL — ABNORMAL HIGH (ref 70–99)
Glucose-Capillary: 108 mg/dL — ABNORMAL HIGH (ref 70–99)
Glucose-Capillary: 124 mg/dL — ABNORMAL HIGH (ref 70–99)
Glucose-Capillary: 139 mg/dL — ABNORMAL HIGH (ref 70–99)

## 2013-08-19 MED ORDER — SODIUM CHLORIDE 0.9 % IV SOLN: INTRAVENOUS | Status: DC

## 2013-08-19 NOTE — Progress Notes (Signed)
NURSING PROGRESS NOTE  Lavarius Doughten [id number] Transfer Data: 08/19/2013 4:10 PM Attending Provider: Calvert Cantor, MD NWG:NFAO,ZHYQMV K, MD Code Status: full   Law Corsino is a 79 y.o. male patient transferred from 2c  -No acute distress noted.  -No complaints of shortness of breath.  -No complaints of chest pain.    Blood pressure 132/40, pulse 66, temperature 97.7 F (36.5 C), temperature source Oral, resp. rate 20, height 5\' 5"  (1.651 m), weight 66.3 kg (146 lb 2.6 oz), SpO2 98.00%.   IV Fluids:  IV in place, occlusive dsg intact without redness, IV cath forearm right, condition patent and no redness normal saline.   Allergies:  Review of patient's allergies indicates no known allergies.  Past Medical History:   has a past medical history of Diabetes mellitus; Hypertension; Arthritis; Prostate cancer; Gout; Coronary artery disease; GERD (gastroesophageal reflux disease); CKD (chronic kidney disease), stage III; Iron deficiency anemia; Heme positive stool; and Moderate to severe aortic stenosis.  Past Surgical History:   has past surgical history that includes Prostate surgery; Umbilical hernia repair; and Carotid endarterectomy.  Social History:   reports that he has quit smoking. He does not have any smokeless tobacco history on file. He reports that he does not drink alcohol or use illicit drugs.  Skin: intact  Patient/Family orientated to room. Information packet given to patient/family. Admission inpatient armband information verified with patient/family to include name and date of birth and placed on patient arm. Side rails up x 2, fall assessment and education completed with patient/family. Patient/family able to verbalize understanding of risk associated with falls and verbalized understanding to call for assistance before getting out of bed. Call light within reach. Patient/family able to voice and demonstrate understanding of unit orientation instructions.    Will  continue to evaluate and treat per MD orders.  Madelin Rear, MSN, RN, [employer]

## 2013-08-19 NOTE — Progress Notes (Signed)
[facility] Progress Note [place] TEAM 1 - Stepdown/ICU TEAM   Tamala Ser ZOX:[id number] DOB: [birth year] DOA: 08/16/2013 PCP: Evlyn Courier, MD  Admit HPI / Brief Narrative: 79 y.o. male with past medical history of severe aortic stenosis and CAD who was discharged from the hospital 5 days prior to this admit after having a non-ST elevation MI with acute diastolic heart failure. Patient was already on aspirin therefore Plavix was added to his regimen at that time. His hemoglobin at time of discharge was 9.7 and his creatinine was at baseline (1.47).  For the 24 hours prior to readmit he noted increasing shortness of breath and mild chest discomfort. He had not noted any change in his stools, saying that he always has black looking stools because he is on iron. Shortness of breath became more sever and he therefore came to the emergency room. He noted no fever or cough.   Upon arrival to the emergency room patient was noted to have a BUN of 43 with creatinine of 1.8. His hemoglobin was 6.3 with a normal MCV. Hemoccult was done and was noted to be heme positive. Hospitalists were called for further evaluation, and the decision was ultimatley made to transfer the pt to [facility] for proximity to cardiac services.  Assessment/Plan:  GIB GI following - Plavix discontinued - EGD planned for 9/25- no further dark stools since early 9/22  Acute blood loss anemia  Hemoglobin nadir of 6.3 - status post 2 units packed red blood cells with appropriate response - recent baseline hemoglobin 9.7 - cont to follow trend -today is 8.1  Recent NSTEMI Troponin elevated - Cardiology following - plan is for conservative management of cardiac dz - Cards has signed off  Severe AoS Well compensated at this time - care w/ fluid management   DM CBGs well-controlled at this time  HTN Well-controlled at the present time  Gout Quiescent  Acute renal insult on CKD with baseline crt 1.47 (stage 3, GFR 30-59  ml/min) Creatinine has returned to baseline - acute insult likely due to prerenal state  Chronic diastolic CHF  Strict Is/Os - daily weights - well compensated at this time  Mild hypokalemia Likely due to poor intake in setting of diuretic therapy - replaced   Code Status: FULL Family Communication: patient - he states does not have any family Disposition Plan: Transfer to Floor - await EGD   Consultants: Cardiology GI  Procedures: none  Antibiotics: none  DVT prophylaxis: SCDs  HPI/Subjective: Patient remains alert and oriented. No SOB or CP.   Objective: Blood pressure 154/51, pulse 74, temperature 98.3 F (36.8 C), temperature source Oral, resp. rate 22, height 5\' 5"  (1.651 m), weight 66.3 kg (146 lb 2.6 oz), SpO2 95.00%.  Intake/Output Summary (Last 24 hours) at 08/19/13 1141 Last data filed at 08/19/13 0600  Gross per 24 hour  Intake 726.17 ml  Output    925 ml  Net -198.83 ml   Exam: General: No acute respiratory distress Lungs: Clear to auscultation bilaterally without wheezes or crackles Cardiovascular: Regular rate and rhythm without gallop or rub - 2/6 holosystolic M/Ao Abdomen: Nontender, nondistended, soft, bowel sounds positive, no rebound, no ascites, no appreciable mass Extremities: No significant cyanosis, clubbing, edema bilateral lower extremities  Data Reviewed: Basic Metabolic Panel:  Recent Labs Lab 08/16/13 1325 08/17/13 0800 08/18/13 0553  NA 130* 137 140  K 3.9 3.4* 4.1  CL 97 105 110  CO2 22 20 22   GLUCOSE 205* 91 125*  BUN 43* 34* 27*  CREATININE 1.80* 1.47* 1.36*  CALCIUM 9.6 9.3 9.6   Liver Function Tests:  Recent Labs Lab 08/16/13 1325  AST 19  ALT 12  ALKPHOS 63  BILITOT <0.1*  PROT 6.8  ALBUMIN 3.1*   CBC:  Recent Labs Lab 08/16/13 1325 08/16/13 2200 08/17/13 0800 08/17/13 1600 08/18/13 0553 08/19/13 0357  WBC 7.3  --  7.1 6.7 7.5 6.9  NEUTROABS 5.9  --   --   --   --   --   HGB 6.3* 7.3* 8.3* 8.2*  8.2* 8.1*  HCT 19.1* 21.5* 24.3* 24.3* 24.1* 24.1*  MCV 87.2  --  85.3 85.9 86.1 87.0  PLT 287  --  257 275 292 318   Cardiac Enzymes:  Recent Labs Lab 08/16/13 1325 08/17/13 0800  TROPONINI <0.30 0.57*   BNP (last 3 results)  Recent Labs  08/06/13 0321  PROBNP 222.8   CBG:  Recent Labs Lab 08/18/13 0429 08/18/13 1143 08/18/13 1641 08/19/13 0046 08/19/13 0834  GLUCAP 121* 144* 131* 134* 108*     Studies:  Recent x-ray studies have been reviewed in detail by the Attending Physician  Scheduled Meds:  Scheduled Meds: . aspirin EC  81 mg Oral Daily  . atorvastatin  40 mg Oral q1800  . hydrALAZINE  50 mg Oral TID  . insulin aspart  0-9 Units Subcutaneous TID WC  . metoprolol  25 mg Oral BID  . pantoprazole (PROTONIX) IV  40 mg Intravenous Q12H  . sodium chloride  3 mL Intravenous Q12H    Time spent on care of this patient: 35 mins   ELLIS,ALLISON L. ANP  [facility] Office  [phone] Pager - Text Page per Loretha Stapler as per below:  On-Call/Text Page:      [email or web address]      password [email or web address]  If 7PM-7AM, please contact night-coverage [email or web address] Password [facility] 08/19/2013, 11:41 AM   LOS: 3 days    I have examined the patient, reviewed the chart and modified the above note which I agree with.   Finbar Nippert,MD [phone] 08/19/2013, 4:37 PM

## 2013-08-19 NOTE — Progress Notes (Signed)
Utilization Review Completed.  

## 2013-08-19 NOTE — Progress Notes (Signed)
Subjective: Melena yesterday.  Feels well.  Objective: Vital signs in last 24 hours: Temp:  [97.7 F (36.5 C)-98.3 F (36.8 C)] 97.7 F (36.5 C) (09/23 1225) Pulse Rate:  [57-79] 66 (09/23 1400) Resp:  [12-22] 20 (09/23 1540) BP: (132-164)/(36-57) 132/40 mmHg (09/23 1400) SpO2:  [95 %-100 %] 98 % (09/23 1540) Weight:  [146 lb 2.6 oz (66.3 kg)] 146 lb 2.6 oz (66.3 kg) (09/23 0445) Last BM Date: 08/18/13  Intake/Output from previous day: 09/22 0701 - 09/23 0700 In: 729.2 [P.O.:237; I.V.:492.2] Out: 1265 [Urine:1265] Intake/Output this shift: Total I/O In: 650 [P.O.:350; I.V.:300] Out: 450 [Urine:450]  General appearance: alert and no distress GI: soft, non-tender; bowel sounds normal; no masses,  no organomegaly  Lab Results:  Recent Labs  08/17/13 1600 08/18/13 0553 08/19/13 0357  WBC 6.7 7.5 6.9  HGB 8.2* 8.2* 8.1*  HCT 24.3* 24.1* 24.1*  PLT 275 292 318   BMET  Recent Labs  08/17/13 0800 08/18/13 0553  NA 137 140  K 3.4* 4.1  CL 105 110  CO2 20 22  GLUCOSE 91 125*  BUN 34* 27*  CREATININE 1.47* 1.36*  CALCIUM 9.3 9.6   LFT No results found for this basename: PROT, ALBUMIN, AST, ALT, ALKPHOS, BILITOT, BILIDIR, IBILI,  in the last 72 hours PT/INR No results found for this basename: LABPROT, INR,  in the last 72 hours Hepatitis Panel No results found for this basename: HEPBSAG, HCVAB, HEPAIGM, HEPBIGM,  in the last 72 hours C-Diff No results found for this basename: CDIFFTOX,  in the last 72 hours Fecal Lactopherrin No results found for this basename: FECLLACTOFRN,  in the last 72 hours  Studies/Results: No results found.  Medications:  Scheduled: . aspirin EC  81 mg Oral Daily  . atorvastatin  40 mg Oral q1800  . hydrALAZINE  50 mg Oral TID  . insulin aspart  0-9 Units Subcutaneous TID WC  . metoprolol  25 mg Oral BID  . pantoprazole (PROTONIX) IV  40 mg Intravenous Q12H  . sodium chloride  3 mL Intravenous Q12H   Continuous: . sodium  chloride 50 mL/hr at 08/18/13 2013  . sodium chloride      Assessment/Plan: 1) IDA. 2) Melena. 3) CAD s/p MI last week.  Medical management.   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  Since he has melena and he has an anemia I will pursue an EGD with possible APC.  The prior EGD also revealed multiple gastric erosions.  Plan: 1) EGD tomorrow at 12:15 PM.     LOS: 3 days   Kearia Yin D 08/19/2013, 4:15 PM  

## 2013-08-20 ENCOUNTER — Encounter (HOSPITAL_COMMUNITY)
Admission: EM | Disposition: A | Discharge status: Home / Self Care | Payer: Self-pay | Source: Home / Self Care | Attending: Internal Medicine

## 2013-08-20 ENCOUNTER — Encounter (HOSPITAL_COMMUNITY): Payer: Self-pay | Admitting: *Deleted

## 2013-08-20 HISTORY — PX: ESOPHAGOGASTRODUODENOSCOPY: SHX5428

## 2013-08-20 LAB — BASIC METABOLIC PANEL
BUN: 15 mg/dL (ref 6–23)
CO2: 19 mEq/L (ref 19–32)
Calcium: 9.2 mg/dL (ref 8.4–10.5)
Chloride: 114 mEq/L — ABNORMAL HIGH (ref 96–112)
Creatinine, Ser: 1.29 mg/dL (ref 0.50–1.35)
GFR calc Af Amer: 54 mL/min — ABNORMAL LOW (ref >90)
Glucose, Bld: 103 mg/dL — ABNORMAL HIGH (ref 70–99)
Potassium: 4.1 mEq/L (ref 3.5–5.1)

## 2013-08-20 LAB — CBC
HCT: 24.3 % — ABNORMAL LOW (ref 39.0–52.0)
MCH: 29.1 pg (ref 26.0–34.0)
MCHC: 33.3 g/dL (ref 30.0–36.0)
MCV: 87.4 fL (ref 78.0–100.0)
Platelets: 303 10*3/uL (ref 150–400)
RDW: 15.3 % (ref 11.5–15.5)

## 2013-08-20 LAB — GLUCOSE, CAPILLARY
Glucose-Capillary: 112 mg/dL — ABNORMAL HIGH (ref 70–99)
Glucose-Capillary: 208 mg/dL — ABNORMAL HIGH (ref 70–99)

## 2013-08-20 SURGERY — EGD (ESOPHAGOGASTRODUODENOSCOPY)
Anesthesia: Moderate Sedation

## 2013-08-20 MED ORDER — DIPHENHYDRAMINE HCL 50 MG/ML IJ SOLN
INTRAMUSCULAR | Status: AC
  Filled 2013-08-20: qty 1

## 2013-08-20 MED ORDER — FENTANYL CITRATE 0.05 MG/ML IJ SOLN
INTRAMUSCULAR | Status: AC
  Filled 2013-08-20: qty 4

## 2013-08-20 MED ORDER — MIDAZOLAM HCL 5 MG/ML IJ SOLN
INTRAMUSCULAR | Status: AC
  Filled 2013-08-20: qty 2

## 2013-08-20 MED ORDER — INSULIN ASPART 100 UNIT/ML ~~LOC~~ SOLN
0.0000 [IU] | Freq: Three times a day (TID) | SUBCUTANEOUS | Status: DC
  Administered 2013-08-21: 2 [IU] via SUBCUTANEOUS

## 2013-08-20 MED ORDER — MIDAZOLAM HCL 10 MG/2ML IJ SOLN
INTRAMUSCULAR | Status: DC | PRN
  Administered 2013-08-20 (×2): 2 mg via INTRAVENOUS

## 2013-08-20 MED ORDER — FENTANYL CITRATE 0.05 MG/ML IJ SOLN
INTRAMUSCULAR | Status: DC | PRN
  Administered 2013-08-20: 25 ug via INTRAVENOUS

## 2013-08-20 MED ORDER — PANTOPRAZOLE SODIUM 40 MG PO TBEC
40.0000 mg | DELAYED_RELEASE_TABLET | Freq: Two times a day (BID) | ORAL | Status: DC
  Administered 2013-08-20 – 2013-08-21 (×2): 40 mg via ORAL
  Filled 2013-08-20 (×2): qty 1

## 2013-08-20 MED ORDER — BUTAMBEN-TETRACAINE-BENZOCAINE 2-2-14 % EX AERO
INHALATION_SPRAY | CUTANEOUS | Status: DC | PRN
  Administered 2013-08-20: 2 via TOPICAL

## 2013-08-20 NOTE — Interval H&P Note (Signed)
History and Physical Interval Note:  08/20/2013 12:15 PM  [name]  has presented today for surgery, with the diagnosis of Anemia and Melena  The various methods of treatment have been discussed with the patient and family. After consideration of risks, benefits and other options for treatment, the patient has consented to  Procedure(s): ESOPHAGOGASTRODUODENOSCOPY (EGD) (N/A) as a surgical intervention .  The patient's history has been reviewed, patient examined, no change in status, stable for surgery.  I have reviewed the patient's chart and labs.  Questions were answered to the patient's satisfaction.     [name] D

## 2013-08-20 NOTE — H&P (View-Only) (Signed)
Subjective: Melena yesterday.  Feels well.  Objective: Vital signs in last 24 hours: Temp:  [97.7 F (36.5 C)-98.3 F (36.8 C)] 97.7 F (36.5 C) (09/23 1225) Pulse Rate:  [57-79] 66 (09/23 1400) Resp:  [12-22] 20 (09/23 1540) BP: (132-164)/(36-57) 132/40 mmHg (09/23 1400) SpO2:  [95 %-100 %] 98 % (09/23 1540) Weight:  [146 lb 2.6 oz (66.3 kg)] 146 lb 2.6 oz (66.3 kg) (09/23 0445) Last BM Date: 08/18/13  Intake/Output from previous day: 09/22 0701 - 09/23 0700 In: 729.2 [P.O.:237; I.V.:492.2] Out: 1265 [Urine:1265] Intake/Output this shift: Total I/O In: 650 [P.O.:350; I.V.:300] Out: 450 [Urine:450]  General appearance: alert and no distress GI: soft, non-tender; bowel sounds normal; no masses,  no organomegaly  Lab Results:  Recent Labs  08/17/13 1600 08/18/13 0553 08/19/13 0357  WBC 6.7 7.5 6.9  HGB 8.2* 8.2* 8.1*  HCT 24.3* 24.1* 24.1*  PLT 275 292 318   BMET  Recent Labs  08/17/13 0800 08/18/13 0553  NA 137 140  K 3.4* 4.1  CL 105 110  CO2 20 22  GLUCOSE 91 125*  BUN 34* 27*  CREATININE 1.47* 1.36*  CALCIUM 9.3 9.6   LFT No results found for this basename: PROT, ALBUMIN, AST, ALT, ALKPHOS, BILITOT, BILIDIR, IBILI,  in the last 72 hours PT/INR No results found for this basename: LABPROT, INR,  in the last 72 hours Hepatitis Panel No results found for this basename: HEPBSAG, HCVAB, HEPAIGM, HEPBIGM,  in the last 72 hours C-Diff No results found for this basename: CDIFFTOX,  in the last 72 hours Fecal Lactopherrin No results found for this basename: FECLLACTOFRN,  in the last 72 hours  Studies/Results: No results found.  Medications:  Scheduled: . aspirin EC  81 mg Oral Daily  . atorvastatin  40 mg Oral q1800  . hydrALAZINE  50 mg Oral TID  . insulin aspart  0-9 Units Subcutaneous TID WC  . metoprolol  25 mg Oral BID  . pantoprazole (PROTONIX) IV  40 mg Intravenous Q12H  . sodium chloride  3 mL Intravenous Q12H   Continuous: . sodium  chloride 50 mL/hr at 08/18/13 2013  . sodium chloride      Assessment/Plan: 1) IDA. 2) Melena. 3) CAD s/p MI last week.  Medical management.   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  Since he has melena and he has an anemia I will pursue an EGD with possible APC.  The prior EGD also revealed multiple gastric erosions.  Plan: 1) EGD tomorrow at 12:15 PM.     LOS: 3 days   Lindsie Simar D 08/19/2013, 4:15 PM

## 2013-08-20 NOTE — Op Note (Signed)
Eligha Bridegroom [facility] [address] [place], [address]   OPERATIVE PROCEDURE REPORT  PATIENT: [name], [name]  MR#: [id number] BIRTHDATE: 25-Mar-1920  GENDER: Male ENDOSCOPIST: Jeani Hawking, MD ASSISTANT:   Percival Spanish, Technician and Karoline Caldwell, RN, BSN  PROCEDURE DATE: 08/20/2013 PROCEDURE:   EGD w/ ablation ASA CLASS: INDICATIONS:History AVMs and melena. MEDICATIONS: Fentanyl 50 mcg IV and Versed 4 mg IV TOPICAL ANESTHETIC:   Cetacaine Spray  DESCRIPTION OF PROCEDURE:   After the risks benefits and alternatives of the procedure were thoroughly explained, informed consent was obtained.  The Pentax Gastroscope [id number]  endoscope was introduced through the mouth  and advanced to the second portion of the duodenum Without limitations.      The instrument was slowly withdrawn as the mucosa was fully examined.      FINDINGS: In the gastric lumen there was evidence of multiple small to medium-sized nonbleeding gastric AVMs.  I estimate that there were 10 AVMs.  All the AVMs were ablated wtih APC with no significant stigmata of bleeding.  No other abnormalites were identifed in the esophagus or the duodenum.          The scope was then withdrawn from the patient and the procedure terminated.  COMPLICATIONS: There were no complications. IMPRESSION: 1) Multiple nonbleeding gastric AVMs s/p APC.  RECOMMENDATIONS: 1) Follow HGB. 2) Transfuse as necessary. 3) Avoid anticoagulation. 4) If melena persists a colonoscopy may be required with his history of cecal AVMs.  _______________________________ eSigned:  Jeani Hawking, MD 08/20/2013 12:46 PM

## 2013-08-20 NOTE — Progress Notes (Signed)
[facility] PROGRESS NOTE  [name] AOZ:[id number] DOB: 09-20-1920 DOA: 08/16/2013 PCP: Evlyn Courier, MD  HPI/Subjective: 79 y.o. male who was transferred from [facility] (08/16/13) after being admitted there with complaints of weakness and shortness of breath as well as chest discomfort. He has history of coronary artery disease and is status post a very recent MI (D/C from [facility] 08/11/13 with non-ST elevation MI treated conservatively with ASA and plavix). He also has severe aortic stenosis and known history of colonic and gastric AVMs and chronic iron deficiency anemia as well as diabetes.  Patient noted having black, tarry stools that he equated to the iron he takes.  HgB on discharge from [facility] was 9.7, on admission it had dropped to 6.3.  He was hemoccult positive.  Plavix has been discontinued.  Patient was unhappy upon me entering the room.  He stated he wanted to eat, he didn't want his CBG checked so often, and he was just ready to go home.  After a long discussion with him about what we were doing, and why we were doing it, he seemed much more compliant and allowed his CBG to be tested, and was willing to wait until after his EGD to eat.  He had no other complaints.  Assessment/Plan: Acute blood loss Anemia: - Secondary to GI bleed; hemoccult positive - Hgb 9.7 (08/11/13), HgB 6.3 (08/16/13), HgB 8.1 (08/20/13) - Was given 2 units PRBC at [facility]; on oral iron - D/C Plavix; HgB stabilizing - Will have EGD today 08/20/13 confirms GI non bleeding AVMs  - stable H&H, no further bleeds, Will continue to monitor  GI Bleed: - Secondary to Plavix  - H/O gastric and colonic AVMs - GI following - EGD 08/20/13  Recent NSTEMI: - Elevated troponins 0.57 08/17/13 - Cardiology would like to treat conservatively; has signed off - Continue to monitor - Outpatient cardiology follow-ups  Severe Aortic Stenosis: - Long-standing history - Well compensated - Will continue to  monitor - Outpatient cardiology follow-ups  Diabetes Mellitis: - Stable - Will continue to monitor  Hypertension: - Elevated 162/66 08/20/13 - Continue hydralazine and metoprolol - Continue to monitor and adjust meds if needed  Acute Renal Injury on chronic: - Baseline Cr 1.47, Cr 1.80 08/16/13, Cr 1.29 08/20/13 - Cr returned to baseline - Will continue to monitor  Chronic Diastolic HF: - Well compensated - Strict I&Os with daily weights - Will continue to monitor and treat as necessary  Mild Hypokalemia: - Poor intake with diuretic therapy for CHF - Replaced and stable - Will continue to monitor  Code Status: Full Family Communication: None at bedside  Disposition Plan: Remain inpatient; Plan D/C to home tomorrow (08/21/13) if EGD is normal   Consultants:  GI  Cardiology  Procedures:  EGD  Antibiotics:  None     Objective: Filed Vitals:   08/20/13 0516  BP: 162/66  Pulse: 70  Temp: 98.1 F (36.7 C)  Resp: 20    Intake/Output Summary (Last 24 hours) at 08/20/13 1033 Last data filed at 08/20/13 0624  Gross per 24 hour  Intake   1020 ml  Output   1375 ml  Net   -355 ml   Filed Weights   08/16/13 1830 08/18/13 0421 08/19/13 0445  Weight: 65.7 kg (144 lb 13.5 oz) 66.3 kg (146 lb 2.6 oz) 66.3 kg (146 lb 2.6 oz)    Exam:   General:  Sitting comfortably in bed and in no apparent distress  Cardiovascular:  RRR, loud IV/VI systolic murmur heard throughout, but best heard at aortic area.  No gallops or rubs.  Respiratory: Clear to auscultation bilaterally.  No rales, rhonchi, or wheezes.  Abdomen: Soft, non-tender, no masses or hernias.  Bowel sounds heard in all 4 quadrants  Musculoskeletal: Full ROM.  Strength 5/5 all extremities.  No peripheral edema.  Distal pulses intact and symmetric.   Psychiatric: Alert and oriented x 3.  Normal affect.  Responds appropriately.  More cooperative.  Data Reviewed: Basic Metabolic Panel:  Recent Labs Lab  08/16/13 1325 08/17/13 0800 08/18/13 0553 08/20/13 0551  NA 130* 137 140 144  K 3.9 3.4* 4.1 4.1  CL 97 105 110 114*  CO2 22 20 22 19   GLUCOSE 205* 91 125* 103*  BUN 43* 34* 27* 15  CREATININE 1.80* 1.47* 1.36* 1.29  CALCIUM 9.6 9.3 9.6 9.2   Liver Function Tests:  Recent Labs Lab 08/16/13 1325  AST 19  ALT 12  ALKPHOS 63  BILITOT <0.1*  PROT 6.8  ALBUMIN 3.1*   CBC:  Recent Labs Lab 08/16/13 1325  08/17/13 0800 08/17/13 1600 08/18/13 0553 08/19/13 0357 08/20/13 0551  WBC 7.3  --  7.1 6.7 7.5 6.9 5.7  NEUTROABS 5.9  --   --   --   --   --   --   HGB 6.3*  < > 8.3* 8.2* 8.2* 8.1* 8.1*  HCT 19.1*  < > 24.3* 24.3* 24.1* 24.1* 24.3*  MCV 87.2  --  85.3 85.9 86.1 87.0 87.4  PLT 287  --  257 275 292 318 303  < > = values in this interval not displayed. Cardiac Enzymes:  Recent Labs Lab 08/16/13 1325 08/17/13 0800  TROPONINI <0.30 0.57*   BNP (last 3 results)  Recent Labs  08/06/13 0321  PROBNP 222.8   CBG:  Recent Labs Lab 08/19/13 0834 08/19/13 1243 08/19/13 1751 08/19/13 2222 08/20/13 0856  GLUCAP 108* 124* 139* 109* 112*    Studies: No results found.  Scheduled Meds: . aspirin EC  81 mg Oral Daily  . atorvastatin  40 mg Oral q1800  . hydrALAZINE  50 mg Oral TID  . insulin aspart  0-9 Units Subcutaneous TID WC  . metoprolol  25 mg Oral BID  . pantoprazole (PROTONIX) IV  40 mg Intravenous Q12H  . sodium chloride  3 mL Intravenous Q12H   Continuous Infusions: . sodium chloride 50 mL/hr at 08/19/13 1646  . sodium chloride      Principal Problem:   GI bleed Active Problems:   Acute renal failure   HTN (hypertension)   Aortic stenosis   CKD (chronic kidney disease) stage 3, GFR 30-59 ml/min   GERD (gastroesophageal reflux disease)   Iron deficiency   DM (diabetes mellitus) type II uncontrolled with renal manifestation   NSTEMI (non-ST elevated myocardial infarction)   Acute blood loss anemia   Chronic diastolic CHF (congestive  heart failure)   Iron deficiency anemia   Elevated troponin   Joycelyn Man, PA-S  [facility] Pager [phone]. If 7PM-7AM, please contact night-coverage at [email or web address], password [email or web address] 08/20/2013, 10:33 AM  LOS: 4 days

## 2013-08-21 ENCOUNTER — Encounter (HOSPITAL_COMMUNITY): Payer: Self-pay | Admitting: Gastroenterology

## 2013-08-21 DIAGNOSIS — I5031 Acute diastolic (congestive) heart failure: Secondary | ICD-10-CM

## 2013-08-21 DIAGNOSIS — N179 Acute kidney failure, unspecified: Secondary | ICD-10-CM

## 2013-08-21 LAB — GLUCOSE, CAPILLARY: Glucose-Capillary: 161 mg/dL — ABNORMAL HIGH (ref 70–99)

## 2013-08-21 MED ORDER — PANTOPRAZOLE SODIUM 40 MG PO TBEC: 40.0000 mg | DELAYED_RELEASE_TABLET | Freq: Two times a day (BID) | ORAL | Status: DC

## 2013-08-21 NOTE — Discharge Summary (Signed)
Physician Discharge Summary  [name] HQI:[id number] DOB: 1920/07/21 DOA: 08/16/2013  PCP: Evlyn Courier, MD  Admit date: 08/16/2013 Discharge date: 08/21/2013  Time spent: 45 minutes  Recommendations for Outpatient Follow-up:  1. Recheck CBC, BMP 2. If melena persists, may need colonoscopy f/u.  Multiple gastric AVMs found on EGD 08/20/13. 3. Cardiology- discuss need for ASA and Plavix from recent NSTEMI MI vs bleed risk  Discharge Diagnoses:  Principal Problem:   GI bleed Active Problems:   Acute renal failure   HTN (hypertension)   Aortic stenosis   CKD (chronic kidney disease) stage 3, GFR 30-59 ml/min   GERD (gastroesophageal reflux disease)   Iron deficiency   DM (diabetes mellitus) type II uncontrolled with renal manifestation   NSTEMI (non-ST elevated myocardial infarction)   Acute blood loss anemia   Chronic diastolic CHF (congestive heart failure)   Iron deficiency anemia   Elevated troponin   Discharge Condition: Stable  Diet recommendation: Low-sodium, heart healthy  Filed Weights   08/16/13 1830 08/18/13 0421 08/19/13 0445  Weight: 65.7 kg (144 lb 13.5 oz) 66.3 kg (146 lb 2.6 oz) 66.3 kg (146 lb 2.6 oz)    History of present illness:  79 y.o. male who was transferred from [facility] (08/16/13) after being admitted there with complaints of weakness and shortness of breath as well as chest discomfort. He has history of coronary artery disease and is status post a very recent MI (D/C from [facility] 08/11/13 with non-ST elevation MI treated conservatively with ASA and plavix). He also has severe aortic stenosis and known history of colonic and gastric AVMs and chronic iron deficiency anemia as well as diabetes. Patient noted having black, tarry stools that he equated to the iron he takes. HgB on discharge from [facility] was 9.7, on admission it had dropped to 6.3. He was hemoccult positive. Aspirin and Plavix have been discontinued. Must follow with cardiology and  GI within a week and get this readdressed.    Hospital Course:  Acute blood loss Anemia:  - Secondary to GI bleed; hemoccult positive  - Hgb 9.7 (08/11/13), HgB 6.3 (08/16/13), HgB 8.1 (08/20/13 and 08/21/13) - Was given 2 units PRBC at [facility]; on oral iron  - D/C Plavix and aspirin; HgB stabilizing after EGD 08/20/13 and transfusion - EGD confirms GI non bleeding AVMs  - stable H&H, no further bleeds, Will continue to monitor    GI Bleed:  - Secondary to Plavix  - H/O gastric and colonic AVMs  - GI following  - EGD 08/20/13 confirm gastric AVMs --> Treated   Recent NSTEMI:  - Elevated troponins 0.57 08/17/13  - Cardiology would like to treat conservatively; has signed off  - Continue to monitor  - Outpatient cardiology follow-up to address the need for antiplatelet medication for secondary prevention in the setting of recent upper GI bleed.   Severe Aortic Stenosis:  - Long-standing history  - Well compensated  - Will continue to monitor  - Outpatient cardiology follow-ups    Diabetes Mellitis:  - Stable  - Will continue to monitor    Hypertension:  - Table today  - Continue hydralazine and metoprolol  - Continue to monitor and adjust meds if needed    Acute Renal Injury on chronic:  - Baseline Cr 1.47, Cr 1.80 08/16/13, Cr 1.29 08/20/13  - Cr returned to baseline  - Dinner to monitor an outpatient setting   Chronic Diastolic HF:  - Well compensated  - Strict I&Os  with daily weights  - Will continue to monitor and treat as necessary    Mild Hypokalemia:  - Poor intake with diuretic therapy for CHF  - Replaced and stable  - Will continue to monitor    Procedures:  EGD 08/20/13-- multiple non-bleeding gastric AVMs visualized and carterized    Consultations:  GI  Cardiology  Discharge Exam: Filed Vitals:   08/21/13 0529  BP: 149/60  Pulse: 75  Temp: 98.4 F (36.9 C)  Resp: 18    General: Resting comfortably in bed and in no apparent  distress. Cardiovascular: RRR, loud IV/VI systolic murmur heard throughout, but best heard at aortic area. No gallops or rubs.  Respiratory: Clear to auscultation bilaterally. No rales, rhonchi, or wheezes.  Abdomen: Soft, non-tender, no masses or hernias. Bowel sounds heard in all 4 quadrants Musculoskeletal: Full ROM. Strength 5/5 all extremities. No peripheral edema. Distal pulses intact and symmetric.  Psychiatric: Alert and oriented x 3. Normal affect. Responds appropriately. Cooperative.   Discharge Instructions  Discharge Orders   Future Appointments Provider Department Dept Phone   08/25/2013 2:10 PM Jodelle Gross, NP [facility] Batten at [place] [phone]   09/02/2013 10:30 AM [facility] [phone]   Future Orders Complete By Expires   Diet - low sodium heart healthy  As directed    Scheduling Instructions:     Low carbohydrate   Discharge instructions  As directed    Comments:     Follow with Primary MD HILL,GERALD K, MD in 3 days , follow with the recommended stomach and Heart MD within 1 week.  Get CBC, CMP, checked 3 days by Primary MD and again as instructed by your Primary MD. Get a 2 view Chest X ray done next visit if you had Pneumonia of Lung problems at the Hospital.  Get Medicines reviewed and adjusted.  Please request your Prim.MD to go over all Hospital Tests and Procedure/Radiological results at the follow up, please get all Hospital records sent to your Prim MD by signing hospital release before you go home.  Activity: As tolerated with Full fall precautions use walker/cane & assistance as needed   Diet:  Heart Healthy-Low carbohydrate  For Heart failure patients - Check your Weight same time everyday, if you gain over 2 pounds, or you develop in leg swelling, experience more shortness of breath or chest pain, call your Primary MD immediately. Follow Cardiac Low Salt Diet and 1.8 lit/day fluid  restriction.  Disposition Home    If you experience worsening of your admission symptoms, develop shortness of breath, life threatening emergency, suicidal or homicidal thoughts you must seek medical attention immediately by calling 911 or calling your MD immediately  if symptoms less severe.  You Must read complete instructions/literature along with all the possible adverse reactions/side effects for all the Medicines you take and that have been prescribed to you. Take any new Medicines after you have completely understood and accpet all the possible adverse reactions/side effects.   Do not drive and provide baby sitting services if your were admitted for syncope or siezures until you have seen by Primary MD or a Neurologist and advised to do so again.  Do not drive when taking Pain medications.    Do not take more than prescribed Pain, Sleep and Anxiety Medications  Special Instructions: If you have smoked or chewed Tobacco  in the last 2 yrs please stop smoking, stop any regular Alcohol  and or any Recreational drug use.  Wear Seat belts while driving.   Please note  You were cared for by a hospitalist during your hospital stay. If you have any questions about your discharge medications or the care you received while you were in the hospital after you are discharged, you can call the unit and asked to speak with the hospitalist on call if the hospitalist that took care of you is not available. Once you are discharged, your primary care physician will handle any further medical issues. Please note that NO REFILLS for any discharge medications will be authorized once you are discharged, as it is imperative that you return to your primary care physician (or establish a relationship with a primary care physician if you do not have one) for your aftercare needs so that they can reassess your need for medications and monitor your lab values.   Increase activity slowly  As directed         Medication List    STOP taking these medications       aspirin 81 MG EC tablet     clopidogrel 75 MG tablet  Commonly known as:  PLAVIX      TAKE these medications       allopurinol 100 MG tablet  Commonly known as:  ZYLOPRIM  Take 100 mg by mouth daily.     amLODipine 5 MG tablet  Commonly known as:  NORVASC  Take 5 mg by mouth daily.     atorvastatin 40 MG tablet  Commonly known as:  LIPITOR  Take 1 tablet (40 mg total) by mouth daily at 6 PM.     furosemide 20 MG tablet  Commonly known as:  LASIX  Take 1 tablet (20 mg total) by mouth daily.     hydrALAZINE 50 MG tablet  Commonly known as:  APRESOLINE  Take 1 tablet (50 mg total) by mouth 3 (three) times daily.     insulin glargine 100 UNIT/ML injection  Commonly known as:  LANTUS  Inject 0.24 mLs (24 Units total) into the skin every morning. Inject 23 units if blood sugars are below 150 or inject 28 units if blood sugars are above 150     metoprolol 50 MG tablet  Commonly known as:  LOPRESSOR  Take 1 tablet (50 mg total) by mouth 2 (two) times daily.     pantoprazole 40 MG tablet  Commonly known as:  PROTONIX  Take 1 tablet (40 mg total) by mouth 2 (two) times daily.       No Known Allergies     Follow-up Information   Follow up with HILL,GERALD K, MD. Schedule an appointment as soon as possible for a visit in 3 days.   Specialty:  Family Medicine   Contact information:   [address] [place] [address] [phone]       Follow up with Prentice Docker A, MD. Schedule an appointment as soon as possible for a visit in 1 week.   Specialty:  Cardiology   Contact information:   [address] [place] [address] [phone]       Follow up with HUNG,PATRICK D, MD. Schedule an appointment as soon as possible for a visit in 1 week.   Specialty:  Gastroenterology   Contact information:   [address] [place] [address] [phone]        The results  of significant diagnostics from this hospitalization (including imaging, microbiology, ancillary and laboratory) are listed below for reference.  Significant Diagnostic Studies: Dg Chest Portable 1 View  08/16/2013   *RADIOLOGY REPORT*  Clinical Data: Shortness of breath  PORTABLE CHEST - 1 VIEW  Comparison: August 07, 2013, April 27, 2013  Findings: There is no focal infiltrate, pulmonary edema, or pleural effusion.  The mediastinal contour and cardiac silhouette are normal.  The soft tissues and osseous structures are stable.  IMPRESSION: No acute cardiopulmonary disease identified.   Original Report Authenticated By: Sherian Rein, M.D.   Dg Chest Port 1 View  08/07/2013   *RADIOLOGY REPORT*  Clinical Data: Congestive heart failure  PORTABLE CHEST - 1 VIEW  Comparison: August 06, 2013.  Findings: Cardiomediastinal silhouette appears normal. Degenerative changes of lower thoracic spine are noted.  No pleural effusion or pneumothorax is noted.  The diffuse airspace opacity noted on prior exam appears significantly improved with only minimal residual density remaining.  Bony thorax is intact.  IMPRESSION: Bilateral airspace opacities noted on prior exam are significantly improved and nearly resolved.   Original Report Authenticated By: Lupita Raider.,  M.D.   Dg Chest Portable 1 View  08/06/2013   *RADIOLOGY REPORT*  Clinical Data: Shortness of breath and respiratory distress.  PORTABLE CHEST - 1 VIEW  Comparison: 04/27/2013  Findings: Heart size is normal.  There is diffuse parenchymal airspace disease, new since previous study.  This could be due to edema, pneumonia, or other infiltrative process.  Suggestion of small left pleural effusion.  No pneumothorax.  Calcification of the aorta.  Pulmonary vascularity is obscured by the parenchymal process.  Degenerative changes in the spine and shoulders.  IMPRESSION: Diffuse airspace infiltration in both lungs.   Original Report Authenticated By: Burman Nieves, M.D.   Labs: Basic Metabolic Panel:  Recent Labs Lab 08/16/13 1325 08/17/13 0800 08/18/13 0553 08/20/13 0551  NA 130* 137 140 144  K 3.9 3.4* 4.1 4.1  CL 97 105 110 114*  CO2 22 20 22 19   GLUCOSE 205* 91 125* 103*  BUN 43* 34* 27* 15  CREATININE 1.80* 1.47* 1.36* 1.29  CALCIUM 9.6 9.3 9.6 9.2   Liver Function Tests:  Recent Labs Lab 08/16/13 1325  AST 19  ALT 12  ALKPHOS 63  BILITOT <0.1*  PROT 6.8  ALBUMIN 3.1*   CBC:  Recent Labs Lab 08/16/13 1325  08/17/13 0800 08/17/13 1600 08/18/13 0553 08/19/13 0357 08/20/13 0551  WBC 7.3  --  7.1 6.7 7.5 6.9 5.7  NEUTROABS 5.9  --   --   --   --   --   --   HGB 6.3*  < > 8.3* 8.2* 8.2* 8.1* 8.1*  HCT 19.1*  < > 24.3* 24.3* 24.1* 24.1* 24.3*  MCV 87.2  --  85.3 85.9 86.1 87.0 87.4  PLT 287  --  257 275 292 318 303  < > = values in this interval not displayed. Cardiac Enzymes:  Recent Labs Lab 08/16/13 1325 08/17/13 0800  TROPONINI <0.30 0.57*   BNP: BNP (last 3 results)  Recent Labs  08/06/13 0321  PROBNP 222.8   CBG:  Recent Labs Lab 08/19/13 1751 08/19/13 2222 08/20/13 0856 08/20/13 2141 08/21/13 0734  GLUCAP 139* 109* 112* 208* 113*    Signed:  Joycelyn Man, PA-S  [facility] 08/21/2013, 10:32 AM

## 2013-08-21 NOTE — Progress Notes (Signed)
Patient discharge teaching given, including activity, diet, follow-up appoints, and medications. Paatient verbalized understanding of all discharge instruction; IV access was d'cd. Vitals are stable. Skin is intact except as charted in most recent assessments. Pt to be escorted out by volunteer, to be driven home by family friend.

## 2013-08-21 NOTE — Care Management Note (Signed)
    Page 1 of 1   08/21/2013     3:49:22 PM   CARE MANAGEMENT NOTE 08/21/2013  Patient:  [name], [name]   Account Number:  [id number]  Date Initiated:  08/21/2013  Documentation initiated by:  Letha Cape  Subjective/Objective Assessment:   dx gib  admit-from home., pta indep.     Action/Plan:   pt eval- no pt f/u(patient declines)   Anticipated DC Date:  08/21/2013   Anticipated DC Plan:  HOME/SELF CARE      DC Planning Services  CM consult      Choice offered to / List presented to:             Status of service:  Completed, signed off Medicare Important Message given?   (If response is "NO", the following Medicare IM given date fields will be blank) Date Medicare IM given:   Date Additional Medicare IM given:    Discharge Disposition:  HOME/SELF CARE  Per UR Regulation:  Reviewed for med. necessity/level of care/duration of stay  If discussed at Long Length of Stay Meetings, dates discussed:    Comments:  08/21/13 15:46 Letha Cape RN, BSN [phone] patient is from home, patient is for dc today, pta indep. Per physical therapy recs no pt f/u ( patient declines pt). Patient has medication coverage and transposrtation.

## 2013-08-21 NOTE — Progress Notes (Signed)
Subjective: No complaints.  Objective: Vital signs in last 24 hours: Temp:  [98.1 F (36.7 C)-98.4 F (36.9 C)] 98.4 F (36.9 C) (09/25 0529) Pulse Rate:  [64-81] 74 (09/25 1037) Resp:  [18-20] 18 (09/25 0529) BP: (144-156)/(47-65) 151/50 mmHg (09/25 1037) SpO2:  [95 %-100 %] 96 % (09/25 0529) Last BM Date: 08/19/13  Intake/Output from previous day: 09/24 0701 - 09/25 0700 In: 3715 [P.O.:2530; I.V.:1185] Out: 275 [Urine:275] Intake/Output this shift:    General appearance: alert and no distress GI: soft, non-tender; bowel sounds normal; no masses,  no organomegaly  Lab Results:  Recent Labs  08/19/13 0357 08/20/13 0551  WBC 6.9 5.7  HGB 8.1* 8.1*  HCT 24.1* 24.3*  PLT 318 303   BMET  Recent Labs  08/20/13 0551  NA 144  K 4.1  CL 114*  CO2 19  GLUCOSE 103*  BUN 15  CREATININE 1.29  CALCIUM 9.2   LFT No results found for this basename: PROT, ALBUMIN, AST, ALT, ALKPHOS, BILITOT, BILIDIR, IBILI,  in the last 72 hours PT/INR No results found for this basename: LABPROT, INR,  in the last 72 hours Hepatitis Panel No results found for this basename: HEPBSAG, HCVAB, HEPAIGM, HEPBIGM,  in the last 72 hours C-Diff No results found for this basename: CDIFFTOX,  in the last 72 hours Fecal Lactopherrin No results found for this basename: FECLLACTOFRN,  in the last 72 hours  Studies/Results: No results found.  Medications:  Scheduled: . aspirin EC  81 mg Oral Daily  . atorvastatin  40 mg Oral q1800  . hydrALAZINE  50 mg Oral TID  . insulin aspart  0-9 Units Subcutaneous TID WC  . metoprolol  25 mg Oral BID  . pantoprazole  40 mg Oral BID  . sodium chloride  3 mL Intravenous Q12H   Continuous: . sodium chloride 50 mL/hr at 08/21/13 0418    Assessment/Plan: 1) Gastric AVMs. 2) CAD with recent MI.   The patient is clinically stable.  I hope the ablation of the gastric AVMs will prevent any further bleeding.  Also, it will be good, from the GI  standpoint, if he is not on Plavix.    Plan: 1) Follow HGB. 2) Transfuse if necessary. 3) Follow up with Dr. Darrick Penna, his GI physician, upon discharge. 4) Signing off.   LOS: 5 days   Fabiola Mudgett D 08/21/2013, 12:49 PM

## 2013-08-25 ENCOUNTER — Encounter: Payer: Medicare Other | Admitting: Adult Health

## 2013-08-25 ENCOUNTER — Encounter: Payer: Self-pay | Admitting: Adult Health

## 2013-08-25 NOTE — Progress Notes (Signed)
HPI: Mr. Bagshaw is a 79 year old patient of Dr. Beulah Gandy we are following for ongoing assessment and management of chronic diastolic CHF, Nordic valve stenosis, hypertension, with recent non-ST elevation MI in the setting of GI bleed, hospitalized at [facility] on 08/16/2013. The patient was treated for acute blood loss anemia with blood transfusions, EGD was completed, revealing nonbleeding AVMs, Plavix and aspirin were discontinued. No invasive testing and conservative management was recommended during hospitalization.    Echocardiogram was completed:08/16/2013  Left ventricle: The cavity size was at the lower limits of normal. Wall thickness was increased increased in a pattern of mild to moderate LVH. Systolic function was normal. The estimated ejection fraction was in the range of 50% to 55%. Wall motion was normal; there were no regional wall motion abnormalities. Diastolic function is indeterminate, there is evidence of elevated left atrial pressure. - Aortic valve: Mildly calcified annulus. Trileaflet; severely thickened leaflets. The left coronary cusps is heavily calcified with severely restricted movement. There was moderate to severe stenosis. Majority of parameters meet criteria for severe stenosis, gradients likely moderate due to Doppler alignment. Morphologically and by continuity equation the stenosis appears severe. Mild regurgitation. Valve area: 0.78cm^2(VTI). Valve area: 0.73cm^2 (Vmax). - Mitral valve: Mildly calcified annulus. Mildly thickened leaflets . Mild regurgitation. - Left atrium: The atrium was moderately dilated. - Pulmonary arteries: Systolic pressure was moderately    No Known Allergies  Current Outpatient Prescriptions  Medication Sig Dispense Refill  . allopurinol (ZYLOPRIM) 100 MG tablet Take 100 mg by mouth daily.        Marland Kitchen amLODipine (NORVASC) 5 MG tablet Take 5 mg by mouth daily.      Marland Kitchen atorvastatin (LIPITOR) 40 MG tablet Take 1 tablet (40  mg total) by mouth daily at 6 PM.  30 tablet  1  . furosemide (LASIX) 20 MG tablet Take 1 tablet (20 mg total) by mouth daily.  30 tablet  1  . hydrALAZINE (APRESOLINE) 50 MG tablet Take 1 tablet (50 mg total) by mouth 3 (three) times daily.  90 tablet  1  . insulin glargine (LANTUS) 100 UNIT/ML injection Inject 0.24 mLs (24 Units total) into the skin every morning. Inject 23 units if blood sugars are below 150 or inject 28 units if blood sugars are above 150  10 mL  12  . metoprolol (LOPRESSOR) 50 MG tablet Take 1 tablet (50 mg total) by mouth 2 (two) times daily.  60 tablet  1  . pantoprazole (PROTONIX) 40 MG tablet Take 1 tablet (40 mg total) by mouth 2 (two) times daily.  60 tablet  2   No current facility-administered medications for this visit.    Past Medical History  Diagnosis Date  . Diabetes mellitus   . Hypertension   . Arthritis   . Prostate cancer   . Gout   . Coronary artery disease     a. presumed, s/p NSTEMI 07/2013-->Med Rx  . GERD (gastroesophageal reflux disease)   . CKD (chronic kidney disease), stage III   . Iron deficiency anemia     a. 07/2013 s/p multiple PRBC's.  . Heme positive stool   . Moderate to severe aortic stenosis     a.  07/2013 Echo: EF50-55%, no rwma, mod-sev AS (0.78cm^2 VTI, 0.73cm^2 Vmax), mild AI, Mild MR, mod dil LA, PASP    Past Surgical History  Procedure Laterality Date  . Prostate surgery    . Umbilical hernia repair    . Carotid endarterectomy    .  Esophagogastroduodenoscopy N/A 08/20/2013    Procedure: ESOPHAGOGASTRODUODENOSCOPY (EGD);  Surgeon: Theda Belfast, MD;  Location: [facility] ENDOSCOPY;  Service: Endoscopy;  Laterality: N/A;    ROS: PHYSICAL EXAM There were no vitals taken for this visit.  EKG:  ASSESSMENT AND PLAN

## 2013-08-28 ENCOUNTER — Encounter: Payer: Self-pay | Admitting: Gastroenterology

## 2013-09-02 ENCOUNTER — Telehealth: Payer: Self-pay | Admitting: Gastroenterology

## 2013-09-02 ENCOUNTER — Inpatient Hospital Stay: Payer: Medicare Other | Admitting: Gastroenterology

## 2013-09-02 NOTE — Telephone Encounter (Signed)
Pt was a no show

## 2013-09-09 ENCOUNTER — Encounter: Payer: Medicare Other | Admitting: Adult Health

## 2013-09-09 NOTE — Progress Notes (Signed)
    HPI: [name] is a 79 year old patient of Dr. Beulah Gandy we are following for ongoing assessment and management of retention, CAD, with presumed non-ST elevation MI in the setting of a GI bleed in September of 2014. Patient has a history of chronic kidney disease stage III and iron deficiency anemia. He was given blood transfusions during hospitalization. He was also noted to have moderate to severe aortic valve stenosis with EF of 50-55%. Due to GI bleed, aspirin and  Plavix was discontinued. He was continued on antihypertensive medicines to include amlodipine, Lasix, hydralazine, and metoprolol.  No Known Allergies  Current Outpatient Prescriptions  Medication Sig Dispense Refill  . allopurinol (ZYLOPRIM) 100 MG tablet Take 100 mg by mouth daily.        Marland Kitchen amLODipine (NORVASC) 5 MG tablet Take 5 mg by mouth daily.      Marland Kitchen atorvastatin (LIPITOR) 40 MG tablet Take 1 tablet (40 mg total) by mouth daily at 6 PM.  30 tablet  1  . furosemide (LASIX) 20 MG tablet Take 1 tablet (20 mg total) by mouth daily.  30 tablet  1  . hydrALAZINE (APRESOLINE) 50 MG tablet Take 1 tablet (50 mg total) by mouth 3 (three) times daily.  90 tablet  1  . insulin glargine (LANTUS) 100 UNIT/ML injection Inject 0.24 mLs (24 Units total) into the skin every morning. Inject 23 units if blood sugars are below 150 or inject 28 units if blood sugars are above 150  10 mL  12  . metoprolol (LOPRESSOR) 50 MG tablet Take 1 tablet (50 mg total) by mouth 2 (two) times daily.  60 tablet  1  . pantoprazole (PROTONIX) 40 MG tablet Take 1 tablet (40 mg total) by mouth 2 (two) times daily.  60 tablet  2   No current facility-administered medications for this visit.    Past Medical History  Diagnosis Date  . Diabetes mellitus   . Hypertension   . Arthritis   . Prostate cancer   . Gout   . Coronary artery disease     a. presumed, s/p NSTEMI 07/2013-->Med Rx  . GERD (gastroesophageal reflux disease)   . CKD (chronic kidney  disease), stage III   . Iron deficiency anemia     a. 07/2013 s/p multiple PRBC's.  . Heme positive stool   . Moderate to severe aortic stenosis     a.  07/2013 Echo: EF50-55%, no rwma, mod-sev AS (0.78cm^2 VTI, 0.73cm^2 Vmax), mild AI, Mild MR, mod dil LA, PASP    Past Surgical History  Procedure Laterality Date  . Prostate surgery    . Umbilical hernia repair    . Carotid endarterectomy    . Esophagogastroduodenoscopy N/A 08/20/2013    Hung:Multiple nonbleeding gastric AVMs s/p APC.    ROS: PHYSICAL EXAM There were no vitals taken for this visit.  EKG:  ASSESSMENT AND PLAN

## 2013-09-10 ENCOUNTER — Encounter (HOSPITAL_COMMUNITY): Payer: Self-pay | Admitting: Emergency Medicine

## 2013-09-10 ENCOUNTER — Emergency Department (HOSPITAL_COMMUNITY): Payer: Medicare Other

## 2013-09-10 ENCOUNTER — Inpatient Hospital Stay (HOSPITAL_COMMUNITY)
Admission: EM | Admit: 2013-09-10 | Discharge: 2013-09-15 | DRG: 189 | Disposition: A | Discharge status: Home / Self Care | Payer: Medicare Other | Attending: Cardiology | Admitting: Cardiology

## 2013-09-10 DIAGNOSIS — N183 Chronic kidney disease, stage 3 unspecified: Secondary | ICD-10-CM | POA: Diagnosis present

## 2013-09-10 DIAGNOSIS — I129 Hypertensive chronic kidney disease with stage 1 through stage 4 chronic kidney disease, or unspecified chronic kidney disease: Secondary | ICD-10-CM | POA: Diagnosis present

## 2013-09-10 DIAGNOSIS — D649 Anemia, unspecified: Secondary | ICD-10-CM

## 2013-09-10 DIAGNOSIS — E1165 Type 2 diabetes mellitus with hyperglycemia: Secondary | ICD-10-CM | POA: Diagnosis present

## 2013-09-10 DIAGNOSIS — I252 Old myocardial infarction: Secondary | ICD-10-CM

## 2013-09-10 DIAGNOSIS — I2510 Atherosclerotic heart disease of native coronary artery without angina pectoris: Secondary | ICD-10-CM | POA: Diagnosis present

## 2013-09-10 DIAGNOSIS — E1129 Type 2 diabetes mellitus with other diabetic kidney complication: Secondary | ICD-10-CM | POA: Diagnosis present

## 2013-09-10 DIAGNOSIS — I5031 Acute diastolic (congestive) heart failure: Secondary | ICD-10-CM

## 2013-09-10 DIAGNOSIS — I5033 Acute on chronic diastolic (congestive) heart failure: Secondary | ICD-10-CM | POA: Diagnosis present

## 2013-09-10 DIAGNOSIS — K219 Gastro-esophageal reflux disease without esophagitis: Secondary | ICD-10-CM | POA: Diagnosis present

## 2013-09-10 DIAGNOSIS — Z794 Long term (current) use of insulin: Secondary | ICD-10-CM

## 2013-09-10 DIAGNOSIS — Z87891 Personal history of nicotine dependence: Secondary | ICD-10-CM

## 2013-09-10 DIAGNOSIS — J9620 Acute and chronic respiratory failure, unspecified whether with hypoxia or hypercapnia: Principal | ICD-10-CM | POA: Diagnosis present

## 2013-09-10 DIAGNOSIS — E876 Hypokalemia: Secondary | ICD-10-CM | POA: Diagnosis not present

## 2013-09-10 DIAGNOSIS — E43 Unspecified severe protein-calorie malnutrition: Secondary | ICD-10-CM | POA: Diagnosis present

## 2013-09-10 DIAGNOSIS — Z8546 Personal history of malignant neoplasm of prostate: Secondary | ICD-10-CM

## 2013-09-10 DIAGNOSIS — D509 Iron deficiency anemia, unspecified: Secondary | ICD-10-CM | POA: Diagnosis present

## 2013-09-10 DIAGNOSIS — N058 Unspecified nephritic syndrome with other morphologic changes: Secondary | ICD-10-CM | POA: Diagnosis present

## 2013-09-10 DIAGNOSIS — I359 Nonrheumatic aortic valve disorder, unspecified: Secondary | ICD-10-CM | POA: Diagnosis present

## 2013-09-10 DIAGNOSIS — I350 Nonrheumatic aortic (valve) stenosis: Secondary | ICD-10-CM

## 2013-09-10 DIAGNOSIS — I509 Heart failure, unspecified: Secondary | ICD-10-CM

## 2013-09-10 DIAGNOSIS — Z8774 Personal history of (corrected) congenital malformations of heart and circulatory system: Secondary | ICD-10-CM

## 2013-09-10 LAB — CBC WITH DIFFERENTIAL/PLATELET
Basophils Absolute: 0 10*3/uL (ref 0.0–0.1)
Basophils Relative: 0 % (ref 0–1)
Eosinophils Relative: 1 % (ref 0–5)
HCT: 24.7 % — ABNORMAL LOW (ref 39.0–52.0)
Lymphocytes Relative: 19 % (ref 12–46)
MCHC: 31.2 g/dL (ref 30.0–36.0)
MCV: 83.2 fL (ref 78.0–100.0)
Monocytes Absolute: 0.5 10*3/uL (ref 0.1–1.0)
Neutrophils Relative %: 74 % (ref 43–77)
Platelets: 272 10*3/uL (ref 150–400)
RDW: 17 % — ABNORMAL HIGH (ref 11.5–15.5)
WBC: 9.8 10*3/uL (ref 4.0–10.5)

## 2013-09-10 LAB — POCT I-STAT 3, ART BLOOD GAS (G3+)
Acid-base deficit: 8 mmol/L — ABNORMAL HIGH (ref 0.0–2.0)
Patient temperature: 98.6
pCO2 arterial: 32.9 mmHg — ABNORMAL LOW (ref 35.0–45.0)
pH, Arterial: 7.333 — ABNORMAL LOW (ref 7.350–7.450)

## 2013-09-10 LAB — POCT I-STAT, CHEM 8
Calcium, Ion: 1.2 mmol/L (ref 1.13–1.30)
Chloride: 104 mEq/L (ref 96–112)
Glucose, Bld: 324 mg/dL — ABNORMAL HIGH (ref 70–99)
HCT: 27 % — ABNORMAL LOW (ref 39.0–52.0)
Hemoglobin: 9.2 g/dL — ABNORMAL LOW (ref 13.0–17.0)
TCO2: 16 mmol/L (ref 0–100)

## 2013-09-10 LAB — POCT I-STAT TROPONIN I: Troponin i, poc: 0.02 ng/mL (ref 0.00–0.08)

## 2013-09-10 MED ORDER — FUROSEMIDE 10 MG/ML IJ SOLN
INTRAMUSCULAR | Status: AC
  Filled 2013-09-10: qty 4

## 2013-09-10 MED ORDER — ASPIRIN 300 MG RE SUPP
300.0000 mg | Freq: Once | RECTAL | Status: AC
  Administered 2013-09-10: 300 mg via RECTAL
  Filled 2013-09-10: qty 1

## 2013-09-10 MED ORDER — FUROSEMIDE 10 MG/ML IJ SOLN
40.0000 mg | Freq: Once | INTRAMUSCULAR | Status: AC
  Administered 2013-09-10: 40 mg via INTRAVENOUS

## 2013-09-10 MED ORDER — ALBUTEROL SULFATE (5 MG/ML) 0.5% IN NEBU
2.5000 mg | INHALATION_SOLUTION | Freq: Once | RESPIRATORY_TRACT | Status: AC
  Administered 2013-09-10: 2.5 mg via RESPIRATORY_TRACT
  Filled 2013-09-10: qty 0.5

## 2013-09-10 MED ORDER — NITROGLYCERIN 2 % TD OINT
1.0000 [in_us] | TOPICAL_OINTMENT | Freq: Once | TRANSDERMAL | Status: AC
  Administered 2013-09-10: 1 [in_us] via TOPICAL
  Filled 2013-09-10: qty 1

## 2013-09-10 NOTE — ED Notes (Signed)
Patient presents from home with SOB, EMS arrived and sats 90%, EMS placed non-rebreather mask without improvement, patient had labored respirations, EMS placed CPAP. Patient alert and orientedx3

## 2013-09-10 NOTE — ED Provider Notes (Signed)
CSN: [id number]     Arrival date & time 09/10/13  2307 History   First MD Initiated Contact with Patient 09/10/13 2322     Chief Complaint  Patient presents with  . Shortness of Breath   (Consider location/radiation/quality/duration/timing/severity/associated sxs/prior Treatment) Patient is a 79 y.o. male presenting with shortness of breath. The history is provided by the EMS personnel. The history is limited by the condition of the patient. No language interpreter was used.  Shortness of Breath Severity:  Severe Onset quality:  Unable to specify Timing:  Constant Progression:  Unchanged Chronicity:  Recurrent Relieved by:  Nothing Worsened by:  Nothing tried Ineffective treatments:  None tried Risk factors: no recent surgery     Past Medical History  Diagnosis Date  . Diabetes mellitus   . Hypertension   . Arthritis   . Prostate cancer   . Gout   . Coronary artery disease     a. presumed, s/p NSTEMI 07/2013-->Med Rx  . GERD (gastroesophageal reflux disease)   . CKD (chronic kidney disease), stage III   . Iron deficiency anemia     a. 07/2013 s/p multiple PRBC's.  . Heme positive stool   . Moderate to severe aortic stenosis     a.  07/2013 Echo: EF50-55%, no rwma, mod-sev AS (0.78cm^2 VTI, 0.73cm^2 Vmax), mild AI, Mild MR, mod dil LA, PASP   Past Surgical History  Procedure Laterality Date  . Prostate surgery    . Umbilical hernia repair    . Carotid endarterectomy    . Esophagogastroduodenoscopy N/A 08/20/2013    Hung:Multiple nonbleeding gastric AVMs s/p APC.   Family History  Problem Relation Age of Onset  . Cancer Mother     Deceased  . Heart failure Sister     Deceased  . Heart failure Brother     Deceased  . Hypertension Mother     Deceased  . Hypertension Father    History  Substance Use Topics  . Smoking status: Former Games developer  . Smokeless tobacco: Not on file  . Alcohol Use: No    Review of Systems  Unable to perform ROS Respiratory:  Positive for shortness of breath.     Allergies  Review of patient's allergies indicates no known allergies.  Home Medications   Current Outpatient Rx  Name  Route  Sig  Dispense  Refill  . allopurinol (ZYLOPRIM) 100 MG tablet   Oral   Take 100 mg by mouth daily.           Marland Kitchen amLODipine (NORVASC) 5 MG tablet   Oral   Take 5 mg by mouth daily.         Marland Kitchen atorvastatin (LIPITOR) 40 MG tablet   Oral   Take 1 tablet (40 mg total) by mouth daily at 6 PM.   30 tablet   1   . furosemide (LASIX) 20 MG tablet   Oral   Take 1 tablet (20 mg total) by mouth daily.   30 tablet   1   . hydrALAZINE (APRESOLINE) 50 MG tablet   Oral   Take 1 tablet (50 mg total) by mouth 3 (three) times daily.   90 tablet   1   . insulin glargine (LANTUS) 100 UNIT/ML injection   Subcutaneous   Inject 0.24 mLs (24 Units total) into the skin every morning. Inject 23 units if blood sugars are below 150 or inject 28 units if blood sugars are above 150   10 mL  12   . metoprolol (LOPRESSOR) 50 MG tablet   Oral   Take 1 tablet (50 mg total) by mouth 2 (two) times daily.   60 tablet   1   . pantoprazole (PROTONIX) 40 MG tablet   Oral   Take 1 tablet (40 mg total) by mouth 2 (two) times daily.   60 tablet   2    SpO2 90% Physical Exam  Constitutional: He appears well-developed and well-nourished.  HENT:  Head: Normocephalic and atraumatic.  Eyes: Conjunctivae are normal. Pupils are equal, round, and reactive to light.  Neck: Normal range of motion. Neck supple.  Cardiovascular: Normal rate, regular rhythm and intact distal pulses.   Pulmonary/Chest: No stridor. He has rales.  Abdominal: Soft. Bowel sounds are normal. There is no tenderness. There is no rebound and no guarding.  Musculoskeletal: Normal range of motion. He exhibits no edema.  Neurological: He is alert. He has normal reflexes.  Skin: Skin is warm and dry. He is not diaphoretic.  Psychiatric: He has a normal mood and affect.     ED Course  Procedures (including critical care time) Labs Review Labs Reviewed  CBC WITH DIFFERENTIAL  PRO B NATRIURETIC PEPTIDE   Imaging Review No results found.  EKG Interpretation   None       MDM  No diagnosis found.  Date: 09/10/2013  Rate: 97  Rhythm: normal sinus rhythm  QRS Axis: normal  Intervals: QT prolonged  ST/T Wave abnormalities: nonspecific ST changes  Conduction Disutrbances:none  Narrative Interpretation:   Old EKG Reviewed: changes noted    Medications  allopurinol (ZYLOPRIM) tablet 100 mg (not administered)  amLODipine (NORVASC) tablet 5 mg (not administered)  atorvastatin (LIPITOR) tablet 40 mg (not administered)  hydrALAZINE (APRESOLINE) tablet 50 mg (not administered)  insulin glargine (LANTUS) injection 24 Units (not administered)  metoprolol (LOPRESSOR) tablet 50 mg (not administered)  pantoprazole (PROTONIX) EC tablet 40 mg (not administered)  sodium chloride 0.9 % injection 3 mL (not administered)  sodium chloride 0.9 % injection 3 mL (not administered)  0.9 %  sodium chloride infusion (not administered)  acetaminophen (TYLENOL) tablet 650 mg (not administered)  ondansetron (ZOFRAN) injection 4 mg (not administered)  heparin injection 5,000 Units (not administered)  furosemide (LASIX) injection 40 mg (not administered)  insulin aspart (novoLOG) injection 0-9 Units (not administered)  insulin aspart (novoLOG) injection 0-5 Units (not administered)  insulin aspart (novoLOG) injection 3 Units (not administered)  furosemide (LASIX) injection 40 mg (40 mg Intravenous Given 09/10/13 2333)  albuterol (PROVENTIL) (5 MG/ML) 0.5% nebulizer solution 2.5 mg (2.5 mg Nebulization Given 09/10/13 2341)  aspirin suppository 300 mg (300 mg Rectal Given 09/10/13 2337)  nitroGLYCERIN (NITROGLYN) 2 % ointment 1 inch (1 inch Topical Given 09/10/13 2344)  furosemide (LASIX) injection 20 mg (20 mg Intravenous Given 09/11/13 0205)   Results for orders  placed during the hospital encounter of 09/10/13  CBC WITH DIFFERENTIAL      Result Value Range   WBC 9.8  4.0 - 10.5 K/uL   RBC 2.97 (*) 4.22 - 5.81 MIL/uL   Hemoglobin 7.7 (*) 13.0 - 17.0 g/dL   HCT 16.1 (*) 09.6 - 04.5 %   MCV 83.2  78.0 - 100.0 fL   MCH 25.9 (*) 26.0 - 34.0 pg   MCHC 31.2  30.0 - 36.0 g/dL   RDW 40.9 (*) 81.1 - 91.4 %   Platelets 272  150 - 400 K/uL   Neutrophils Relative % 74  43 - 77 %  Neutro Abs 7.3  1.7 - 7.7 K/uL   Lymphocytes Relative 19  12 - 46 %   Lymphs Abs 1.9  0.7 - 4.0 K/uL   Monocytes Relative 5  3 - 12 %   Monocytes Absolute 0.5  0.1 - 1.0 K/uL   Eosinophils Relative 1  0 - 5 %   Eosinophils Absolute 0.1  0.0 - 0.7 K/uL   Basophils Relative 0  0 - 1 %   Basophils Absolute 0.0  0.0 - 0.1 K/uL  PRO B NATRIURETIC PEPTIDE      Result Value Range   Pro B Natriuretic peptide (BNP) 1262.0 (*) 0 - 450 pg/mL  POCT I-STAT, CHEM 8      Result Value Range   Sodium 134 (*) 135 - 145 mEq/L   Potassium 3.8  3.5 - 5.1 mEq/L   Chloride 104  96 - 112 mEq/L   BUN 18  6 - 23 mg/dL   Creatinine, Ser 4.54 (*) 0.50 - 1.35 mg/dL   Glucose, Bld 098 (*) 70 - 99 mg/dL   Calcium, Ion 1.19  1.47 - 1.30 mmol/L   TCO2 16  0 - 100 mmol/L   Hemoglobin 9.2 (*) 13.0 - 17.0 g/dL   HCT 82.9 (*) 56.2 - 13.0 %  POCT I-STAT 3, BLOOD GAS (G3+)      Result Value Range   pH, Arterial 7.333 (*) 7.350 - 7.450   pCO2 arterial 32.9 (*) 35.0 - 45.0 mmHg   pO2, Arterial 116.0 (*) 80.0 - 100.0 mmHg   Bicarbonate 17.5 (*) 20.0 - 24.0 mEq/L   TCO2 18  0 - 100 mmol/L   O2 Saturation 98.0     Acid-base deficit 8.0 (*) 0.0 - 2.0 mmol/L   Patient temperature 98.6 F     Collection site RADIAL, ALLEN'S TEST ACCEPTABLE     Drawn by RT     Sample type ARTERIAL    POCT I-STAT TROPONIN I      Result Value Range   Troponin i, poc 0.02  0.00 - 0.08 ng/mL   Comment 3            Dg Chest Portable 1 View  09/11/2013   CLINICAL DATA:  Respiratory distress and shortness of breath  EXAM:  PORTABLE CHEST - 1 VIEW  COMPARISON:  08/16/2013  FINDINGS: Diffuse interstitial and airspace opacities. There is some sparing in the right mid lung - no emphysematous change in this region on chest CT 08/06/2012. Probable small pleural effusions. No cardiomegaly. Aortic atherosclerosis. No pneumothorax.  IMPRESSION: Pulmonary edema with pleural effusions. Sparing in the right mid lung of uncertain cause or significance.   Electronically Signed   By: Tiburcio Pea M.D.   On: 09/11/2013 00:27   Dg Chest Portable 1 View  08/16/2013   *RADIOLOGY REPORT*  Clinical Data: Shortness of breath  PORTABLE CHEST - 1 VIEW  Comparison: August 07, 2013, April 27, 2013  Findings: There is no focal infiltrate, pulmonary edema, or pleural effusion.  The mediastinal contour and cardiac silhouette are normal.  The soft tissues and osseous structures are stable.  IMPRESSION: No acute cardiopulmonary disease identified.   Original Report Authenticated By: Sherian Rein, M.D.    MDM Reviewed: previous chart, nursing note and vitals Reviewed previous: ECG Interpretation: ECG, labs and x-ray Total time providing critical care: 75-105 minutes. This excludes time spent performing separately reportable procedures and services. Consults: cardiology  CRITICAL CARE Performed by: Jasmine Awe Total critical care time: 90  minutes Critical care time was exclusive of separately billable procedures and treating other patients. Critical care was necessary to treat or prevent imminent or life-threatening deterioration. Critical care was time spent personally by me on the following activities: development of treatment plan with patient and/or surrogate as well as nursing, discussions with consultants, evaluation of patient's response to treatment, examination of patient, obtaining history from patient or surrogate, ordering and performing treatments and interventions, ordering and review of laboratory studies, ordering and  review of radiographic studies, pulse oximetry and re-evaluation of patient's condition.    Jasmine Awe, MD 09/11/13 [phone]

## 2013-09-11 DIAGNOSIS — I5032 Chronic diastolic (congestive) heart failure: Secondary | ICD-10-CM

## 2013-09-11 DIAGNOSIS — J9600 Acute respiratory failure, unspecified whether with hypoxia or hypercapnia: Secondary | ICD-10-CM

## 2013-09-11 DIAGNOSIS — I359 Nonrheumatic aortic valve disorder, unspecified: Secondary | ICD-10-CM

## 2013-09-11 DIAGNOSIS — I509 Heart failure, unspecified: Secondary | ICD-10-CM

## 2013-09-11 DIAGNOSIS — I5033 Acute on chronic diastolic (congestive) heart failure: Secondary | ICD-10-CM | POA: Diagnosis present

## 2013-09-11 LAB — GLUCOSE, CAPILLARY
Glucose-Capillary: 151 mg/dL — ABNORMAL HIGH (ref 70–99)
Glucose-Capillary: 141 mg/dL — ABNORMAL HIGH (ref 70–99)
Glucose-Capillary: 136 mg/dL — ABNORMAL HIGH (ref 70–99)
Glucose-Capillary: 128 mg/dL — ABNORMAL HIGH (ref 70–99)

## 2013-09-11 LAB — MAGNESIUM: Magnesium: 1.7 mg/dL (ref 1.5–2.5)

## 2013-09-11 LAB — TROPONIN I: Troponin I: <0.3 ng/mL (ref <0.30)

## 2013-09-11 LAB — PRO B NATRIURETIC PEPTIDE: Pro B Natriuretic peptide (BNP): 1262 pg/mL — ABNORMAL HIGH (ref 0–450)

## 2013-09-11 LAB — MRSA PCR SCREENING: MRSA by PCR: NEGATIVE

## 2013-09-11 MED ORDER — ALLOPURINOL 100 MG PO TABS
100.0000 mg | ORAL_TABLET | Freq: Every day | ORAL | Status: DC
  Administered 2013-09-11 – 2013-09-15 (×5): 100 mg via ORAL
  Filled 2013-09-11 (×5): qty 1

## 2013-09-11 MED ORDER — AMLODIPINE BESYLATE 5 MG PO TABS
5.0000 mg | ORAL_TABLET | Freq: Every day | ORAL | Status: DC
  Filled 2013-09-11: qty 1

## 2013-09-11 MED ORDER — HEPARIN SODIUM (PORCINE) 5000 UNIT/ML IJ SOLN
5000.0000 [IU] | Freq: Three times a day (TID) | INTRAMUSCULAR | Status: DC
  Administered 2013-09-11 – 2013-09-15 (×13): 5000 [IU] via SUBCUTANEOUS
  Filled 2013-09-11 (×16): qty 1

## 2013-09-11 MED ORDER — HYDRALAZINE HCL 20 MG/ML IJ SOLN
10.0000 mg | Freq: Once | INTRAMUSCULAR | Status: AC
  Administered 2013-09-11: 10 mg via INTRAVENOUS
  Filled 2013-09-11: qty 1

## 2013-09-11 MED ORDER — INSULIN ASPART 100 UNIT/ML ~~LOC~~ SOLN
0.0000 [IU] | Freq: Three times a day (TID) | SUBCUTANEOUS | Status: DC
  Administered 2013-09-11 (×2): 1 [IU] via SUBCUTANEOUS
  Administered 2013-09-11: 2 [IU] via SUBCUTANEOUS
  Administered 2013-09-12: 3 [IU] via SUBCUTANEOUS
  Administered 2013-09-13: 5 [IU] via SUBCUTANEOUS
  Administered 2013-09-14: 2 [IU] via SUBCUTANEOUS
  Administered 2013-09-14: 1 [IU] via SUBCUTANEOUS
  Administered 2013-09-15: 12:00:00 2 [IU] via SUBCUTANEOUS
  Administered 2013-09-15: 08:00:00 1 [IU] via SUBCUTANEOUS

## 2013-09-11 MED ORDER — SODIUM CHLORIDE 0.9 % IJ SOLN
3.0000 mL | Freq: Two times a day (BID) | INTRAMUSCULAR | Status: DC
  Administered 2013-09-11: 04:00:00 via INTRAVENOUS
  Administered 2013-09-11 – 2013-09-15 (×8): 3 mL via INTRAVENOUS

## 2013-09-11 MED ORDER — INSULIN ASPART 100 UNIT/ML ~~LOC~~ SOLN
3.0000 [IU] | Freq: Three times a day (TID) | SUBCUTANEOUS | Status: DC
  Administered 2013-09-11 – 2013-09-15 (×11): 3 [IU] via SUBCUTANEOUS

## 2013-09-11 MED ORDER — SODIUM CHLORIDE 0.9 % IV SOLN: 250.0000 mL | INTRAVENOUS | Status: DC | PRN

## 2013-09-11 MED ORDER — SODIUM CHLORIDE 0.9 % IJ SOLN
3.0000 mL | INTRAMUSCULAR | Status: DC | PRN
  Administered 2013-09-14: 10:00:00 3 mL via INTRAVENOUS

## 2013-09-11 MED ORDER — HYDRALAZINE HCL 50 MG PO TABS
50.0000 mg | ORAL_TABLET | Freq: Three times a day (TID) | ORAL | Status: DC
  Administered 2013-09-11 – 2013-09-15 (×14): 50 mg via ORAL
  Filled 2013-09-11 (×16): qty 1

## 2013-09-11 MED ORDER — ACETAMINOPHEN 325 MG PO TABS: 650.0000 mg | ORAL_TABLET | ORAL | Status: DC | PRN

## 2013-09-11 MED ORDER — HYDRALAZINE HCL 20 MG/ML IJ SOLN: 10.0000 mg | Freq: Four times a day (QID) | INTRAMUSCULAR | Status: DC | PRN

## 2013-09-11 MED ORDER — ATORVASTATIN CALCIUM 40 MG PO TABS
40.0000 mg | ORAL_TABLET | Freq: Every day | ORAL | Status: DC
  Administered 2013-09-11 – 2013-09-14 (×4): 40 mg via ORAL
  Filled 2013-09-11 (×5): qty 1

## 2013-09-11 MED ORDER — FUROSEMIDE 10 MG/ML IJ SOLN
40.0000 mg | Freq: Two times a day (BID) | INTRAMUSCULAR | Status: DC
  Administered 2013-09-11 – 2013-09-13 (×5): 40 mg via INTRAVENOUS
  Filled 2013-09-11 (×7): qty 4

## 2013-09-11 MED ORDER — AMLODIPINE BESYLATE 10 MG PO TABS
10.0000 mg | ORAL_TABLET | Freq: Every day | ORAL | Status: DC
  Administered 2013-09-11 – 2013-09-15 (×5): 10 mg via ORAL
  Filled 2013-09-11 (×5): qty 1

## 2013-09-11 MED ORDER — FUROSEMIDE 10 MG/ML IJ SOLN
20.0000 mg | Freq: Once | INTRAMUSCULAR | Status: AC
  Administered 2013-09-11: 20 mg via INTRAVENOUS
  Filled 2013-09-11: qty 2

## 2013-09-11 MED ORDER — METOPROLOL TARTRATE 50 MG PO TABS
50.0000 mg | ORAL_TABLET | Freq: Two times a day (BID) | ORAL | Status: DC
  Administered 2013-09-11 – 2013-09-15 (×10): 50 mg via ORAL
  Filled 2013-09-11 (×11): qty 1

## 2013-09-11 MED ORDER — ONDANSETRON HCL 4 MG/2ML IJ SOLN: 4.0000 mg | Freq: Four times a day (QID) | INTRAMUSCULAR | Status: DC | PRN

## 2013-09-11 MED ORDER — INSULIN ASPART 100 UNIT/ML ~~LOC~~ SOLN: 0.0000 [IU] | Freq: Every day | SUBCUTANEOUS | Status: DC

## 2013-09-11 MED ORDER — PANTOPRAZOLE SODIUM 40 MG PO TBEC
40.0000 mg | DELAYED_RELEASE_TABLET | Freq: Two times a day (BID) | ORAL | Status: DC
  Administered 2013-09-11 – 2013-09-15 (×10): 40 mg via ORAL
  Filled 2013-09-11 (×10): qty 1

## 2013-09-11 MED ORDER — INSULIN GLARGINE 100 UNIT/ML ~~LOC~~ SOLN
24.0000 [IU] | Freq: Every morning | SUBCUTANEOUS | Status: DC
  Administered 2013-09-11 – 2013-09-15 (×5): 24 [IU] via SUBCUTANEOUS
  Filled 2013-09-11 (×5): qty 0.24

## 2013-09-11 NOTE — H&P (Signed)
Cardiology History and Physical  HILL,GERALD K, MD  History of Present Illness (and review of medical records): [name] is a 79 y.o. male who presents for evaluation of shortness of breath.  He has complex medical history and has had two recent admission in September.  He has had recent NSTEMI which was medically managed due to comorbdities included anemia and CKD.  He has also had anemia 2/2 GIB with workup revealing multiple AVMs and ASA/Plavix were held.  He now returns with shortness of breath that began acutely tonight.  Patient lives home alone and called EMS.  He denied any chest pain.  He reported compliance with his medications.  He denied any palpitations, presyncope or syncope.  He was placed on BiPAP by EMS and brought to ED for further evaluation.  Previous Studies: Study Conclusions - Left ventricle: The cavity size was at the lower limits of normal. Wall thickness was increased increased in a pattern of mild to moderate LVH. Systolic function was normal. The estimated ejection fraction was in the range of 50% to 55%. Wall motion was normal; there were no regional wall motion abnormalities. Diastolic function is indeterminate, there is evidence of elevated left atrial pressure. - Aortic valve: Mildly calcified annulus. Trileaflet; severely thickened leaflets. The left coronary cusps is heavily calcified with severely restricted movement. There was moderate to severe stenosis. Majority of parameters meet criteria for severe stenosis, gradients likely moderate due to Doppler alignment. Morphologically and by continuity equation the stenosis appears severe. Mild regurgitation. Valve area: 0.78cm^2(VTI). Valve area: 0.73cm^2 (Vmax). - Mitral valve: Mildly calcified annulus. Mildly thickened leaflets . Mild regurgitation. - Left atrium: The atrium was moderately dilated. - Pulmonary arteries: Systolic pressure was moderately increased, estimated to be 49mm  Hg.  Review of Systems No fevers, chills, night sweats. Further review of systems was otherwise negative other than stated in HPI.  Patient Active Problem List   Diagnosis Date Noted  . Elevated troponin 08/18/2013  . Acute blood loss anemia 08/16/2013  . Chronic diastolic CHF (congestive heart failure) 08/16/2013  . GI bleed 08/16/2013  . Iron deficiency anemia 08/16/2013  . History of non-ST elevation myocardial infarction (NSTEMI) 08/16/2013  . Rotavirus enteritis 08/07/2013  . NSTEMI (non-ST elevated myocardial infarction) 08/07/2013  . Acute respiratory failure 08/06/2013  . Acute diastolic CHF (congestive heart failure) 08/06/2013  . Metabolic acidosis 08/06/2013  . CKD (chronic kidney disease) stage 3, GFR 30-59 ml/min 09/06/2012  . GERD (gastroesophageal reflux disease) 09/06/2012  . Iron deficiency 09/06/2012  . DM (diabetes mellitus) type II uncontrolled with renal manifestation 09/06/2012  . Aortic stenosis 08/07/2012  . UTI (urinary tract infection) 08/04/2012  . Dehydration 08/04/2012  . Sepsis 08/04/2012  . MI, acute, non ST segment elevation 08/04/2012  . Acute renal failure 08/04/2012  . Hyponatremia 08/04/2012  . Hypokalemia 08/04/2012  . Anemia 08/04/2012  . HTN (hypertension) 08/04/2012  . Gout 08/04/2012   Past Medical History  Diagnosis Date  . Diabetes mellitus   . Hypertension   . Arthritis   . Prostate cancer   . Gout   . Coronary artery disease     a. presumed, s/p NSTEMI 07/2013-->Med Rx  . GERD (gastroesophageal reflux disease)   . CKD (chronic kidney disease), stage III   . Iron deficiency anemia     a. 07/2013 s/p multiple PRBC's.  . Heme positive stool   . Moderate to severe aortic stenosis     a.  07/2013 Echo: EF50-55%, no rwma, mod-sev AS (  0.78cm^2 VTI, 0.73cm^2 Vmax), mild AI, Mild MR, mod dil LA, PASP    Past Surgical History  Procedure Laterality Date  . Prostate surgery    . Umbilical hernia repair    . Carotid  endarterectomy    . Esophagogastroduodenoscopy N/A 08/20/2013    Hung:Multiple nonbleeding gastric AVMs s/p APC.     (Not in a hospital admission) No Known Allergies  History  Substance Use Topics  . Smoking status: Former Games developer  . Smokeless tobacco: Not on file  . Alcohol Use: No    Family History  Problem Relation Age of Onset  . Cancer Mother     Deceased  . Heart failure Sister     Deceased  . Heart failure Brother     Deceased  . Hypertension Mother     Deceased  . Hypertension Father      Objective:  Patient Vitals for the past 8 hrs:  SpO2  09/10/13 2341 100 %  09/10/13 2317 90 %   General appearance: alert, cooperative, elderly appearing male in no distress on BiPap Eyes: lids puffy Lungs: coarse breath sounds on BiPap Chest wall: no tenderness Heart: regular rate and rhythm, 3/6 systolic murmur Abdomen: soft, non-tender; bowel sounds normal; no masses,  no organomegaly Extremities: extremities normal, atraumatic, no cyanosis or edema Pulses: 2+ and symmetric Neurologic: Grossly normal  Results for orders placed during the hospital encounter of 09/10/13 (from the past 48 hour(s))  CBC WITH DIFFERENTIAL     Status: Abnormal   Collection Time    09/10/13 11:23 PM      Result Value Range   WBC 9.8  4.0 - 10.5 K/uL   RBC 2.97 (*) 4.22 - 5.81 MIL/uL   Hemoglobin 7.7 (*) 13.0 - 17.0 g/dL   HCT 16.1 (*) 09.6 - 04.5 %   MCV 83.2  78.0 - 100.0 fL   MCH 25.9 (*) 26.0 - 34.0 pg   MCHC 31.2  30.0 - 36.0 g/dL   RDW 40.9 (*) 81.1 - 91.4 %   Platelets 272  150 - 400 K/uL   Neutrophils Relative % 74  43 - 77 %   Neutro Abs 7.3  1.7 - 7.7 K/uL   Lymphocytes Relative 19  12 - 46 %   Lymphs Abs 1.9  0.7 - 4.0 K/uL   Monocytes Relative 5  3 - 12 %   Monocytes Absolute 0.5  0.1 - 1.0 K/uL   Eosinophils Relative 1  0 - 5 %   Eosinophils Absolute 0.1  0.0 - 0.7 K/uL   Basophils Relative 0  0 - 1 %   Basophils Absolute 0.0  0.0 - 0.1 K/uL  PRO B NATRIURETIC PEPTIDE      Status: Abnormal   Collection Time    09/10/13 11:23 PM      Result Value Range   Pro B Natriuretic peptide (BNP) 1262.0 (*) 0 - 450 pg/mL  POCT I-STAT TROPONIN I     Status: None   Collection Time    09/10/13 11:32 PM      Result Value Range   Troponin i, poc 0.02  0.00 - 0.08 ng/mL   Comment 3            Comment: Due to the release kinetics of cTnI,     a negative result within the first hours     of the onset of symptoms does not rule out     myocardial infarction with certainty.  If myocardial infarction is still suspected,     repeat the test at appropriate intervals.  POCT I-STAT, CHEM 8     Status: Abnormal   Collection Time    09/10/13 11:34 PM      Result Value Range   Sodium 134 (*) 135 - 145 mEq/L   Potassium 3.8  3.5 - 5.1 mEq/L   Chloride 104  96 - 112 mEq/L   BUN 18  6 - 23 mg/dL   Creatinine, Ser 1.47 (*) 0.50 - 1.35 mg/dL   Glucose, Bld 829 (*) 70 - 99 mg/dL   Calcium, Ion 5.62  1.30 - 1.30 mmol/L   TCO2 16  0 - 100 mmol/L   Hemoglobin 9.2 (*) 13.0 - 17.0 g/dL   HCT 86.5 (*) 78.4 - 69.6 %  POCT I-STAT 3, BLOOD GAS (G3+)     Status: Abnormal   Collection Time    09/10/13 11:39 PM      Result Value Range   pH, Arterial 7.333 (*) 7.350 - 7.450   pCO2 arterial 32.9 (*) 35.0 - 45.0 mmHg   pO2, Arterial 116.0 (*) 80.0 - 100.0 mmHg   Bicarbonate 17.5 (*) 20.0 - 24.0 mEq/L   TCO2 18  0 - 100 mmol/L   O2 Saturation 98.0     Acid-base deficit 8.0 (*) 0.0 - 2.0 mmol/L   Patient temperature 98.6 F     Collection site RADIAL, ALLEN'S TEST ACCEPTABLE     Drawn by RT     Sample type ARTERIAL     Dg Chest Portable 1 View  09/11/2013   CLINICAL DATA:  Respiratory distress and shortness of breath  EXAM: PORTABLE CHEST - 1 VIEW  COMPARISON:  08/16/2013  FINDINGS: Diffuse interstitial and airspace opacities. There is some sparing in the right mid lung - no emphysematous change in this region on chest CT 08/06/2012. Probable small pleural effusions. No cardiomegaly.  Aortic atherosclerosis. No pneumothorax.  IMPRESSION: Pulmonary edema with pleural effusions. Sparing in the right mid lung of uncertain cause or significance.   Electronically Signed   By: Tiburcio Pea M.D.   On: 09/11/2013 00:27    ECG:  Sinus rhythm HR 97 with frequent PVCs, diffuse ST depressions  Assessment/Plan: Acute respiratory failure with pulmonary edema Chronic diastolic HF Acute on chronic KD Moderate to Severe Aortic Stenosis Anemia, recently diagnosed AVMs CAD, recent NSTEMI DM with hyperglycemia  -Will admit to CCU -Continue BiPap, will wean overnight -IV diuresis with Lasix, strict I/Os -Monitor Cr, Monitor CBC -H/H low, will hold on restarting ASA/Plavix for now, troponins normal -Restart home BP meds -SSI

## 2013-09-11 NOTE — Progress Notes (Signed)
UR Completed.  [name] [phone] 09/11/2013  

## 2013-09-11 NOTE — Progress Notes (Signed)
Rec'd pt on bipap.  Pt c/o nose hurting, BBSH w/ minimal fine crackles in base.  Sats 100%.  I spoke w/ RN, will try to wean pt to VM.  Pt tol 50% VM for now, sat 100%, pt denies SOB, no distress noted.  Spoke w/ RN re: needing order for bipap from MD.

## 2013-09-11 NOTE — Progress Notes (Signed)
Pt had $539.00 cash in his belonging bag. 2RNs Sharyl Nimrod Donnella [place]) verified amount, sealed, labeled, sent to security. [name]

## 2013-09-11 NOTE — Progress Notes (Signed)
Paged [facility] cards about patient's being hypertensive, SBP in the 170-180s, patient had metoprolol 50mg  and hydralazine 50mg  ordered for 0330.  Asked Dr. Terressa Koyanagi if he wanted to use the schedule oral antihypertensives or if he wants to order PRN antihypertensives for patient. MD stated to give oral meds.  Will continue to monitor patient.

## 2013-09-11 NOTE — Progress Notes (Signed)
Patient Name: [name] Date of Encounter: 09/11/2013  Principal Problem:   Acute and chronic respiratory failure w/ pulmonary edema Active Problems:   CKD (chronic kidney disease) stage 3, GFR 30-59 ml/min   DM (diabetes mellitus) type II uncontrolled with renal manifestation   Acute on chronic diastolic CHF (congestive heart failure), NYHA class 4    SUBJECTIVE: Breathing better than on admission, no chest pain  OBJECTIVE Filed Vitals:   09/11/13 0700 09/11/13 0737 09/11/13 0739 09/11/13 0800  BP: 139/51  156/49 131/45  Pulse: 73  71 65  Temp:  97.5 F (36.4 C)    TempSrc:  Axillary    Resp: 23  20 17   Weight:      SpO2: 100%  100% 100%    Intake/Output Summary (Last 24 hours) at 09/11/13 1610 Last data filed at 09/11/13 0700  Gross per 24 hour  Intake      0 ml  Output   1271 ml  Net  -1271 ml   Filed Weights   09/11/13 0300  Weight: 145 lb 4.8 oz (65.908 kg)    PHYSICAL EXAM General: Well developed, well nourished, male in no acute distress. Head: Normocephalic, atraumatic.  Neck: Supple without bruits, JVD at 10 cm. Lungs:  Resp regular and unlabored, bilateral rales. Heart: RRR, S1, S2, no S3, S4, 2-3/6 murmur; no rub. Abdomen: Soft, non-tender, non-distended, BS + x 4.  Extremities: No clubbing, cyanosis, no edema.  Neuro: Alert and oriented X 3. Moves all extremities spontaneously. Psych: Normal affect.  LABS: CBC:  Recent Labs  09/10/13 2323 09/10/13 2334  WBC 9.8  --   NEUTROABS 7.3  --   HGB 7.7* 9.2*  HCT 24.7* 27.0*  MCV 83.2  --   PLT 272  --    Basic Metabolic Panel:  Recent Labs  96/04/54 2334 09/11/13 0405  NA 134*  --   K 3.8  --   CL 104  --   GLUCOSE 324*  --   BUN 18  --   CREATININE 1.90*  --   MG  --  1.7   Cardiac Enzymes:  Recent Labs  09/11/13 0405  TROPONINI <0.30    Recent Labs  09/10/13 2332  TROPIPOC 0.02   BNP: Pro B Natriuretic peptide (BNP)  Date/Time Value Range Status    09/10/2013 11:23 PM 1262.0* 0 - 450 pg/mL Final  08/06/2013  3:21 AM 222.8  0 - 450 pg/mL Final   TELE:   SR,     Radiology/Studies: Dg Chest Portable 1 View 09/11/2013   CLINICAL DATA:  Respiratory distress and shortness of breath  EXAM: PORTABLE CHEST - 1 VIEW  COMPARISON:  08/16/2013  FINDINGS: Diffuse interstitial and airspace opacities. There is some sparing in the right mid lung - no emphysematous change in this region on chest CT 08/06/2012. Probable small pleural effusions. No cardiomegaly. Aortic atherosclerosis. No pneumothorax.  IMPRESSION: Pulmonary edema with pleural effusions. Sparing in the right mid lung of uncertain cause or significance.   Electronically Signed   By: Tiburcio Pea M.D.   On: 09/11/2013 00:27     Current Medications:  . allopurinol  100 mg Oral Daily  . amLODipine  10 mg Oral Daily  . atorvastatin  40 mg Oral q1800  . furosemide  40 mg Intravenous BID  . heparin  5,000 Units Subcutaneous Q8H  . hydrALAZINE  50 mg Oral TID  . insulin aspart  0-5 Units Subcutaneous QHS  . insulin  aspart  0-9 Units Subcutaneous TID WC  . insulin aspart  3 Units Subcutaneous TID WC  . insulin glargine  24 Units Subcutaneous q morning - 10a  . metoprolol  50 mg Oral BID  . pantoprazole  40 mg Oral BID  . sodium chloride  3 mL Intravenous Q12H      ASSESSMENT AND PLAN: Principal Problem:   Acute and chronic respiratory failure w/ pulmonary edema - Now off BiPAP, continue Lasix BID and follow I/O.   Active Problems:   CKD (chronic kidney disease) stage 3, GFR 30-59 ml/min - RF worse than previous, continue to follow.     DM (diabetes mellitus) type II uncontrolled with renal manifestation - he is on home insulin plus SSI     Acute on chronic diastolic CHF (congestive heart failure), NYHA class 4 - see above   Signed, Theodore Demark , PA-C 8:08 AM 09/11/2013  History and all data above reviewed.  Patient examined.  I agree with the findings as above.   Breathing better.  No acute distress.  The patient exam reveals COR:RRR  ,  Lungs: Bilateral basilar crackles  ,  Abd: Positive bowel sounds, no rebound no guarding, Ext No edema.  All available labs, radiology testing, previous records reviewed. Agree with documented assessment and plan. Acute on chronic diastolic HF.  However, with his renal insufficiency we are going to need to be very careful with diuresis as has creat is up.  However,needs continued low dose Lasix.  I reviewed the notes including cardiology consult from his hospitalization in Sept.  There is moderately severe and possibly severe AS.  Not a surgical candidate and he would be a high risk for TAVR.  However, if he cannot be managed medically for volume and diastoic dysfunction we would need to at least consider this option.  Rollene Rotunda  9:18 AM  09/11/2013

## 2013-09-12 DIAGNOSIS — I5031 Acute diastolic (congestive) heart failure: Secondary | ICD-10-CM

## 2013-09-12 LAB — GLUCOSE, CAPILLARY
Glucose-Capillary: 108 mg/dL — ABNORMAL HIGH (ref 70–99)
Glucose-Capillary: 214 mg/dL — ABNORMAL HIGH (ref 70–99)
Glucose-Capillary: 97 mg/dL (ref 70–99)
Glucose-Capillary: 139 mg/dL — ABNORMAL HIGH (ref 70–99)

## 2013-09-12 LAB — BASIC METABOLIC PANEL
BUN: 26 mg/dL — ABNORMAL HIGH (ref 6–23)
CO2: 23 mEq/L (ref 19–32)
Chloride: 101 mEq/L (ref 96–112)
GFR calc Af Amer: 39 mL/min — ABNORMAL LOW (ref >90)
GFR calc non Af Amer: 34 mL/min — ABNORMAL LOW (ref >90)
Glucose, Bld: 200 mg/dL — ABNORMAL HIGH (ref 70–99)
Potassium: 3.4 mEq/L — ABNORMAL LOW (ref 3.5–5.1)

## 2013-09-12 LAB — CBC
HCT: 24.1 % — ABNORMAL LOW (ref 39.0–52.0)
Hemoglobin: 6.5 g/dL — CL (ref 13.0–17.0)
Hemoglobin: 7.8 g/dL — ABNORMAL LOW (ref 13.0–17.0)
MCH: 25.4 pg — ABNORMAL LOW (ref 26.0–34.0)
MCH: 26.7 pg (ref 26.0–34.0)
MCHC: 32.4 g/dL (ref 30.0–36.0)
MCV: 82.5 fL (ref 78.0–100.0)
Platelets: 208 10*3/uL (ref 150–400)
Platelets: 205 10*3/uL (ref 150–400)
RBC: 2.56 MIL/uL — ABNORMAL LOW (ref 4.22–5.81)
RBC: 2.92 MIL/uL — ABNORMAL LOW (ref 4.22–5.81)
RDW: 16.2 % — ABNORMAL HIGH (ref 11.5–15.5)
WBC: 4.6 10*3/uL (ref 4.0–10.5)
WBC: 5.4 10*3/uL (ref 4.0–10.5)

## 2013-09-12 LAB — PREPARE RBC (CROSSMATCH)

## 2013-09-12 MED ORDER — GLUCERNA SHAKE PO LIQD
237.0000 mL | Freq: Two times a day (BID) | ORAL | Status: DC
  Administered 2013-09-12 – 2013-09-15 (×6): 237 mL via ORAL

## 2013-09-12 MED ORDER — FUROSEMIDE 10 MG/ML IJ SOLN: 20.0000 mg | Freq: Once | INTRAMUSCULAR | Status: DC

## 2013-09-12 MED ORDER — DIPHENHYDRAMINE HCL 25 MG PO CAPS
25.0000 mg | ORAL_CAPSULE | Freq: Once | ORAL | Status: AC
  Administered 2013-09-12: 17:00:00 25 mg via ORAL
  Filled 2013-09-12: qty 1

## 2013-09-12 NOTE — Progress Notes (Signed)
Dicussed home health w/Pt. Pt seems to feel that he well give up independents by having HH and does not feel that [facility] can help keep him out of HP. Pt did agree to [facility] at this time

## 2013-09-12 NOTE — Progress Notes (Signed)
Pt rec from 2s. Pt O3x, no complaints of pain or sob. Will continue to monitor

## 2013-09-12 NOTE — Plan of Care (Signed)
Problem: Phase I Progression Outcomes Goal: EF % per last Echo/documented,Core Reminder form on chart 50-55%     

## 2013-09-12 NOTE — Progress Notes (Signed)
Pt's post transfusion Hgb=7.8. Dr. Dorchester Callas notified, no new orders made at this time. Pt asleep, resting fairly well.

## 2013-09-12 NOTE — Progress Notes (Signed)
CRITICAL VALUE ALERT  Critical value received:   HG 6.5 10-14 Date of notification:    Time of notification:  1240  Critical value read back:yes  Nurse who received alert:  Ninetta Lights  MD notified (1st page):  NP  R Barrett  Time of first page:  1245  MD notified (2nd page):  Time of second page:  Responding MD:  1247 Time MD responded:  R Barrett

## 2013-09-12 NOTE — Progress Notes (Signed)
   SUBJECTIVE:  Breathing much better.  Back to baseline   PHYSICAL EXAM Filed Vitals:   09/12/13 0500 09/12/13 0600 09/12/13 0700 09/12/13 0716  BP: 148/49 138/45 149/44   Pulse: 66 69 67   Temp:    97.9 F (36.6 C)  TempSrc:    Oral  Resp: 20 19 21    Height:      Weight:  141 lb 5 oz (64.1 kg)    SpO2: 100% 98% 100%    General:  No distress Lungs:  Few crackles that clear with coughing Heart:  RRR Abdomen:  Positive bowel sounds, no rebound no guarding Extremities:  No edema  LABS: Lab Results  Component Value Date   TROPONINI <0.30 09/11/2013   Results for orders placed during the hospital encounter of 09/10/13 (from the past 24 hour(s))  TROPONIN I     Status: None   Collection Time    09/11/13  8:51 AM      Result Value Range   Troponin I <0.30  <0.30 ng/mL  GLUCOSE, CAPILLARY     Status: Abnormal   Collection Time    09/11/13 12:35 PM      Result Value Range   Glucose-Capillary 141 (*) 70 - 99 mg/dL  GLUCOSE, CAPILLARY     Status: Abnormal   Collection Time    09/11/13  4:52 PM      Result Value Range   Glucose-Capillary 136 (*) 70 - 99 mg/dL   Comment 1 Documented in Chart     Comment 2 Notify RN    GLUCOSE, CAPILLARY     Status: Abnormal   Collection Time    09/11/13  9:34 PM      Result Value Range   Glucose-Capillary 128 (*) 70 - 99 mg/dL    Intake/Output Summary (Last 24 hours) at 09/12/13 0740 Last data filed at 09/12/13 0600  Gross per 24 hour  Intake    240 ml  Output   2200 ml  Net  -1960 ml     ASSESSMENT AND PLAN:  ACUTE ON CHRONIC DIASTOLIC HF:  Much improved with UO as above.  Weight down six pounds.    CKD (Stage 3):  Awaiting BMET results.  I will continue Lasix IV but likely he can have oral Lasix in the AM  HTN:  BP OK. Continue current therapy.  DM:  Blood sugars well controlled.     Rollene Rotunda 09/12/2013 7:40 AM

## 2013-09-12 NOTE — Progress Notes (Signed)
INITIAL NUTRITION ASSESSMENT  DOCUMENTATION CODES Per approved criteria  -Severe malnutrition in the context of chronic illness   INTERVENTION:  Glucerna Shake twice daily (220 kcals, 10 gm protein per 8 fl oz bottle) RD to follow for nutrition care plan  NUTRITION DIAGNOSIS: Inadequate oral intake related to decreased appetite as evidenced by PO intake 25%  Goal: Pt to meet >/= 90% of their estimated nutrition needs   Monitor:  PO & supplemental intake, weight, labs, I/O's  Reason for Assessment: Malnutrition Screening Tool Report  79 y.o. male  Admitting Dx: Acute and chronic respiratory failure  ASSESSMENT: Patient with PMH of DM, HTN, prostate cancer, gout, CAD, GERD for evaluation of shortness of breath; patient lives home alone and called EMS; placed on BiPAP by EMS and brought to ED for further evaluation.  Patient reports he ate breakfast well this AM; states he wasn't eating well PTA; noted patient lives alone and has history of "forgetting to eat" per family; patient with no recent weight loss this year (seems progressive in nature); PO intake 25% per flowsheet records; would benefit from nutrition supplements; amenable to Glucerna Shake daily -- RD to order.  Patient meets criteria for severe malnutrition in the context of chronic illness as evidenced by < 75% intake of estimated energy requirement for > 1 month and severe muscle loss (clavicles, acromion bone region).  Height: Ht Readings from Last 1 Encounters:  09/12/13 5\' 5"  (1.651 m)    Weight: Wt Readings from Last 1 Encounters:  09/12/13 141 lb 8.6 oz (64.2 kg)    Ideal Body Weight: 136 lb  % Ideal Body Weight: 104%  Wt Readings from Last 10 Encounters:  09/12/13 141 lb 8.6 oz (64.2 kg)  08/19/13 146 lb 2.6 oz (66.3 kg)  08/19/13 146 lb 2.6 oz (66.3 kg)  08/19/13 146 lb 2.6 oz (66.3 kg)  08/11/13 149 lb 0.5 oz (67.6 kg)  09/06/12 148 lb 5.9 oz (67.3 kg)  08/05/12 150 lb 5.7 oz (68.2 kg)   01/08/12 157 lb (71.215 kg)  10/18/11 162 lb (73.483 kg)    Usual Body Weight: 148 lb  % Usual Body Weight: 95%  BMI:  Body mass index is 23.55 kg/(m^2).  Estimated Nutritional Needs: Kcal: 1600-1800 Protein: 80-90 gm Fluid: 1.6-1.8 L  Skin: Intact  Diet Order: Cardiac  EDUCATION NEEDS: -No education needs identified at this time   Intake/Output Summary (Last 24 hours) at 09/12/13 1251 Last data filed at 09/12/13 0900  Gross per 24 hour  Intake      0 ml  Output   1950 ml  Net  -1950 ml    Labs:   Recent Labs Lab 09/10/13 2334 09/11/13 0405  NA 134*  --   K 3.8  --   CL 104  --   BUN 18  --   CREATININE 1.90*  --   MG  --  1.7  GLUCOSE 324*  --     CBG (last 3)   Recent Labs  09/11/13 2134 09/12/13 0733 09/12/13 1137  GLUCAP 128* 108* 214*    Scheduled Meds: . allopurinol  100 mg Oral Daily  . amLODipine  10 mg Oral Daily  . atorvastatin  40 mg Oral q1800  . furosemide  40 mg Intravenous BID  . heparin  5,000 Units Subcutaneous Q8H  . hydrALAZINE  50 mg Oral TID  . insulin aspart  0-5 Units Subcutaneous QHS  . insulin aspart  0-9 Units Subcutaneous TID WC  .  insulin aspart  3 Units Subcutaneous TID WC  . insulin glargine  24 Units Subcutaneous q morning - 10a  . metoprolol  50 mg Oral BID  . pantoprazole  40 mg Oral BID  . sodium chloride  3 mL Intravenous Q12H    Continuous Infusions:   Past Medical History  Diagnosis Date  . Diabetes mellitus   . Hypertension   . Arthritis   . Prostate cancer   . Gout   . Coronary artery disease     a. presumed, s/p NSTEMI 07/2013-->Med Rx  . GERD (gastroesophageal reflux disease)   . CKD (chronic kidney disease), stage III   . Iron deficiency anemia     a. 07/2013 s/p multiple PRBC's.  . Heme positive stool   . Moderate to severe aortic stenosis     a.  07/2013 Echo: EF50-55%, no rwma, mod-sev AS (0.78cm^2 VTI, 0.73cm^2 Vmax), mild AI, Mild MR, mod dil LA, PASP    Past Surgical  History  Procedure Laterality Date  . Prostate surgery    . Umbilical hernia repair    . Carotid endarterectomy    . Esophagogastroduodenoscopy N/A 08/20/2013    Hung:Multiple nonbleeding gastric AVMs s/p APC.    Maureen Chatters, RD, LDN Pager #: [phone] After-Hours Pager #: [phone]

## 2013-09-12 NOTE — Care Management Note (Addendum)
  Page 2 of 2   09/12/2013     1:51:07 PM   CARE MANAGEMENT NOTE 09/12/2013  Patient:  [name], [name]   Account Number:  [id number]  Date Initiated:  09/12/2013  Documentation initiated by:  Oletta Cohn  Subjective/Objective Assessment:   79 y.o. male who presents for evaluation of shortness of breath./Home alone     Action/Plan:   BiPap; IV diureses/ Home with Home Health   Anticipated DC Date:  09/15/2013   Anticipated DC Plan:  HOME W HOME HEALTH SERVICES      DC Planning Services  CM consult      [facility] Choice  HOME HEALTH   Choice offered to / List presented to:  C-1 Patient        HH arranged  HH-1 RN  HH-10 DISEASE MANAGEMENT      Status of service:  In process, will continue to follow Medicare Important Message given?   (If response is "NO", the following Medicare IM given date fields will be blank) Date Medicare IM given:   Date Additional Medicare IM given:    Discharge Disposition:    Per UR Regulation:    If discussed at Long Length of Stay Meetings, dates discussed:    Comments:  09/12/13 1320 Oletta Cohn, RN, BSN, [employer] [phone] Spoke with pt at bedside regarding discharge planning for [facility] Offered pt list of home health agencies to choose from.  Pt chose [facility] to render services. Hilda Lias of [facility] notified.  No DME needs identified at this time. NCM placed Physical Therapy consult.  Will add PT to [facility] needs if appropriate.  Contact Information   Name                                           Relation                                  Home   Hampton,Linda                              Other                                   [phone]   [facility]                           Daughter                               [phone]   Bedoy,[place]                          Daughter                                [phone]

## 2013-09-12 NOTE — Clinical Documentation Improvement (Signed)
THIS DOCUMENT IS NOT A PERMANENT PART OF THE MEDICAL RECORD  Please update your documentation with the medical record to reflect your response to this query. If you need help knowing how to do this please call [phone].  09/12/13   Dear Dr. Delton See / Associates,  In a better effort to capture your patient's severity of illness, reflect appropriate length of stay and utilization of resources, a review of the patient medical record has revealed the following indicators.    Based on your clinical judgment, please clarify and document in a progress note and/or discharge summary the clinical condition associated with the following supporting information:  In responding to this query please exercise your independent judgment.  The fact that a query is asked, does not imply that any particular answer is desired or expected.   Possible Clinical Conditions?  "    Severe Malnutrition    "     Protein Calorie Malnutrition  "    Severe Protein Calorie Malnutrition  "    Other Condition  "    Cannot clinically determine     Supporting Information: Risk Factors:  Signs & Symptoms: Ht: 5'5"      Wt: 141 lb 8.6 oz  BMI:  23.55   Treatment: Glucerna shake twice daily.    Nutrition Consult: 10/17: Patient meets criteria for severe malnutrition in the context of chronic illness as evidenced by < 75% intake of estimated energy requirement for > 1 month and severe muscle loss (clavicles, acromion bone region).   You may use possible, probable, or suspect with inpatient documentation. possible, probable, suspected diagnoses MUST be documented at the time of discharge  Reviewed:  Additional documentation provided per hospital problem list, 09/13/13.                                              Thank You,  Marciano Sequin,  Clinical Documentation Specialist: [phone] Health Information Management [place]

## 2013-09-13 DIAGNOSIS — E43 Unspecified severe protein-calorie malnutrition: Secondary | ICD-10-CM | POA: Insufficient documentation

## 2013-09-13 DIAGNOSIS — I359 Nonrheumatic aortic valve disorder, unspecified: Secondary | ICD-10-CM

## 2013-09-13 LAB — GLUCOSE, CAPILLARY
Glucose-Capillary: 118 mg/dL — ABNORMAL HIGH (ref 70–99)
Glucose-Capillary: 253 mg/dL — ABNORMAL HIGH (ref 70–99)
Glucose-Capillary: 117 mg/dL — ABNORMAL HIGH (ref 70–99)
Glucose-Capillary: 176 mg/dL — ABNORMAL HIGH (ref 70–99)

## 2013-09-13 LAB — BASIC METABOLIC PANEL
BUN: 26 mg/dL — ABNORMAL HIGH (ref 6–23)
Calcium: 9.7 mg/dL (ref 8.4–10.5)
Chloride: 102 mEq/L (ref 96–112)
GFR calc Af Amer: 39 mL/min — ABNORMAL LOW (ref >90)
GFR calc non Af Amer: 34 mL/min — ABNORMAL LOW (ref >90)
Potassium: 3.3 mEq/L — ABNORMAL LOW (ref 3.5–5.1)
Sodium: 139 mEq/L (ref 135–145)

## 2013-09-13 LAB — TYPE AND SCREEN
ABO/RH(D): O POS
Antibody Screen: NEGATIVE

## 2013-09-13 MED ORDER — FUROSEMIDE 40 MG PO TABS
40.0000 mg | ORAL_TABLET | Freq: Two times a day (BID) | ORAL | Status: DC
  Administered 2013-09-13 – 2013-09-15 (×4): 40 mg via ORAL
  Filled 2013-09-13 (×6): qty 1

## 2013-09-13 MED ORDER — POTASSIUM CHLORIDE CRYS ER 20 MEQ PO TBCR
20.0000 meq | EXTENDED_RELEASE_TABLET | Freq: Every day | ORAL | Status: DC
  Administered 2013-09-13 – 2013-09-15 (×3): 20 meq via ORAL
  Filled 2013-09-13 (×4): qty 1

## 2013-09-13 NOTE — Progress Notes (Signed)
Primary cardiologist: Dr. Prentice Docker  Subjective:   No chest pain, intermittently short of breath with activity although breathing is better in general. States good appetite this morning.   Objective:   Temp:  [97.8 F (36.6 C)-98.6 F (37 C)] 98 F (36.7 C) (10/18 0939) Pulse Rate:  [63-78] 76 (10/18 0939) Resp:  [18-20] 20 (10/18 0939) BP: (120-148)/(43-56) 146/44 mmHg (10/18 0939) SpO2:  [95 %-100 %] 100 % (10/18 0939) Weight:  [139 lb 5.3 oz (63.2 kg)-141 lb 8.6 oz (64.2 kg)] 139 lb 5.3 oz (63.2 kg) (10/18 0604) Last BM Date: 09/11/13 (per PT)  Filed Weights   09/12/13 0600 09/12/13 1045 09/13/13 0604  Weight: 141 lb 5 oz (64.1 kg) 141 lb 8.6 oz (64.2 kg) 139 lb 5.3 oz (63.2 kg)    Intake/Output Summary (Last 24 hours) at 09/13/13 1000 Last data filed at 09/13/13 0940  Gross per 24 hour  Intake   1095 ml  Output   2000 ml  Net   -905 ml    Telemetry: Sinus rhythm.  Exam:  General: Thin elderly male, no distress.  Lungs: Decreased breath sounds at bases.  Cardiac: RRR, 3/6 systolic murmur at base.  Extremities:  No pitting.  Lab Results:  Basic Metabolic Panel:  Recent Labs Lab 09/10/13 2334 09/11/13 0405 09/12/13 1159 09/13/13 0830  NA 134*  --  135 139  K 3.8  --  3.4* 3.3*  CL 104  --  101 102  CO2  --   --  23 24  GLUCOSE 324*  --  200* 155*  BUN 18  --  26* 26*  CREATININE 1.90*  --  1.68* 1.69*  CALCIUM  --   --  9.5 9.7  MG  --  1.7  --   --     CBC:  Recent Labs Lab 09/10/13 2323 09/10/13 2334 09/12/13 1159 09/12/13 2228  WBC 9.8  --  4.6 5.4  HGB 7.7* 9.2* 6.5* 7.8*  HCT 24.7* 27.0* 21.2* 24.1*  MCV 83.2  --  82.8 82.5  PLT 272  --  208 205    Cardiac Enzymes:  Recent Labs Lab 09/11/13 0405 09/11/13 0851  TROPONINI <0.30 <0.30    BNP:  Recent Labs  08/06/13 0321 09/10/13 2323  PROBNP 222.8 1262.0*     Medications:   Scheduled Medications: . allopurinol  100 mg Oral Daily  . amLODipine  10 mg  Oral Daily  . atorvastatin  40 mg Oral q1800  . feeding supplement (GLUCERNA SHAKE)  237 mL Oral BID BM  . furosemide  20 mg Intravenous Once  . furosemide  40 mg Intravenous BID  . heparin  5,000 Units Subcutaneous Q8H  . hydrALAZINE  50 mg Oral TID  . insulin aspart  0-5 Units Subcutaneous QHS  . insulin aspart  0-9 Units Subcutaneous TID WC  . insulin aspart  3 Units Subcutaneous TID WC  . insulin glargine  24 Units Subcutaneous q morning - 10a  . metoprolol  50 mg Oral BID  . pantoprazole  40 mg Oral BID  . sodium chloride  3 mL Intravenous Q12H     PRN Medications:  sodium chloride, acetaminophen, hydrALAZINE, ondansetron (ZOFRAN) IV, sodium chloride   Assessment:   1. Acute on chronic diastolic heart failure.  2. Severe aortic stenosis. Being managed conservatively at this point.  3. CKD, stage III, creatinine 1.6 currently.  4. Severe microcytic anemia with history of GI bleed, multiple AVMs, not on  aspirin or Plavix. Has received PRBC transfusion.  5. NSTEMI in September. Managed conservatively.   Plan/Discussion:    Current medications include Norvasc, Lipitor, intravenous Lasix, hydralazine. Replete potassium. Will convert to oral Lasix today. Followup BMET and CBC in the a.m. Possibly discharge over the next 24 hours. He has declined home health services.   Jonelle Sidle, M.D., F.A.C.C.

## 2013-09-13 NOTE — Progress Notes (Signed)
Pt in bed eating breakfast, no complaints of pain or sob. Will continue to monitor Pt.

## 2013-09-13 NOTE — Evaluation (Signed)
Physical Therapy Evaluation Patient Details Name: [name] MRN: [id number] DOB: Dec 17, 1919 Today's Date: 09/13/2013 Time: 4098-1191 PT Time Calculation (min): 22 min  PT Assessment / Plan / Recommendation History of Present Illness  [name] is a 79 y.o. male who presents for evaluation of shortness of breath.  He has complex medical history and has had two recent admission in September.  He has had recent NSTEMI which was medically managed due to comorbdities included anemia and CKD  Clinical Impression  Pt pleasant with decreased awareness of deficits and need for assist with mobility. Pt incontinent of urine during gait with assist for linen change and pericare end of session. Pt with below deficits and will benefit from acute therapy to maximize balance, safety and mobility to decrease burden of care.     PT Assessment  Patient needs continued PT services    Follow Up Recommendations  Home health PT;Supervision - Intermittent    Does the patient have the potential to tolerate intense rehabilitation      Barriers to Discharge Decreased caregiver support      Equipment Recommendations  None recommended by PT    Recommendations for Other Services     Frequency Min 3X/week    Precautions / Restrictions Precautions Precautions: Fall   Pertinent Vitals/Pain sats 89-94% on RA with gait 96% on RA at rest No pain      Mobility  Bed Mobility Bed Mobility: Supine to Sit Supine to Sit: 6: Modified independent (Device/Increase time);HOB flat;With rails Transfers Transfers: Sit to Stand;Stand to Sit;Stand Pivot Transfers Sit to Stand: 4: Min guard;From bed;From chair/3-in-1 Stand to Sit: 4: Min guard;To chair/3-in-1 Stand Pivot Transfers: 4: Min guard Details for Transfer Assistance: Pt with posterior LOB on initial stand and pivot from bed to chair with use of bil UE to prevent fall and guarding for safety Ambulation/Gait Ambulation/Gait Assistance: 4: Min  guard Ambulation Distance (Feet): 200 Feet Assistive device: None Ambulation/Gait Assistance Details: Pt with unsteady gait with decreased speed and will need to use RW for future trials Gait Pattern: Step-through pattern;Decreased stride length;Trunk flexed Gait velocity: decreased Stairs: No    Exercises     PT Diagnosis: Difficulty walking;Generalized weakness  PT Problem List: Decreased strength;Decreased activity tolerance;Decreased mobility;Decreased safety awareness;Decreased balance;Decreased knowledge of use of DME PT Treatment Interventions: Gait training;Therapeutic exercise;Therapeutic activities;Balance training;Functional mobility training;DME instruction;Patient/family education     PT Goals(Current goals can be found in the care plan section) Acute Rehab PT Goals Patient Stated Goal: return home and remain independent PT Goal Formulation: With patient Time For Goal Achievement: 09/27/13 Potential to Achieve Goals: Good  Visit Information  Last PT Received On: 09/13/13 Assistance Needed: +1 History of Present Illness: [name] is a 79 y.o. male who presents for evaluation of shortness of breath.  He has complex medical history and has had two recent admission in September.  He has had recent NSTEMI which was medically managed due to comorbdities included anemia and CKD       Prior Functioning  Home Living Family/patient expects to be discharged to:: Private residence Living Arrangements: Alone Available Help at Discharge: Friend(s);Available PRN/intermittently Type of Home: Apartment Home Access: Level entry Home Layout: One level Home Equipment: Walker - 2 wheels;Cane - quad Prior Function Level of Independence: Independent Comments: pt has assist for housework but does all his own cooking Communication Communication: No difficulties    Cognition  Cognition Arousal/Alertness: Awake/alert Behavior During Therapy: WFL for tasks  assessed/performed Overall Cognitive Status: Within  Functional Limits for tasks assessed    Extremity/Trunk Assessment Upper Extremity Assessment Upper Extremity Assessment: Generalized weakness Lower Extremity Assessment Lower Extremity Assessment: Generalized weakness (bil LE all myotomes 4/5) Cervical / Trunk Assessment Cervical / Trunk Assessment: Normal   Balance Balance Balance Assessed: Yes Static Sitting Balance Static Sitting - Balance Support: No upper extremity supported;Feet supported Static Sitting - Level of Assistance: 6: Modified independent (Device/Increase time) Static Sitting - Comment/# of Minutes: 2 Static Standing Balance Static Standing - Balance Support: No upper extremity supported Static Standing - Level of Assistance: 5: Stand by assistance  End of Session PT - End of Session Equipment Utilized During Treatment: Gait belt Activity Tolerance: Patient tolerated treatment well Patient left: in chair;with call bell/phone within reach Nurse Communication: Mobility status  GP     Delorse Lek 09/13/2013, 1:38 PM Delaney Meigs, PT [phone]

## 2013-09-14 LAB — GLUCOSE, CAPILLARY
Glucose-Capillary: 132 mg/dL — ABNORMAL HIGH (ref 70–99)
Glucose-Capillary: 154 mg/dL — ABNORMAL HIGH (ref 70–99)
Glucose-Capillary: 118 mg/dL — ABNORMAL HIGH (ref 70–99)
Glucose-Capillary: 163 mg/dL — ABNORMAL HIGH (ref 70–99)

## 2013-09-14 NOTE — Progress Notes (Signed)
.    Primary cardiologist: Dr. Prentice Docker  Subjective:   Still weak, but feels better. Has done some ambulation with PT. They have recommended further PT services, although he has generally declined home health.   Objective:   Temp:  [98.9 F (37.2 C)-99.4 F (37.4 C)] 99 F (37.2 C) (10/19 0445) Pulse Rate:  [75-88] 80 (10/19 0940) Resp:  [20] 20 (10/19 0445) BP: (138-160)/(41-52) 147/48 mmHg (10/19 0940) SpO2:  [92 %-97 %] 94 % (10/19 0445) Weight:  [138 lb 7.2 oz (62.8 kg)] 138 lb 7.2 oz (62.8 kg) (10/19 0445) Last BM Date: 09/11/13  Filed Weights   09/12/13 1045 09/13/13 0604 09/14/13 0445  Weight: 141 lb 8.6 oz (64.2 kg) 139 lb 5.3 oz (63.2 kg) 138 lb 7.2 oz (62.8 kg)    Intake/Output Summary (Last 24 hours) at 09/14/13 0950 Last data filed at 09/14/13 0946  Gross per 24 hour  Intake   1203 ml  Output   1750 ml  Net   -547 ml    Exam:  General: Thin elderly male, no distress.  Lungs: Decreased breath sounds at bases.  Cardiac: RRR, 3/6 systolic murmur at base.  Extremities:  No pitting.  Lab Results:  Basic Metabolic Panel:  Recent Labs Lab 09/10/13 2334 09/11/13 0405 09/12/13 1159 09/13/13 0830  NA 134*  --  135 139  K 3.8  --  3.4* 3.3*  CL 104  --  101 102  CO2  --   --  23 24  GLUCOSE 324*  --  200* 155*  BUN 18  --  26* 26*  CREATININE 1.90*  --  1.68* 1.69*  CALCIUM  --   --  9.5 9.7  MG  --  1.7  --   --     CBC:  Recent Labs Lab 09/10/13 2323 09/10/13 2334 09/12/13 1159 09/12/13 2228  WBC 9.8  --  4.6 5.4  HGB 7.7* 9.2* 6.5* 7.8*  HCT 24.7* 27.0* 21.2* 24.1*  MCV 83.2  --  82.8 82.5  PLT 272  --  208 205    Cardiac Enzymes:  Recent Labs Lab 09/11/13 0405 09/11/13 0851  TROPONINI <0.30 <0.30    BNP:  Recent Labs  08/06/13 0321 09/10/13 2323  PROBNP 222.8 1262.0*     Medications:   Scheduled Medications: . allopurinol  100 mg Oral Daily  . amLODipine  10 mg Oral Daily  . atorvastatin  40 mg Oral  q1800  . feeding supplement (GLUCERNA SHAKE)  237 mL Oral BID BM  . furosemide  20 mg Intravenous Once  . furosemide  40 mg Oral BID  . heparin  5,000 Units Subcutaneous Q8H  . hydrALAZINE  50 mg Oral TID  . insulin aspart  0-5 Units Subcutaneous QHS  . insulin aspart  0-9 Units Subcutaneous TID WC  . insulin aspart  3 Units Subcutaneous TID WC  . insulin glargine  24 Units Subcutaneous q morning - 10a  . metoprolol  50 mg Oral BID  . pantoprazole  40 mg Oral BID  . potassium chloride  20 mEq Oral Daily  . sodium chloride  3 mL Intravenous Q12H    PRN Medications: sodium chloride, acetaminophen, hydrALAZINE, ondansetron (ZOFRAN) IV, sodium chloride   Assessment:   1. Acute on chronic diastolic heart failure. Improving. Changed to oral Lasix today.  2. Severe aortic stenosis. Being managed conservatively at this point.  3. CKD, stage III, creatinine 1.6 currently.  4. Severe microcytic anemia with  history of GI bleed, multiple AVMs, not on aspirin or Plavix. Has received PRBC transfusion.  5. NSTEMI in September. Managed conservatively.   Plan/Discussion:    Current medications include Norvasc, Lipitor, hydralazine. Observe full effect of oral Lasix today. Ambulate as able. Followup BMET and CBC in the a.m. Possibly discharge in the a.m.. He has declined home health services.   Jonelle Sidle, M.D., F.A.C.C.

## 2013-09-14 NOTE — Progress Notes (Signed)
Patient ambulated approximately 100 feet in the hallway with one assist.  Patient remained a little unsteady on his feet.  Physical therapy has recommended [facility] PT for the patient, which seems to be appropriate.  Anastasia Fiedler RN

## 2013-09-15 ENCOUNTER — Telehealth: Payer: Self-pay

## 2013-09-15 DIAGNOSIS — D649 Anemia, unspecified: Secondary | ICD-10-CM

## 2013-09-15 LAB — GLUCOSE, CAPILLARY: Glucose-Capillary: 125 mg/dL — ABNORMAL HIGH (ref 70–99)

## 2013-09-15 MED ORDER — POTASSIUM CHLORIDE CRYS ER 20 MEQ PO TBCR: 20.0000 meq | EXTENDED_RELEASE_TABLET | Freq: Every day | ORAL | Status: DC

## 2013-09-15 MED ORDER — AMLODIPINE BESYLATE 10 MG PO TABS: 10.0000 mg | ORAL_TABLET | Freq: Every day | ORAL | Status: DC

## 2013-09-15 MED ORDER — FUROSEMIDE 40 MG PO TABS: 40.0000 mg | ORAL_TABLET | Freq: Two times a day (BID) | ORAL | Status: DC

## 2013-09-15 MED ORDER — POTASSIUM CHLORIDE CRYS ER 20 MEQ PO TBCR
20.0000 meq | EXTENDED_RELEASE_TABLET | Freq: Once | ORAL | Status: AC
  Administered 2013-09-15: 20 meq via ORAL

## 2013-09-15 NOTE — Progress Notes (Signed)
Lab tech in room to draw pt. Labs. RN notified of pts. Refusal. RN informed pt. Of importance of lab results concerning pt. Care and treatment. Pt. Continues to refuse. Klaryssa Fauth, Cheryll Dessert

## 2013-09-15 NOTE — Telephone Encounter (Signed)
TCM patient d/c 10.20.14 from [facility], home health to do lab prior to appt.  Scheduled appt for 10.24.14.  DX CHF.

## 2013-09-15 NOTE — Progress Notes (Signed)
Requested inpatient team to schedule PCP appointment with Dr Loleta Chance.  Of note, [facility] Care Management services does not replace or interfere with any services that are arranged by inpatient case management or social work.  For additional questions or referrals please contact Anibal Henderson BSN RN [facility] Liaison at [phone].

## 2013-09-15 NOTE — Discharge Summary (Signed)
CARDIOLOGY DISCHARGE SUMMARY   Patient ID: [name] MRN: [id number] DOB/AGE: [birth year] 79 y.o.  Admit date: 09/10/2013 Discharge date: 09/15/2013  Primary Discharge Diagnosis:    Acute and chronic respiratory failure w/ pulmonary edema - weight 136 pounds at discharge Secondary Discharge Diagnosis:    CKD (chronic kidney disease) stage 3, GFR 30-59 ml/min   DM (diabetes mellitus) type II uncontrolled with renal manifestation   Acute on chronic diastolic CHF (congestive heart failure), NYHA class 4   Protein-calorie malnutrition, severe   Hypokalemia   Anemia   Deconditioning  Hospital Course: [name] is a 79 y.o. male with a recent admission for a NSTEMI managed medically and a GI bleed with AVMs. He was discharged on 08/21/2013. On 09/11/2013, he developed acute shortness of breath. EMS was called. He was admitted for respiratory failure with pulmonary edema.  Initially, he required BiPAP. He was diuresed with IV Lasix and his condition improved. He was continued on the Lasix be changed to nasal cannula once his respiratory status improved. His renal function was followed closely and he had acute on chronic renal insufficiency on admission with a creatinine of 1.9. This is increased from his previous discharge creatinine of 1.29. His BUN on admission was 18. By discharge his BUN was 26 with a creatinine of 1.69. He had hypokalemia and his potassium was supplemented. He lost 9 pounds during his hospital stay and his discharge weight is 136.  He has a history of anemia and GI bleed secondary to AVMs. His iron level has previously been low (less than 10). He is on prescription iron at home. His hemoglobin dropped to 6.5 with hematocrit of 21.2 on 09/12/2013. He was transfused one unit of packed red blood cells. He will continue his iron supplement and follow up with primary care.  He was felt to have significant deconditioning from repeat hospitalizations. He was seen by  physical therapy and ambulated with them. Initially he was reluctant to have home physical therapy. However, he lives alone and is deconditioned. He will benefit from home physical therapy and a home health RN to help manage his medical issues. The patient agreed to this prior to discharge.  By 09/15/2013, [name]'s ambulation had improved. He was seen by Dr. Elease Hashimoto and it was felt no further inpatient workup was indicated. [name] is considered stable for discharge, in improved condition, to be followed closely as an outpatient.  Labs:   Lab Results  Component Value Date   WBC 5.4 09/12/2013   HGB 7.8* 09/12/2013   HCT 24.1* 09/12/2013   MCV 82.5 09/12/2013   PLT 205 09/12/2013     Recent Labs Lab 09/13/13 0830  NA 139  K 3.3*  CL 102  CO2 24  BUN 26*  CREATININE 1.69*  CALCIUM 9.7  GLUCOSE 155*   Pro B Natriuretic peptide (BNP)  Date/Time Value Range Status  09/10/2013 11:23 PM 1262.0* 0 - 450 pg/mL Final  08/06/2013  3:21 AM 222.8  0 - 450 pg/mL Final      Radiology: Dg Chest Portable 1 View 09/11/2013   CLINICAL DATA:  Respiratory distress and shortness of breath  EXAM: PORTABLE CHEST - 1 VIEW  COMPARISON:  08/16/2013  FINDINGS: Diffuse interstitial and airspace opacities. There is some sparing in the right mid lung - no emphysematous change in this region on chest CT 08/06/2012. Probable small pleural effusions. No cardiomegaly. Aortic atherosclerosis. No pneumothorax.  IMPRESSION: Pulmonary edema with pleural effusions. Sparing in  the right mid lung of uncertain cause or significance.   Electronically Signed   By: Tiburcio Pea M.D.   On: 09/11/2013 00:27   EKG:  Sinus tachycardia, rate 109, and new inferior Q waves, replacing ST depression from previous admission  FOLLOW UP PLANS AND APPOINTMENTS No Known Allergies   Medication List    STOP taking these medications       clopidogrel 75 MG tablet  Commonly known as:  PLAVIX      TAKE these medications        allopurinol 100 MG tablet  Commonly known as:  ZYLOPRIM  Take 100 mg by mouth daily.     amLODipine 10 MG tablet  Commonly known as:  NORVASC  Take 1 tablet (10 mg total) by mouth daily.     atorvastatin 40 MG tablet  Commonly known as:  LIPITOR  Take 1 tablet (40 mg total) by mouth daily at 6 PM.     furosemide 40 MG tablet  Commonly known as:  LASIX  Take 1 tablet (40 mg total) by mouth 2 (two) times daily.     hydrALAZINE 50 MG tablet  Commonly known as:  APRESOLINE  Take 1 tablet (50 mg total) by mouth 3 (three) times daily.     insulin glargine 100 UNIT/ML injection  Commonly known as:  LANTUS  Inject 24-28 Units into the skin daily. 24 units if blood sugar is below 150 and 28 units if blood sugar is above 150.     metoprolol 50 MG tablet  Commonly known as:  LOPRESSOR  Take 1 tablet (50 mg total) by mouth 2 (two) times daily.     pantoprazole 40 MG tablet  Commonly known as:  PROTONIX  Take 1 tablet (40 mg total) by mouth 2 (two) times daily.     potassium chloride SA 20 MEQ tablet  Commonly known as:  K-DUR,KLOR-CON  Take 1 tablet (20 mEq total) by mouth daily.        Discharge Orders   Future Appointments Provider Department Dept Phone   09/19/2013 3:30 PM Jodelle Gross, NP [facility] [place] [facility] [phone]   Future Orders Complete By Expires   (HEART FAILURE PATIENTS) Call MD:  Anytime you have any of the following symptoms: 1) 3 pound weight gain in 24 hours or 5 pounds in 1 week 2) shortness of breath, with or without a dry hacking cough 3) swelling in the hands, feet or stomach 4) if you have to sleep on extra pillows at night in order to breathe.  As directed    Diet - low sodium heart healthy  As directed    Diet Carb Modified  As directed    Increase activity slowly  As directed      Follow-up Information   Follow up with Joni Reining, NP On 09/19/2013. (At 3:30 PM)    Specialty:  Nurse Practitioner   Contact information:   [address] [place] [address] [phone]       BRING ALL MEDICATIONS WITH YOU TO FOLLOW UP APPOINTMENTS  Time spent with patient to include physician time: 36 min Signed: Theodore Demark, PA-C 09/15/2013, 10:30 AM Co-Sign MD  Attending Note:   The patient was seen and examined.  Agree with assessment and plan as noted above.  Changes made to the above note as needed.  See my note from earlier today.  Pt appears to be stable for DC   Alvia Grove., MD, [facility]  09/15/2013, 11:22 AM

## 2013-09-15 NOTE — Progress Notes (Signed)
Patient verbalized understanding of discharge orders.  Valuables returned from security.  Discharged via wheelchair.

## 2013-09-15 NOTE — Progress Notes (Signed)
Physical Therapy Treatment Patient Details Name: [name] MRN: [id number] DOB: Jan 14, 1920 Today's Date: 09/15/2013 Time: 8657-8469 PT Time Calculation (min): 27 min  PT Assessment / Plan / Recommendation  History of Present Illness [name] is a 79 y.o. male who presents for evaluation of shortness of breath.  He has complex medical history and has had two recent admission in September.  He has had recent NSTEMI which was medically managed due to comorbdities included anemia and CKD   PT Comments   Pt with great improvement in gait from evaluation with use of RW with increased distance, safety and speed. Pt educated for need to use RW at all times with gait, pt verbalized understanding and agreed. Pt also educated for HEP and encouraged to perform daily. Will continue to follow.   Follow Up Recommendations  Home health PT     Does the patient have the potential to tolerate intense rehabilitation     Barriers to Discharge        Equipment Recommendations       Recommendations for Other Services    Frequency     Progress towards PT Goals Progress towards PT goals: Progressing toward goals  Plan Current plan remains appropriate    Precautions / Restrictions Precautions Precautions: Fall   Pertinent Vitals/Pain No pain   Mobility  Bed Mobility Bed Mobility: Not assessed Transfers Sit to Stand: 6: Modified independent (Device/Increase time);From chair/3-in-1;With armrests;With upper extremity assist Stand to Sit: 6: Modified independent (Device/Increase time);With armrests;To chair/3-in-1;With upper extremity assist Ambulation/Gait Ambulation/Gait Assistance: 5: Supervision Ambulation Distance (Feet): 400 Feet Assistive device: Rolling walker Ambulation/Gait Assistance Details: cueing for posture and position in RW with increased stride and speed from eval Gait Pattern: Step-through pattern;Decreased stride length Gait velocity: decreased 30'/19sec= 1.58 Stairs: No     Exercises General Exercises - Lower Extremity Long Arc Quad: AROM;Both;20 reps;Seated Hip ABduction/ADduction: AROM;Both;20 reps;Seated Hip Flexion/Marching: AROM;Both;20 reps;Seated Toe Raises: AROM;Seated;Both;20 reps Heel Raises: AROM;Seated;Both;20 reps   PT Diagnosis:    PT Problem List:   PT Treatment Interventions:     PT Goals (current goals can now be found in the care plan section)    Visit Information  Last PT Received On: 09/15/13 Assistance Needed: +1 History of Present Illness: [name] is a 79 y.o. male who presents for evaluation of shortness of breath.  He has complex medical history and has had two recent admission in September.  He has had recent NSTEMI which was medically managed due to comorbdities included anemia and CKD    Subjective Data      Cognition  Cognition Arousal/Alertness: Awake/alert Behavior During Therapy: WFL for tasks assessed/performed Overall Cognitive Status: Within Functional Limits for tasks assessed    Balance     End of Session PT - End of Session Equipment Utilized During Treatment: Gait belt Activity Tolerance: Patient tolerated treatment well Patient left: in chair;with call bell/phone within reach Nurse Communication: Mobility status   GP     Toney Sang Beth 09/15/2013, 10:20 AM Delaney Meigs, PT [phone]

## 2013-09-15 NOTE — Progress Notes (Addendum)
.    Primary cardiologist: Dr. Prentice Docker  Subjective:   Still weak, but feels better. Has done some ambulation with PT. They have recommended further PT services, although he has generally declined home health.  He is feeling well and is ready to go home.    Objective:   Temp:  [97.3 F (36.3 C)-98.5 F (36.9 C)] 98.5 F (36.9 C) (10/20 0559) Pulse Rate:  [71-80] 71 (10/20 0559) Resp:  [18-20] 18 (10/20 0559) BP: (147-156)/(48-53) 149/51 mmHg (10/20 0559) SpO2:  [96 %-99 %] 96 % (10/20 0559) Weight:  [136 lb 14.5 oz (62.1 kg)] 136 lb 14.5 oz (62.1 kg) (10/20 0559)  Last BM Date: 09/11/13  Filed Weights   09/13/13 0604 09/14/13 0445 09/15/13 0559  Weight: 139 lb 5.3 oz (63.2 kg) 138 lb 7.2 oz (62.8 kg) 136 lb 14.5 oz (62.1 kg)    Intake/Output Summary (Last 24 hours) at 09/15/13 0828 Last data filed at 09/15/13 0824  Gross per 24 hour  Intake    603 ml  Output   1225 ml  Net   -622 ml    Exam:  General: Thin elderly male, no distress.  Lungs: Decreased breath sounds at bases.  Cardiac: RRR, 3/6 systolic murmur at base.  Extremities:  No pitting.  Lab Results:  Basic Metabolic Panel:  Recent Labs Lab 09/10/13 2334 09/11/13 0405 09/12/13 1159 09/13/13 0830  NA 134*  --  135 139  K 3.8  --  3.4* 3.3*  CL 104  --  101 102  CO2  --   --  23 24  GLUCOSE 324*  --  200* 155*  BUN 18  --  26* 26*  CREATININE 1.90*  --  1.68* 1.69*  CALCIUM  --   --  9.5 9.7  MG  --  1.7  --   --     CBC:  Recent Labs Lab 09/10/13 2323 09/10/13 2334 09/12/13 1159 09/12/13 2228  WBC 9.8  --  4.6 5.4  HGB 7.7* 9.2* 6.5* 7.8*  HCT 24.7* 27.0* 21.2* 24.1*  MCV 83.2  --  82.8 82.5  PLT 272  --  208 205    Cardiac Enzymes:  Recent Labs Lab 09/11/13 0405 09/11/13 0851  TROPONINI <0.30 <0.30    BNP:  Recent Labs  08/06/13 0321 09/10/13 2323  PROBNP 222.8 1262.0*     Medications:   Scheduled Medications: . allopurinol  100 mg Oral Daily  .  amLODipine  10 mg Oral Daily  . atorvastatin  40 mg Oral q1800  . feeding supplement (GLUCERNA SHAKE)  237 mL Oral BID BM  . furosemide  20 mg Intravenous Once  . furosemide  40 mg Oral BID  . heparin  5,000 Units Subcutaneous Q8H  . hydrALAZINE  50 mg Oral TID  . insulin aspart  0-5 Units Subcutaneous QHS  . insulin aspart  0-9 Units Subcutaneous TID WC  . insulin aspart  3 Units Subcutaneous TID WC  . insulin glargine  24 Units Subcutaneous q morning - 10a  . metoprolol  50 mg Oral BID  . pantoprazole  40 mg Oral BID  . potassium chloride  20 mEq Oral Daily  . sodium chloride  3 mL Intravenous Q12H    PRN Medications: sodium chloride, acetaminophen, hydrALAZINE, ondansetron (ZOFRAN) IV, sodium chloride   Assessment:   1. Acute on chronic diastolic heart failure.   Changed to oral Lasix .  He is back to baseline according to the patient.  2. Severe aortic stenosis. Being managed conservatively at this point.  3. CKD, stage III, creatinine 1.6 currently.  4. Severe microcytic anemia with history of GI bleed, multiple AVMs, not on aspirin or Plavix. Has received PRBC transfusion.  5. NSTEMI in September. Managed conservatively.  6. Hypokalemia:  He is on Kdur 20 meq a day.  Will give an extra 20 meq today.  He will need that followed as OP.    Plan/Discussion:    He seems to be back to baseline.  His Hb has improved.  Will need to follow up with his medical doctor for his anemia.    Vesta Mixer, Montez Hageman., MD, [facility] 09/15/2013, 8:40 AM Office - [phone] Pager [phone]

## 2013-09-15 NOTE — Progress Notes (Signed)
Confirmed with Darlin Drop of [facility] that pt has telephonic scale service.

## 2013-09-15 NOTE — Progress Notes (Signed)
Patient evaluated for community based chronic disease management services with [facility] Care Management Program as a benefit of patient's [employer]. Spoke with patient at bedside to explain [facility] Care Management services.  Patient will receive a post discharge transition of care call and will be evaluated for monthly home visits for assessments and disease process education.  Patient lives alone and has a friend that assist with transportation to appointments and errands.  He does drive.  He has a scale at home but does not always weigh himself consistently as evidence by his greater than 10 pound fluid gain on 10.15.14 (date of admission).  He admits that he needs additional education on sodium management.  He has verified the that Dr Mirna Mires is his PCP but has not seen him for some time.  He does not drink any kind of nutritional supplements but admits that he has lost approximately 15 -20 pounds in the las 2-3 years involuntarily.  We will provide a RNCC to assess his needs and provide CHF Management.  Left contact information and [facility] literature at bedside. Made inpatient Case Manager aware that [facility] Care Management following. Of note, [facility] Care Management services does not replace or interfere with any services that are arranged by inpatient case management or social work.  For additional questions or referrals please contact Anibal Henderson BSN RN [facility] Liaison at [phone].

## 2013-09-16 LAB — GLUCOSE, CAPILLARY: Glucose-Capillary: 181 mg/dL — ABNORMAL HIGH (ref 70–99)

## 2013-09-16 NOTE — Telephone Encounter (Signed)
Patient contacted regarding discharge from [facility] on 09-15-2013.  Patient understands to follow up with provider Joni Reining, NP on 09-19-13 at 3:30 at [facility] office. Patient understands discharge instructions? Patient understands medications and regiment?  Patient understands to bring all medications to this visit?   Could not get in touch with pt. Called emergency contact. She will stop by his house this afternoon to let him know we called. Emergency contact states he will call office back once she sees him.

## 2013-09-17 NOTE — Telephone Encounter (Signed)
This nurse tried to call pt back, no answer for [facility]

## 2013-09-18 ENCOUNTER — Telehealth: Payer: Self-pay | Admitting: Cardiology

## 2013-09-18 NOTE — Telephone Encounter (Signed)
Dr Hochrein aware 

## 2013-09-18 NOTE — Telephone Encounter (Signed)
New problem:  Byrd Hesselbach states the pt refused his CBC and BMET earlier today. Pt requests to be discharged from home health. Byrd Hesselbach states [facility] is discharging him from home health services.. Nursing/Physical therapy.Marland KitchenMarland KitchenMarland Kitchen

## 2013-09-19 ENCOUNTER — Ambulatory Visit (INDEPENDENT_AMBULATORY_CARE_PROVIDER_SITE_OTHER): Payer: Medicare Other | Admitting: Adult Health

## 2013-09-19 ENCOUNTER — Encounter: Payer: Self-pay | Admitting: Adult Health

## 2013-09-19 VITALS — BP 145/57 | HR 74 | Ht 66.0 in | Wt 143.0 lb

## 2013-09-19 DIAGNOSIS — I509 Heart failure, unspecified: Secondary | ICD-10-CM

## 2013-09-19 DIAGNOSIS — I359 Nonrheumatic aortic valve disorder, unspecified: Secondary | ICD-10-CM

## 2013-09-19 DIAGNOSIS — I350 Nonrheumatic aortic (valve) stenosis: Secondary | ICD-10-CM

## 2013-09-19 DIAGNOSIS — I10 Essential (primary) hypertension: Secondary | ICD-10-CM

## 2013-09-19 DIAGNOSIS — I5032 Chronic diastolic (congestive) heart failure: Secondary | ICD-10-CM

## 2013-09-19 NOTE — Patient Instructions (Addendum)
Your physician recommends that you schedule a follow-up appointment in: 6 months with Dr McDowell You will receive a reminder letter two months in advance reminding you to call and schedule your appointment. If you don't receive this letter, please contact our office.  Your physician recommends that you continue on your current medications as directed. Please refer to the Current Medication list given to you today.   

## 2013-09-19 NOTE — Assessment & Plan Note (Signed)
Excellent control of BP on current medication regimen. He is not dizzy and is tolerating the medications without nausea.

## 2013-09-19 NOTE — Assessment & Plan Note (Addendum)
Significant holosystolic  murmur is noted. He offers no complaints of DOE or chest pain. Will continue BB, and low dose lasix. See him again in 6 months unless symptomatic.

## 2013-09-19 NOTE — Assessment & Plan Note (Signed)
He is without evidence of fluid overload. There is no evidence of LEE or abdominal distention. He is avoiding salt. He is eating better and has gained some weight which he is trying to do. I will continue him on his medications as directed. He will follow up in 6 months unless symptomatic.

## 2013-09-19 NOTE — Progress Notes (Signed)
HPI: [name] is a 79 year old patient of Dr. Purvis Sheffield, we are following for ongoing assessment and management after hospitalization for recent non-ST elevation MI, managed medically, with history of a GI bleed and AVMs. A hospitalization the patient had respiratory failure which required BiPAP. He is diuresed. Patient was given a blood transfusion.    [name] is doing well and is without complaints of DOE, chest pain or edema. His appetite is back. He is watching his salt. He is medically compliant.  No Known Allergies  Current Outpatient Prescriptions  Medication Sig Dispense Refill  . allopurinol (ZYLOPRIM) 100 MG tablet Take 100 mg by mouth daily.        Marland Kitchen amLODipine (NORVASC) 10 MG tablet Take 1 tablet (10 mg total) by mouth daily.  30 tablet  11  . atorvastatin (LIPITOR) 40 MG tablet Take 1 tablet (40 mg total) by mouth daily at 6 PM.  30 tablet  1  . furosemide (LASIX) 40 MG tablet Take 1 tablet (40 mg total) by mouth 2 (two) times daily.  60 tablet  3  . hydrALAZINE (APRESOLINE) 50 MG tablet Take 1 tablet (50 mg total) by mouth 3 (three) times daily.  90 tablet  1  . insulin glargine (LANTUS) 100 UNIT/ML injection Inject 24-28 Units into the skin daily. 24 units if blood sugar is below 150 and 28 units if blood sugar is above 150.      . metoprolol (LOPRESSOR) 50 MG tablet Take 1 tablet (50 mg total) by mouth 2 (two) times daily.  60 tablet  1  . pantoprazole (PROTONIX) 40 MG tablet Take 1 tablet (40 mg total) by mouth 2 (two) times daily.  60 tablet  2  . potassium chloride SA (K-DUR,KLOR-CON) 20 MEQ tablet Take 1 tablet (20 mEq total) by mouth daily.  30 tablet  11   No current facility-administered medications for this visit.    Past Medical History  Diagnosis Date  . Diabetes mellitus   . Hypertension   . Arthritis   . Prostate cancer   . Gout   . Coronary artery disease     a. presumed, s/p NSTEMI 07/2013-->Med Rx  . GERD (gastroesophageal reflux disease)   .  CKD (chronic kidney disease), stage III   . Iron deficiency anemia     a. 07/2013 s/p multiple PRBC's.  . Heme positive stool   . Moderate to severe aortic stenosis     a.  07/2013 Echo: EF50-55%, no rwma, mod-sev AS (0.78cm^2 VTI, 0.73cm^2 Vmax), mild AI, Mild MR, mod dil LA, PASP    Past Surgical History  Procedure Laterality Date  . Prostate surgery    . Umbilical hernia repair    . Carotid endarterectomy    . Esophagogastroduodenoscopy N/A 08/20/2013    Hung:Multiple nonbleeding gastric AVMs s/p APC.    ROS: Review of systems complete and found to be negative unless listed above  PHYSICAL EXAM BP 145/57  Pulse 74  Ht 5\' 6"  (1.676 m)  Wt 143 lb (64.864 kg)  BMI 23.09 kg/m2  General: Well developed, well nourished, in no acute distress Head: Eyes PERRLA, No xanthomas.   Normal cephalic and atramatic  Lungs: Clear bilaterally to auscultation and percussion. Heart: HRRR S1 S2, 2/6 systolic murmur, radiating to the carotids bilaterally, heard in both the RSB LSB and Apex.  Pulses are 2+ & equal.            No carotid bruit. No JVD.  No  abdominal bruits. No femoral bruits. Abdomen: Bowel sounds are positive, abdomen soft and non-tender without masses or                  Hernia's noted. Msk:  Back normal, normal gait. Normal strength and tone for age. Extremities: No clubbing, cyanosis or edema.  DP +1 Neuro: Alert and oriented X 3. Hard of hearing. Psych:  Good affect, responds appropriately    ASSESSMENT AND PLAN

## 2013-09-19 NOTE — Progress Notes (Deleted)
Name: [name]    DOB: 1920-08-09  Age: 79 y.o.  MR#: [id number]       PCP:  Evlyn Courier, MD      Insurance: Payor: MEDICARE / Plan: MEDICARE PART A AND B / Product Type: *No Product type* /   CC:   No chief complaint on file.   VS Filed Vitals:   09/19/13 1516  BP: 145/57  Pulse: 74  Height: 5\' 6"  (1.676 m)  Weight: 143 lb (64.864 kg)    Weights Current Weight  09/19/13 143 lb (64.864 kg)  09/15/13 136 lb 14.5 oz (62.1 kg)  08/19/13 146 lb 2.6 oz (66.3 kg)    Blood Pressure  BP Readings from Last 3 Encounters:  09/19/13 145/57  09/15/13 137/59  08/21/13 151/50     Admit date:  (Not on file) Last encounter with RMR:  09/09/2013   Allergy Review of patient's allergies indicates no known allergies.  Current Outpatient Prescriptions  Medication Sig Dispense Refill  . allopurinol (ZYLOPRIM) 100 MG tablet Take 100 mg by mouth daily.        Marland Kitchen amLODipine (NORVASC) 10 MG tablet Take 1 tablet (10 mg total) by mouth daily.  30 tablet  11  . atorvastatin (LIPITOR) 40 MG tablet Take 1 tablet (40 mg total) by mouth daily at 6 PM.  30 tablet  1  . furosemide (LASIX) 40 MG tablet Take 1 tablet (40 mg total) by mouth 2 (two) times daily.  60 tablet  3  . hydrALAZINE (APRESOLINE) 50 MG tablet Take 1 tablet (50 mg total) by mouth 3 (three) times daily.  90 tablet  1  . insulin glargine (LANTUS) 100 UNIT/ML injection Inject 24-28 Units into the skin daily. 24 units if blood sugar is below 150 and 28 units if blood sugar is above 150.      . metoprolol (LOPRESSOR) 50 MG tablet Take 1 tablet (50 mg total) by mouth 2 (two) times daily.  60 tablet  1  . pantoprazole (PROTONIX) 40 MG tablet Take 1 tablet (40 mg total) by mouth 2 (two) times daily.  60 tablet  2  . potassium chloride SA (K-DUR,KLOR-CON) 20 MEQ tablet Take 1 tablet (20 mEq total) by mouth daily.  30 tablet  11   No current facility-administered medications for this visit.    Discontinued Meds:   There are no discontinued  medications.  Patient Active Problem List   Diagnosis Date Noted  . Protein-calorie malnutrition, severe 09/13/2013  . Acute on chronic diastolic CHF (congestive heart failure), NYHA class 4 09/11/2013  . Elevated troponin 08/18/2013  . Acute blood loss anemia 08/16/2013  . Chronic diastolic CHF (congestive heart failure) 08/16/2013  . GI bleed 08/16/2013  . Iron deficiency anemia 08/16/2013  . History of non-ST elevation myocardial infarction (NSTEMI) 08/16/2013  . Rotavirus enteritis 08/07/2013  . NSTEMI (non-ST elevated myocardial infarction) 08/07/2013  . Acute and chronic respiratory failure w/ pulmonary edema 08/06/2013  . Acute diastolic CHF (congestive heart failure) 08/06/2013  . Metabolic acidosis 08/06/2013  . CKD (chronic kidney disease) stage 3, GFR 30-59 ml/min 09/06/2012  . GERD (gastroesophageal reflux disease) 09/06/2012  . Iron deficiency 09/06/2012  . DM (diabetes mellitus) type II uncontrolled with renal manifestation 09/06/2012  . Aortic stenosis 08/07/2012  . UTI (urinary tract infection) 08/04/2012  . Dehydration 08/04/2012  . Sepsis 08/04/2012  . MI, acute, non ST segment elevation 08/04/2012  . Acute renal failure 08/04/2012  . Hyponatremia 08/04/2012  . Hypokalemia 08/04/2012  .  Anemia 08/04/2012  . HTN (hypertension) 08/04/2012  . Gout 08/04/2012    LABS    Component Value Date/Time   NA 139 09/13/2013 0830   NA 135 09/12/2013 1159   NA 134* 09/10/2013 2334   K 3.3* 09/13/2013 0830   K 3.4* 09/12/2013 1159   K 3.8 09/10/2013 2334   CL 102 09/13/2013 0830   CL 101 09/12/2013 1159   CL 104 09/10/2013 2334   CO2 24 09/13/2013 0830   CO2 23 09/12/2013 1159   CO2 19 08/20/2013 0551   GLUCOSE 155* 09/13/2013 0830   GLUCOSE 200* 09/12/2013 1159   GLUCOSE 324* 09/10/2013 2334   BUN 26* 09/13/2013 0830   BUN 26* 09/12/2013 1159   BUN 18 09/10/2013 2334   CREATININE 1.69* 09/13/2013 0830   CREATININE 1.68* 09/12/2013 1159   CREATININE 1.90*  09/10/2013 2334   CALCIUM 9.7 09/13/2013 0830   CALCIUM 9.5 09/12/2013 1159   CALCIUM 9.2 08/20/2013 0551   GFRNONAA 34* 09/13/2013 0830   GFRNONAA 34* 09/12/2013 1159   GFRNONAA 47* 08/20/2013 0551   GFRAA 39* 09/13/2013 0830   GFRAA 39* 09/12/2013 1159   GFRAA 54* 08/20/2013 0551   CMP     Component Value Date/Time   NA 139 09/13/2013 0830   K 3.3* 09/13/2013 0830   CL 102 09/13/2013 0830   CO2 24 09/13/2013 0830   GLUCOSE 155* 09/13/2013 0830   BUN 26* 09/13/2013 0830   CREATININE 1.69* 09/13/2013 0830   CALCIUM 9.7 09/13/2013 0830   PROT 6.8 08/16/2013 1325   ALBUMIN 3.1* 08/16/2013 1325   AST 19 08/16/2013 1325   ALT 12 08/16/2013 1325   ALKPHOS 63 08/16/2013 1325   BILITOT <0.1* 08/16/2013 1325   GFRNONAA 34* 09/13/2013 0830   GFRAA 39* 09/13/2013 0830       Component Value Date/Time   WBC 5.4 09/12/2013 2228   WBC 4.6 09/12/2013 1159   WBC 9.8 09/10/2013 2323   HGB 7.8* 09/12/2013 2228   HGB 6.5* 09/12/2013 1159   HGB 9.2* 09/10/2013 2334   HCT 24.1* 09/12/2013 2228   HCT 21.2* 09/12/2013 1159   HCT 27.0* 09/10/2013 2334   MCV 82.5 09/12/2013 2228   MCV 82.8 09/12/2013 1159   MCV 83.2 09/10/2013 2323    Lipid Panel  No results found for this basename: chol, trig, hdl, cholhdl, vldl, ldlcalc    ABG    Component Value Date/Time   PHART 7.333* 09/10/2013 2339   PCO2ART 32.9* 09/10/2013 2339   PO2ART 116.0* 09/10/2013 2339   HCO3 17.5* 09/10/2013 2339   TCO2 18 09/10/2013 2339   ACIDBASEDEF 8.0* 09/10/2013 2339   O2SAT 98.0 09/10/2013 2339     Lab Results  Component Value Date   TSH 1.035 08/06/2013   BNP (last 3 results)  Recent Labs  08/06/13 0321 09/10/13 2323  PROBNP 222.8 1262.0*   Cardiac Panel (last 3 results) No results found for this basename: CKTOTAL, CKMB, TROPONINI, RELINDX,  in the last 72 hours  Iron/TIBC/Ferritin    Component Value Date/Time   IRON <10* 08/05/2012 0545   TIBC Not calculated due to Iron <10. 08/05/2012 0545    FERRITIN 45 08/05/2012 0545     EKG Orders placed during the hospital encounter of 09/10/13  . EKG 12-LEAD  . EKG 12-LEAD  . EKG 12-LEAD  . EKG 12-LEAD  . EKG     Prior Assessment and Plan Problem List as of 09/19/2013   CKD (chronic kidney disease) stage 3,  GFR 30-59 ml/min   GERD (gastroesophageal reflux disease)   Iron deficiency   DM (diabetes mellitus) type II uncontrolled with renal manifestation   Chronic diastolic CHF (congestive heart failure)   Iron deficiency anemia   History of non-ST elevation myocardial infarction (NSTEMI)   UTI (urinary tract infection)   Dehydration   Sepsis   MI, acute, non ST segment elevation   Acute renal failure   Hyponatremia   Hypokalemia   Anemia   HTN (hypertension)   Gout   Aortic stenosis   Acute and chronic respiratory failure w/ pulmonary edema   Acute diastolic CHF (congestive heart failure)   Metabolic acidosis   Rotavirus enteritis   NSTEMI (non-ST elevated myocardial infarction)   Acute blood loss anemia   GI bleed   Elevated troponin   Acute on chronic diastolic CHF (congestive heart failure), NYHA class 4   Protein-calorie malnutrition, severe       Imaging: Dg Chest Portable 1 View  09/11/2013   CLINICAL DATA:  Respiratory distress and shortness of breath  EXAM: PORTABLE CHEST - 1 VIEW  COMPARISON:  08/16/2013  FINDINGS: Diffuse interstitial and airspace opacities. There is some sparing in the right mid lung - no emphysematous change in this region on chest CT 08/06/2012. Probable small pleural effusions. No cardiomegaly. Aortic atherosclerosis. No pneumothorax.  IMPRESSION: Pulmonary edema with pleural effusions. Sparing in the right mid lung of uncertain cause or significance.   Electronically Signed   By: Tiburcio Pea M.D.   On: 09/11/2013 00:27

## 2013-10-08 ENCOUNTER — Ambulatory Visit (INDEPENDENT_AMBULATORY_CARE_PROVIDER_SITE_OTHER): Payer: Medicare Other | Admitting: Podiatry

## 2013-10-08 ENCOUNTER — Encounter: Payer: Self-pay | Admitting: Podiatry

## 2013-10-08 VITALS — BP 128/49 | HR 72 | Resp 24 | Ht 65.0 in | Wt 147.0 lb

## 2013-10-08 DIAGNOSIS — M79609 Pain in unspecified limb: Secondary | ICD-10-CM

## 2013-10-08 DIAGNOSIS — B351 Tinea unguium: Secondary | ICD-10-CM

## 2013-10-08 NOTE — Progress Notes (Signed)
Patient ID: [name], male   DOB: 04/10/1920, 79 y.o.   MRN: [id number]  Subjective: This 79 year old white male presents for ongoing debridement of painful mycotic toenails at approximately three-month intervals. He is a type II diabetic with symptoms of peripheral arterial disease and diabetic peripheral neuropathy.  Objective: Elongated, hypertrophic, discolored toenails x10 with palpable tenderness in all nail plates.  Assessment: Symptomatic onychomycoses x10 Peripheral arterial disease Diabetic peripheral neuropathy  Plan: All 10 toenails debrided back without any bleeding. Reappoint intervals at 3 months.

## 2013-10-08 NOTE — Patient Instructions (Signed)
Remove bandage on third left toe in 24 hours and apply topical antibiotic to the third left toe for an additional 2 days.

## 2013-10-19 ENCOUNTER — Emergency Department (HOSPITAL_COMMUNITY): Payer: Medicare Other

## 2013-10-19 ENCOUNTER — Inpatient Hospital Stay (HOSPITAL_COMMUNITY): Payer: Medicare Other

## 2013-10-19 ENCOUNTER — Encounter (HOSPITAL_COMMUNITY): Payer: Self-pay | Admitting: Emergency Medicine

## 2013-10-19 ENCOUNTER — Inpatient Hospital Stay (HOSPITAL_COMMUNITY)
Admission: EM | Admit: 2013-10-19 | Discharge: 2013-10-28 | DRG: 280 | Disposition: A | Discharge status: Hospice | Payer: Medicare Other | Attending: Family Medicine | Admitting: Family Medicine

## 2013-10-19 DIAGNOSIS — E872 Acidosis, unspecified: Secondary | ICD-10-CM | POA: Diagnosis present

## 2013-10-19 DIAGNOSIS — Z515 Encounter for palliative care: Secondary | ICD-10-CM

## 2013-10-19 DIAGNOSIS — N179 Acute kidney failure, unspecified: Secondary | ICD-10-CM

## 2013-10-19 DIAGNOSIS — J9690 Respiratory failure, unspecified, unspecified whether with hypoxia or hypercapnia: Secondary | ICD-10-CM

## 2013-10-19 DIAGNOSIS — Z8546 Personal history of malignant neoplasm of prostate: Secondary | ICD-10-CM

## 2013-10-19 DIAGNOSIS — D649 Anemia, unspecified: Secondary | ICD-10-CM

## 2013-10-19 DIAGNOSIS — E43 Unspecified severe protein-calorie malnutrition: Secondary | ICD-10-CM

## 2013-10-19 DIAGNOSIS — J189 Pneumonia, unspecified organism: Secondary | ICD-10-CM | POA: Diagnosis present

## 2013-10-19 DIAGNOSIS — D638 Anemia in other chronic diseases classified elsewhere: Secondary | ICD-10-CM | POA: Diagnosis present

## 2013-10-19 DIAGNOSIS — I2510 Atherosclerotic heart disease of native coronary artery without angina pectoris: Secondary | ICD-10-CM | POA: Diagnosis present

## 2013-10-19 DIAGNOSIS — E41 Nutritional marasmus: Secondary | ICD-10-CM | POA: Diagnosis present

## 2013-10-19 DIAGNOSIS — E1165 Type 2 diabetes mellitus with hyperglycemia: Secondary | ICD-10-CM | POA: Diagnosis present

## 2013-10-19 DIAGNOSIS — J9620 Acute and chronic respiratory failure, unspecified whether with hypoxia or hypercapnia: Secondary | ICD-10-CM

## 2013-10-19 DIAGNOSIS — D509 Iron deficiency anemia, unspecified: Secondary | ICD-10-CM

## 2013-10-19 DIAGNOSIS — I359 Nonrheumatic aortic valve disorder, unspecified: Secondary | ICD-10-CM | POA: Diagnosis present

## 2013-10-19 DIAGNOSIS — I129 Hypertensive chronic kidney disease with stage 1 through stage 4 chronic kidney disease, or unspecified chronic kidney disease: Secondary | ICD-10-CM | POA: Diagnosis present

## 2013-10-19 DIAGNOSIS — I214 Non-ST elevation (NSTEMI) myocardial infarction: Secondary | ICD-10-CM | POA: Diagnosis present

## 2013-10-19 DIAGNOSIS — I10 Essential (primary) hypertension: Secondary | ICD-10-CM

## 2013-10-19 DIAGNOSIS — N19 Unspecified kidney failure: Secondary | ICD-10-CM

## 2013-10-19 DIAGNOSIS — E1129 Type 2 diabetes mellitus with other diabetic kidney complication: Secondary | ICD-10-CM | POA: Diagnosis present

## 2013-10-19 DIAGNOSIS — N058 Unspecified nephritic syndrome with other morphologic changes: Secondary | ICD-10-CM | POA: Diagnosis present

## 2013-10-19 DIAGNOSIS — D62 Acute posthemorrhagic anemia: Secondary | ICD-10-CM

## 2013-10-19 DIAGNOSIS — R7989 Other specified abnormal findings of blood chemistry: Secondary | ICD-10-CM

## 2013-10-19 DIAGNOSIS — R0600 Dyspnea, unspecified: Secondary | ICD-10-CM

## 2013-10-19 DIAGNOSIS — IMO0002 Reserved for concepts with insufficient information to code with codable children: Secondary | ICD-10-CM | POA: Diagnosis present

## 2013-10-19 DIAGNOSIS — K219 Gastro-esophageal reflux disease without esophagitis: Secondary | ICD-10-CM | POA: Diagnosis present

## 2013-10-19 DIAGNOSIS — I509 Heart failure, unspecified: Secondary | ICD-10-CM

## 2013-10-19 DIAGNOSIS — Z794 Long term (current) use of insulin: Secondary | ICD-10-CM

## 2013-10-19 DIAGNOSIS — N184 Chronic kidney disease, stage 4 (severe): Secondary | ICD-10-CM

## 2013-10-19 DIAGNOSIS — D500 Iron deficiency anemia secondary to blood loss (chronic): Secondary | ICD-10-CM | POA: Diagnosis present

## 2013-10-19 DIAGNOSIS — K922 Gastrointestinal hemorrhage, unspecified: Secondary | ICD-10-CM

## 2013-10-19 DIAGNOSIS — Z66 Do not resuscitate: Secondary | ICD-10-CM | POA: Diagnosis present

## 2013-10-19 DIAGNOSIS — I350 Nonrheumatic aortic (valve) stenosis: Secondary | ICD-10-CM | POA: Diagnosis present

## 2013-10-19 DIAGNOSIS — M109 Gout, unspecified: Secondary | ICD-10-CM | POA: Diagnosis present

## 2013-10-19 DIAGNOSIS — Z7902 Long term (current) use of antithrombotics/antiplatelets: Secondary | ICD-10-CM

## 2013-10-19 DIAGNOSIS — I5041 Acute combined systolic (congestive) and diastolic (congestive) heart failure: Secondary | ICD-10-CM

## 2013-10-19 DIAGNOSIS — I5031 Acute diastolic (congestive) heart failure: Secondary | ICD-10-CM

## 2013-10-19 DIAGNOSIS — Z8249 Family history of ischemic heart disease and other diseases of the circulatory system: Secondary | ICD-10-CM

## 2013-10-19 DIAGNOSIS — M129 Arthropathy, unspecified: Secondary | ICD-10-CM | POA: Diagnosis present

## 2013-10-19 DIAGNOSIS — J9600 Acute respiratory failure, unspecified whether with hypoxia or hypercapnia: Secondary | ICD-10-CM | POA: Diagnosis present

## 2013-10-19 DIAGNOSIS — I252 Old myocardial infarction: Secondary | ICD-10-CM

## 2013-10-19 DIAGNOSIS — N183 Chronic kidney disease, stage 3 unspecified: Secondary | ICD-10-CM | POA: Diagnosis present

## 2013-10-19 DIAGNOSIS — I5033 Acute on chronic diastolic (congestive) heart failure: Principal | ICD-10-CM | POA: Diagnosis present

## 2013-10-19 DIAGNOSIS — Z87891 Personal history of nicotine dependence: Secondary | ICD-10-CM

## 2013-10-19 HISTORY — DX: Rheumatic fever without heart involvement: I00

## 2013-10-19 LAB — BLOOD GAS, ARTERIAL
Acid-base deficit: 9 mmol/L — ABNORMAL HIGH (ref 0.0–2.0)
Acid-base deficit: 14.7 mmol/L — ABNORMAL HIGH (ref 0.0–2.0)
Bicarbonate: 10.9 mEq/L — ABNORMAL LOW (ref 20.0–24.0)
Drawn by: 22223
FIO2: 0.4 %
MECHVT: 500 mL
O2 Content: 3 L/min
O2 Content: 40 L/min
O2 Saturation: 89 %
O2 Saturation: 93.1 %
TCO2: 10.9 mmol/L (ref 0–100)
pCO2 arterial: 29.1 mmHg — ABNORMAL LOW (ref 35.0–45.0)
pCO2 arterial: 24.9 mmHg — ABNORMAL LOW (ref 35.0–45.0)
pH, Arterial: 7.346 — ABNORMAL LOW (ref 7.350–7.450)
pO2, Arterial: 63.6 mmHg — ABNORMAL LOW (ref 80.0–100.0)
pO2, Arterial: 81.3 mmHg (ref 80.0–100.0)

## 2013-10-19 LAB — BASIC METABOLIC PANEL
BUN: 29 mg/dL — ABNORMAL HIGH (ref 6–23)
CO2: 10 mEq/L — CL (ref 19–32)
Chloride: 101 mEq/L (ref 96–112)
Creatinine, Ser: 2.28 mg/dL — ABNORMAL HIGH (ref 0.50–1.35)
GFR calc Af Amer: 27 mL/min — ABNORMAL LOW (ref >90)
Glucose, Bld: 260 mg/dL — ABNORMAL HIGH (ref 70–99)
Potassium: 4.9 mEq/L (ref 3.5–5.1)

## 2013-10-19 LAB — GLUCOSE, CAPILLARY
Glucose-Capillary: 176 mg/dL — ABNORMAL HIGH (ref 70–99)
Glucose-Capillary: 174 mg/dL — ABNORMAL HIGH (ref 70–99)

## 2013-10-19 LAB — CBC WITH DIFFERENTIAL/PLATELET
Basophils Absolute: 0 10*3/uL (ref 0.0–0.1)
Eosinophils Absolute: 0 10*3/uL (ref 0.0–0.7)
Hemoglobin: 7.2 g/dL — ABNORMAL LOW (ref 13.0–17.0)
Lymphocytes Relative: 7 % — ABNORMAL LOW (ref 12–46)
Lymphs Abs: 0.8 10*3/uL (ref 0.7–4.0)
Monocytes Relative: 7 % (ref 3–12)
Neutrophils Relative %: 85 % — ABNORMAL HIGH (ref 43–77)
Platelets: 258 10*3/uL (ref 150–400)
RBC: 3.17 MIL/uL — ABNORMAL LOW (ref 4.22–5.81)
WBC: 11.1 10*3/uL — ABNORMAL HIGH (ref 4.0–10.5)

## 2013-10-19 LAB — PRO B NATRIURETIC PEPTIDE: Pro B Natriuretic peptide (BNP): 1806 pg/mL — ABNORMAL HIGH (ref 0–450)

## 2013-10-19 LAB — POCT I-STAT TROPONIN I: Troponin i, poc: 0.07 ng/mL (ref 0.00–0.08)

## 2013-10-19 LAB — POCT I-STAT, CHEM 8
Calcium, Ion: 1.3 mmol/L (ref 1.13–1.30)
Creatinine, Ser: 2.1 mg/dL — ABNORMAL HIGH (ref 0.50–1.35)
Glucose, Bld: 263 mg/dL — ABNORMAL HIGH (ref 70–99)
HCT: 25 % — ABNORMAL LOW (ref 39.0–52.0)
Hemoglobin: 8.5 g/dL — ABNORMAL LOW (ref 13.0–17.0)
Potassium: 3.7 mEq/L (ref 3.5–5.1)
Sodium: 141 mEq/L (ref 135–145)
TCO2: 15 mmol/L (ref 0–100)

## 2013-10-19 LAB — TROPONIN I: Troponin I: 7.14 ng/mL

## 2013-10-19 LAB — MRSA PCR SCREENING: MRSA by PCR: NEGATIVE

## 2013-10-19 LAB — PREPARE RBC (CROSSMATCH)

## 2013-10-19 MED ORDER — METOPROLOL TARTRATE 1 MG/ML IV SOLN: 5.0000 mg | Freq: Four times a day (QID) | INTRAVENOUS | Status: DC | PRN

## 2013-10-19 MED ORDER — CHLORHEXIDINE GLUCONATE 0.12 % MT SOLN
15.0000 mL | Freq: Two times a day (BID) | OROMUCOSAL | Status: DC
  Administered 2013-10-19 – 2013-10-21 (×5): 15 mL via OROMUCOSAL
  Filled 2013-10-19 (×4): qty 15

## 2013-10-19 MED ORDER — IPRATROPIUM BROMIDE 0.02 % IN SOLN
RESPIRATORY_TRACT | Status: AC
  Filled 2013-10-19: qty 2.5

## 2013-10-19 MED ORDER — ALBUTEROL SULFATE (5 MG/ML) 0.5% IN NEBU
2.5000 mg | INHALATION_SOLUTION | RESPIRATORY_TRACT | Status: DC | PRN
  Administered 2013-10-19 – 2013-10-21 (×2): 2.5 mg via RESPIRATORY_TRACT
  Filled 2013-10-19 (×2): qty 0.5

## 2013-10-19 MED ORDER — FUROSEMIDE 10 MG/ML IJ SOLN
60.0000 mg | Freq: Three times a day (TID) | INTRAMUSCULAR | Status: DC
  Administered 2013-10-19: 60 mg via INTRAVENOUS
  Filled 2013-10-19: qty 6

## 2013-10-19 MED ORDER — ALBUTEROL (5 MG/ML) CONTINUOUS INHALATION SOLN
INHALATION_SOLUTION | RESPIRATORY_TRACT | Status: AC
  Filled 2013-10-19: qty 20

## 2013-10-19 MED ORDER — ALBUTEROL SULFATE (5 MG/ML) 0.5% IN NEBU
INHALATION_SOLUTION | RESPIRATORY_TRACT | Status: AC
  Filled 2013-10-19: qty 1

## 2013-10-19 MED ORDER — SODIUM CHLORIDE 0.9 % IJ SOLN
3.0000 mL | INTRAMUSCULAR | Status: DC | PRN
  Administered 2013-10-23: 3 mL via INTRAVENOUS

## 2013-10-19 MED ORDER — SODIUM CHLORIDE 0.9 % IV SOLN
0.0000 ug/h | INTRAVENOUS | Status: DC
  Administered 2013-10-19: 5 ug/h via INTRAVENOUS
  Administered 2013-10-20: 125 ug/h via INTRAVENOUS
  Administered 2013-10-21 (×2): 150 ug/h via INTRAVENOUS
  Filled 2013-10-19 (×3): qty 50

## 2013-10-19 MED ORDER — FENTANYL BOLUS VIA INFUSION
25.0000 ug | INTRAVENOUS | Status: DC | PRN
  Filled 2013-10-19: qty 50

## 2013-10-19 MED ORDER — SODIUM BICARBONATE 8.4 % IV SOLN
INTRAVENOUS | Status: DC
  Administered 2013-10-19 – 2013-10-20 (×2): via INTRAVENOUS
  Filled 2013-10-19 (×2): qty 150

## 2013-10-19 MED ORDER — IPRATROPIUM BROMIDE 0.02 % IN SOLN
RESPIRATORY_TRACT | Status: AC
  Administered 2013-10-19: 0.5 mg via RESPIRATORY_TRACT
  Filled 2013-10-19: qty 2.5

## 2013-10-19 MED ORDER — METOPROLOL TARTRATE 50 MG PO TABS
50.0000 mg | ORAL_TABLET | Freq: Two times a day (BID) | ORAL | Status: DC
  Administered 2013-10-20 – 2013-10-28 (×9): 50 mg via ORAL
  Filled 2013-10-19 (×11): qty 1

## 2013-10-19 MED ORDER — ONDANSETRON HCL 4 MG PO TABS: 4.0000 mg | ORAL_TABLET | Freq: Four times a day (QID) | ORAL | Status: DC | PRN

## 2013-10-19 MED ORDER — IPRATROPIUM BROMIDE 0.02 % IN SOLN
0.5000 mg | Freq: Once | RESPIRATORY_TRACT | Status: AC
  Administered 2013-10-19: 0.5 mg via RESPIRATORY_TRACT

## 2013-10-19 MED ORDER — SODIUM BICARBONATE 8.4 % IV SOLN
INTRAVENOUS | Status: AC
  Filled 2013-10-19: qty 50

## 2013-10-19 MED ORDER — DEXTROSE 5 % IV SOLN
1.0000 g | INTRAVENOUS | Status: DC
  Administered 2013-10-19 – 2013-10-22 (×4): 1 g via INTRAVENOUS
  Filled 2013-10-19 (×5): qty 1

## 2013-10-19 MED ORDER — SODIUM CHLORIDE 0.9 % IJ SOLN
3.0000 mL | Freq: Two times a day (BID) | INTRAMUSCULAR | Status: DC
  Administered 2013-10-19 – 2013-10-23 (×9): 3 mL via INTRAVENOUS

## 2013-10-19 MED ORDER — ALBUTEROL SULFATE (5 MG/ML) 0.5% IN NEBU
2.5000 mg | INHALATION_SOLUTION | Freq: Four times a day (QID) | RESPIRATORY_TRACT | Status: DC
  Administered 2013-10-19 – 2013-10-22 (×12): 2.5 mg via RESPIRATORY_TRACT
  Filled 2013-10-19 (×13): qty 0.5

## 2013-10-19 MED ORDER — LEVOFLOXACIN IN D5W 750 MG/150ML IV SOLN
750.0000 mg | Freq: Once | INTRAVENOUS | Status: AC
  Administered 2013-10-19: 750 mg via INTRAVENOUS
  Filled 2013-10-19: qty 150

## 2013-10-19 MED ORDER — FUROSEMIDE 10 MG/ML IJ SOLN
INTRAMUSCULAR | Status: AC
  Filled 2013-10-19: qty 30

## 2013-10-19 MED ORDER — SODIUM BICARBONATE 8.4 % IV SOLN
INTRAVENOUS | Status: AC
  Filled 2013-10-19: qty 100

## 2013-10-19 MED ORDER — ALLOPURINOL 100 MG PO TABS
100.0000 mg | ORAL_TABLET | Freq: Every day | ORAL | Status: DC
  Administered 2013-10-20 – 2013-10-28 (×3): 100 mg via ORAL
  Filled 2013-10-19 (×5): qty 1

## 2013-10-19 MED ORDER — ACETAMINOPHEN 650 MG RE SUPP
650.0000 mg | Freq: Four times a day (QID) | RECTAL | Status: DC | PRN
  Administered 2013-10-21: 650 mg via RECTAL
  Filled 2013-10-19: qty 1

## 2013-10-19 MED ORDER — VANCOMYCIN HCL IN DEXTROSE 1-5 GM/200ML-% IV SOLN
1000.0000 mg | INTRAVENOUS | Status: DC
  Administered 2013-10-19 – 2013-10-21 (×2): 1000 mg via INTRAVENOUS
  Filled 2013-10-19 (×3): qty 200

## 2013-10-19 MED ORDER — SODIUM CHLORIDE 0.9 % IV SOLN
Freq: Once | INTRAVENOUS | Status: AC
  Administered 2013-10-19: 10 mL/h via INTRAVENOUS

## 2013-10-19 MED ORDER — ALBUTEROL (5 MG/ML) CONTINUOUS INHALATION SOLN
5.0000 mg/h | INHALATION_SOLUTION | Freq: Once | RESPIRATORY_TRACT | Status: AC
  Administered 2013-10-19: 05:00:00 via RESPIRATORY_TRACT

## 2013-10-19 MED ORDER — ONDANSETRON HCL 4 MG/2ML IJ SOLN: 4.0000 mg | Freq: Four times a day (QID) | INTRAMUSCULAR | Status: DC | PRN

## 2013-10-19 MED ORDER — HYDRALAZINE HCL 25 MG PO TABS
50.0000 mg | ORAL_TABLET | Freq: Three times a day (TID) | ORAL | Status: DC
  Administered 2013-10-20 – 2013-10-28 (×10): 50 mg via ORAL
  Filled 2013-10-19 (×12): qty 2

## 2013-10-19 MED ORDER — IPRATROPIUM BROMIDE 0.02 % IN SOLN
0.5000 mg | Freq: Four times a day (QID) | RESPIRATORY_TRACT | Status: DC
  Administered 2013-10-19 – 2013-10-22 (×12): 0.5 mg via RESPIRATORY_TRACT
  Filled 2013-10-19 (×13): qty 2.5

## 2013-10-19 MED ORDER — ATORVASTATIN CALCIUM 40 MG PO TABS
40.0000 mg | ORAL_TABLET | Freq: Every day | ORAL | Status: DC
  Administered 2013-10-20: 40 mg via ORAL
  Filled 2013-10-19: qty 1

## 2013-10-19 MED ORDER — SODIUM BICARBONATE 8.4 % IV SOLN: INTRAVENOUS | Status: DC

## 2013-10-19 MED ORDER — SODIUM CHLORIDE 0.9 % IV SOLN
250.0000 mL | INTRAVENOUS | Status: DC | PRN
  Administered 2013-10-23: 500 mL via INTRAVENOUS

## 2013-10-19 MED ORDER — BIOTENE DRY MOUTH MT LIQD
15.0000 mL | Freq: Four times a day (QID) | OROMUCOSAL | Status: DC
  Administered 2013-10-19 – 2013-10-21 (×8): 15 mL via OROMUCOSAL

## 2013-10-19 MED ORDER — ASPIRIN EC 81 MG PO TBEC
81.0000 mg | DELAYED_RELEASE_TABLET | Freq: Every day | ORAL | Status: DC
  Filled 2013-10-19 (×2): qty 1

## 2013-10-19 MED ORDER — FENTANYL CITRATE 0.05 MG/ML IJ SOLN
50.0000 ug | Freq: Once | INTRAMUSCULAR | Status: DC
Start: 2013-10-19 — End: 2013-10-21

## 2013-10-19 MED ORDER — INSULIN ASPART 100 UNIT/ML ~~LOC~~ SOLN
0.0000 [IU] | SUBCUTANEOUS | Status: DC
  Administered 2013-10-19: 5 [IU] via SUBCUTANEOUS
  Administered 2013-10-19: 3 [IU] via SUBCUTANEOUS
  Administered 2013-10-20: 2 [IU] via SUBCUTANEOUS
  Administered 2013-10-20: 3 [IU] via SUBCUTANEOUS
  Administered 2013-10-20: 2 [IU] via SUBCUTANEOUS
  Administered 2013-10-20: 3 [IU] via SUBCUTANEOUS
  Administered 2013-10-21: 2 [IU] via SUBCUTANEOUS
  Administered 2013-10-21 (×2): 3 [IU] via SUBCUTANEOUS
  Administered 2013-10-21 – 2013-10-22 (×3): 2 [IU] via SUBCUTANEOUS

## 2013-10-19 MED ORDER — FENTANYL CITRATE 0.05 MG/ML IJ SOLN
50.0000 ug | INTRAMUSCULAR | Status: DC | PRN
  Administered 2013-10-19 (×2): 50 ug via INTRAVENOUS
  Filled 2013-10-19 (×2): qty 2

## 2013-10-19 MED ORDER — ACETAMINOPHEN 325 MG PO TABS: 650.0000 mg | ORAL_TABLET | Freq: Four times a day (QID) | ORAL | Status: DC | PRN

## 2013-10-19 MED ORDER — TECHNETIUM TO 99M ALBUMIN AGGREGATED
6.0000 | Freq: Once | INTRAVENOUS | Status: AC | PRN
  Administered 2013-10-19: 6 via INTRAVENOUS

## 2013-10-19 MED ORDER — ALBUTEROL SULFATE (5 MG/ML) 0.5% IN NEBU
5.0000 mg | INHALATION_SOLUTION | Freq: Once | RESPIRATORY_TRACT | Status: AC
  Administered 2013-10-19: 5 mg via RESPIRATORY_TRACT

## 2013-10-19 MED ORDER — MORPHINE SULFATE 2 MG/ML IJ SOLN
1.0000 mg | INTRAMUSCULAR | Status: DC | PRN
  Administered 2013-10-22 – 2013-10-23 (×9): 1 mg via INTRAVENOUS
  Filled 2013-10-19 (×10): qty 1

## 2013-10-19 MED ORDER — FUROSEMIDE 10 MG/ML IJ SOLN
40.0000 mg | Freq: Once | INTRAMUSCULAR | Status: AC
  Administered 2013-10-19: 40 mg via INTRAVENOUS
  Filled 2013-10-19: qty 4

## 2013-10-19 MED ORDER — NITROGLYCERIN IN D5W 200-5 MCG/ML-% IV SOLN
2.0000 ug/min | INTRAVENOUS | Status: DC
  Administered 2013-10-19: 10 ug/min via INTRAVENOUS

## 2013-10-19 MED ORDER — FENTANYL CITRATE 0.05 MG/ML IJ SOLN
50.0000 ug | INTRAMUSCULAR | Status: DC | PRN
  Administered 2013-10-19: 50 ug via INTRAVENOUS

## 2013-10-19 MED ORDER — PANTOPRAZOLE SODIUM 40 MG IV SOLR
40.0000 mg | Freq: Two times a day (BID) | INTRAVENOUS | Status: DC
  Administered 2013-10-19 – 2013-10-22 (×7): 40 mg via INTRAVENOUS
  Filled 2013-10-19 (×7): qty 40

## 2013-10-19 MED ORDER — NITROGLYCERIN IN D5W 200-5 MCG/ML-% IV SOLN
5.0000 ug/min | Freq: Once | INTRAVENOUS | Status: AC
  Administered 2013-10-19: 5 ug/min via INTRAVENOUS
  Filled 2013-10-19: qty 250

## 2013-10-19 MED ORDER — FUROSEMIDE 10 MG/ML IJ SOLN
8.0000 mg/h | INTRAVENOUS | Status: DC
  Administered 2013-10-19: 4 mg/h via INTRAVENOUS
  Administered 2013-10-21: 8 mg/h via INTRAVENOUS
  Filled 2013-10-19 (×3): qty 25

## 2013-10-19 MED ORDER — HEPARIN SODIUM (PORCINE) 5000 UNIT/ML IJ SOLN
5000.0000 [IU] | Freq: Three times a day (TID) | INTRAMUSCULAR | Status: DC
  Administered 2013-10-19 – 2013-10-22 (×10): 5000 [IU] via SUBCUTANEOUS
  Filled 2013-10-19 (×10): qty 1

## 2013-10-19 NOTE — ED Notes (Signed)
Patient encourage to stop talking with his family, conserve his 75.  He states he feels like he has "phleghm" stuck in his chest and can't get it up.   Notified RT to evaluate patient again.  Currently on 02 via cannula

## 2013-10-19 NOTE — Progress Notes (Signed)
Was called to see patient for respiratory distress.  On my arrival, patient increased work of breathing. His oxygen saturations are noted to be in the high 80s on 4 L. The patient did have audible wheeze as well as bilateral crackles. Albuterol nebulization treatment was ordered as well as BiPAP therapy. Patient had a respiratory rate in 30s. Did not experience significant benefit with albuterol treatment. BiPAP was applied and it did not appear that patient was tolerating this. He still had increased work of breathing. The patient clearly stated on admission that he would want all measures taken to prolong his life. This was also confirmed with his daughter Claris Gower over the phone. Decision was made to intubate the patient for respiratory failure. Each physician Dr. Adriana Simas was called to intubate the patient. Will consult Dr. Juanetta Gosling for assistance with medical management. Continue management of CHF with IV Lasix.  MEMON,JEHANZEB

## 2013-10-19 NOTE — ED Notes (Signed)
Gershom Brobeck is the pt's daughter from out of town, number to call is [phone]. Pt's neighbor and friend is to be called as well Alvie Heidelberg [phone].

## 2013-10-19 NOTE — H&P (Signed)
[facility] History and Physical  [name] BJY:[id number] DOB: 1920-03-07 DOA: 10/19/2013  Referring physician: Dr. Adriana Hampton, ER physician PCP: Duane Courier, MD  Specialists: Cardiologist: Dr. Purvis Hampton  Chief Complaint: shortness of breath  HPI: [name] is a 79 y.o. male with history of diastolic congestive heart failure, coronary artery disease, severe aortic stenosis, chronic kidney disease stage III and GI bleeding from gastric AVMs. Patient was recently admitted to the hospital for acute on chronic congestive heart failure and was discharged on 09/15/13. His weight at the time of discharge was noted to be 136 pounds. He reportedly felt well and did not have any shortness of breath. The patient is not the best historian and therefore it history is somewhat limited. He reports that over the last few weeks he has noticed a worsening shortness of breath. Increasing dyspnea on exertion. He describes orthopnea. Denies any chest pain. He felt that he may have been feverish last night. Does not report any cough. This morning he was brought to the emergency room in respiratory distress requiring BiPAP therapy. His chest x-ray indicates pulmonary edema and congestive heart failure. EKG showed anterolateral ST depressions with T-wave inversions. Serum troponin was mildly elevated at 0.16. Patient was given one dose of IV Lasix with good effect. He was weaned down to nasal cannula and felt symptomatic improvement. Was also noted that he was anemic with a hemoglobin in the sevens. He was transfused 2 units PRBCs. Case was discussed with cardiology at [facility] and arrangements were being made for admission to [facility] Currently, step down beds are not available at [facility] and therefore admission has been requested at [facility].  Review of Systems: Pertinent positives as per HPI, otherwise negative  Past Medical History  Diagnosis Date  . Diabetes mellitus   .  Hypertension   . Arthritis   . Prostate cancer   . Gout   . Coronary artery disease     a. presumed, s/p NSTEMI 07/2013-->Med Rx  . GERD (gastroesophageal reflux disease)   . CKD (chronic kidney disease), stage III   . Iron deficiency anemia     a. 07/2013 s/p multiple PRBC's.  . Heme positive stool   . Moderate to severe aortic stenosis     a.  07/2013 Echo: EF50-55%, no rwma, mod-sev AS (0.78cm^2 VTI, 0.73cm^2 Vmax), mild AI, Mild MR, mod dil LA, PASP   Past Surgical History  Procedure Laterality Date  . Prostate surgery    . Umbilical hernia repair    . Carotid endarterectomy    . Esophagogastroduodenoscopy N/A 08/20/2013    Hung:Multiple nonbleeding gastric AVMs s/p APC.   Social History:  reports that he has quit smoking. He does not have any smokeless tobacco history on file. He reports that he does not drink alcohol or use illicit drugs.   No Known Allergies  Family History  Problem Relation Age of Onset  . Cancer Mother     Deceased  . Heart failure Sister     Deceased  . Heart failure Brother     Deceased  . Hypertension Mother     Deceased  . Hypertension Father      Prior to Admission medications   Medication Sig Start Date End Date Taking? Authorizing Provider  allopurinol (ZYLOPRIM) 100 MG tablet Take 100 mg by mouth daily.     Yes Historical Provider, MD  amLODipine (NORVASC) 10 MG tablet Take 1 tablet (10 mg total) by mouth daily. 09/15/13  Yes Duane G Barrett, PA-C  atorvastatin (LIPITOR) 40 MG tablet Take 1 tablet (40 mg total) by mouth daily at 6 PM. 08/11/13  Yes Duane Blinks, MD  furosemide (LASIX) 40 MG tablet Take 1 tablet (40 mg total) by mouth 2 (two) times daily. 09/15/13  Yes Duane G Barrett, PA-C  hydrALAZINE (APRESOLINE) 50 MG tablet Take 1 tablet (50 mg total) by mouth 3 (three) times daily. 08/11/13  Yes Duane Blinks, MD  insulin glargine (LANTUS) 100 UNIT/ML injection Inject 24-28 Units into the skin daily. 24 units if blood sugar  is below 150 and 28 units if blood sugar is above 150.   Yes Historical Provider, MD  metoprolol (LOPRESSOR) 50 MG tablet Take 1 tablet (50 mg total) by mouth 2 (two) times daily. 08/11/13  Yes Duane Blinks, MD  pantoprazole (PROTONIX) 40 MG tablet Take 40 mg by mouth 2 (two) times daily.   Yes Historical Provider, MD  potassium chloride SA (K-DUR,KLOR-CON) 20 MEQ tablet Take 1 tablet (20 mEq total) by mouth daily. 09/15/13  Yes Duane G Barrett, PA-C  clopidogrel (PLAVIX) 75 MG tablet Take 75 mg by mouth daily with breakfast.    Historical Provider, MD   Physical Exam: Filed Vitals:   10/19/13 0945  BP: 141/50  Pulse: 88  Temp:   Resp: 25     General:  NAD  Eyes: S1, S2 RRR  ENT: mucous membranes are moist  Neck: supple  Cardiovascular: s1, s2, rrr, holosystolic murmur  Respiratory: crackles at bases  Abdomen: soft, nt, bs+, rectal exam performed which did not show any stool in rectal vault. No signs of melena or hematochezia. Hemoccult was found to be negative.  Skin: no rashes  Musculoskeletal: trace edema in Le bilaterally  Psychiatric: normal affect, cooperative with exam  Neurologic: grossly intact, non focal  Labs on Admission:  Basic Metabolic Panel:  Recent Labs Lab 10/19/13 0539  NA 141  K 3.7  CL 110  GLUCOSE 263*  BUN 24*  CREATININE 2.10*   Liver Function Tests: No results found for this basename: AST, ALT, ALKPHOS, BILITOT, PROT, ALBUMIN,  in the last 168 hours No results found for this basename: LIPASE, AMYLASE,  in the last 168 hours No results found for this basename: AMMONIA,  in the last 168 hours CBC:  Recent Labs Lab 10/19/13 0539 10/19/13 0544  WBC  --  11.1*  NEUTROABS  --  9.4*  HGB 8.5* 7.2*  HCT 25.0* 24.4*  MCV  --  77.0*  PLT  --  258   Cardiac Enzymes: No results found for this basename: CKTOTAL, CKMB, CKMBINDEX, TROPONINI,  in the last 168 hours  BNP (last 3 results)  Recent Labs  08/06/13 0321 09/10/13 2323  10/19/13 0643  PROBNP 222.8 1262.0* 1806.0*   CBG: No results found for this basename: GLUCAP,  in the last 168 hours  Radiological Exams on Admission: Dg Chest Port 1 View  10/19/2013   CLINICAL DATA:  Shortness of breath, altered mental status, weakness  EXAM: PORTABLE CHEST - 1 VIEW  COMPARISON:  Prior radiograph from 09/10/2013  FINDINGS: Mild cardiomegaly is stable as compared to the prior examination.  Lung volumes are within normal limits. There is diffuse pulmonary vascular congestion with interstitial thickening, compatible with pulmonary edema. Moderate bilateral pleural effusions are present. More confluent opacity at the left lung base likely represents edema and/ or atelectasis, although possible focal infiltrate is not excluded. No pneumothorax.  Osseous structures are within normal limits.  IMPRESSION: Diffuse pulmonary edema with moderate bilateral pleural effusions. More confluent opacity at the left lung base is favored to represent edema and/ or atelectasis, although a superimposed infiltrate could also have this appearance in the correct clinical setting.   Electronically Signed   By: Rise Mu M.D.   On: 10/19/2013 05:52    EKG: Independently reviewed. Diffuse ST depressions and T wave inversions in ant-lat leads  Assessment/Plan Active Problems:   MI, acute, non ST segment elevation   Aortic stenosis   CKD (chronic kidney disease) stage 3, GFR 30-59 ml/min   DM (diabetes mellitus) type II uncontrolled with renal manifestation   Acute on chronic diastolic CHF (congestive heart failure), NYHA class 4   Acute respiratory failure   Anemia of chronic disease   HCAP (healthcare-associated pneumonia)   1. Acute respiratory failure, likely due to congestive heart failure, possibly element of pneumonia although less likely. Patient will be admitted to the step down unit. Continue supportive oxygen. Currently he is noticing a recurrent worsening in his shortness of  breath. He may need to go back on BiPAP therapy. 2. Acute on chronic diastolic congestive heart failure. Patient recently had echo done in 07/2013 which showed preserved ejection fraction. He reports compliance with his vacations at home. His weight on his last discharge was noted to be 136 pounds. Currently his weight is 149 pounds. He'll be started on Lasix 60 mg IV 3 times a day. He's not a candidate for ACE inhibitors due to renal dysfunction. Continue to monitor strict intake and output. Patient will be started on a nitroglycerin infusion which hopefully help his shortness of breath. 3. Non-ST elevation MI. Currently, elevation in troponin could be related to demand ischemia from heart failure. We'll continue to try and troponins. I discussed the case with Dr. Antoine Poche at [facility]. He does not recommend starting anticoagulation at this time and I agree with this considering his recent history of GI bleeding. We will start the patient on a baby aspirin. Continue Protonix twice a day. He is already on a beta blocker and nitroglycerin infusion. We will consult cardiology in the morning. In the interim, if the patient continues to decompensate, consideration may be made for transfer to [facility]. With his current comorbidities, he does not appear to be a candidate for urgent cardiac catheterization. 4. Anemia, likely due to chronic disease from renal disease, also likely has a component of iron deficiency due to chronic blood loss from GI tract. Patient will be transfused 2 units of PRBCs. He does not have any signs of GI bleeding at this time. He's been started on aspirin for #3. Continue to monitor hemoglobin closely. Continue on Protonix. 5. History of GI bleeding from gastric AVMs. Currently, Hemoccult stools are negative. He does not have any signs of ongoing bleeding. We'll continue to monitor. 6. Diabetes. Start on sliding scale insulin. Restart basal insulin once his respiratory issues have improved  and he is able to eat and drink consistently. 7. Possible HCAP. Patient reports feeling feverish overnight, there is a mild leukocytosis and chest x-ray indicates a possible underlying infiltrate. He was recently in the hospital and therefore will be started on vancomycin and cefepime 8. Chronic kidney disease stage III. Continue to monitor renal function closely, especially in setting of aggressive diuresis.  Code Status: full code Family Communication: discussed with daughter [name] over the phone Disposition Plan: pending hospital course  Time spent: critical care:  [facility] Pager [phone]  If 7PM-7AM, please contact night-coverage [email or web address] Password [facility] 10/19/2013, 10:21 AM

## 2013-10-19 NOTE — ED Notes (Signed)
Pt to department via EMS.  Per report, call was for breathing difficulty.  EMS attempted to give albuterol treatment, but pt would not leave mask on.  Pt placed on C-pap.  Respiratory at bedside now.

## 2013-10-19 NOTE — ED Notes (Addendum)
Initially difficult to obtain much history due to patients extreme dyspnea.  Would respond yes or no to questions.  Contact made with emergency contact in chart - Leota Jacobsen and she states she will be here asap, coming from [place].    Neighbor/friend arrived  Louis Meckel  [phone] Patient called him and he called EMS to transport patient.

## 2013-10-19 NOTE — ED Notes (Signed)
Pt given urinal, assisted with removal of pants.

## 2013-10-19 NOTE — Progress Notes (Addendum)
ANTIBIOTIC CONSULT NOTE - INITIAL  Pharmacy Consult for Vancomycin and Cefepime Indication: pneumonia  No Known Allergies  Patient Measurements: Height: 5\' 5"  (165.1 cm) Weight: 149 lb (67.586 kg) IBW/kg (Calculated) : 61.5  Vital Signs: Temp: 97.9 F (36.6 C) (11/23 0745) Temp src: Oral (11/23 0745) BP: 141/50 mmHg (11/23 0945) Pulse Rate: 88 (11/23 0945) Intake/Output from previous day:   Intake/Output from this shift: Total I/O In: -  Out: 200 [Urine:200]  Labs:  Recent Labs  10/19/13 0539 10/19/13 0544  WBC  --  11.1*  HGB 8.5* 7.2*  PLT  --  258  CREATININE 2.10*  --    Estimated Creatinine Clearance: 19.9 ml/min (by C-G formula based on Cr of 2.1). No results found for this basename: VANCOTROUGH, VANCOPEAK, VANCORANDOM, GENTTROUGH, GENTPEAK, GENTRANDOM, TOBRATROUGH, TOBRAPEAK, TOBRARND, AMIKACINPEAK, AMIKACINTROU, AMIKACIN,  in the last 72 hours   Microbiology: No results found for this or any previous visit (from the past 720 hour(s)).  Medical History: Past Medical History  Diagnosis Date  . Diabetes mellitus   . Hypertension   . Arthritis   . Prostate cancer   . Gout   . Coronary artery disease     a. presumed, s/p NSTEMI 07/2013-->Med Rx  . GERD (gastroesophageal reflux disease)   . CKD (chronic kidney disease), stage III   . Iron deficiency anemia     a. 07/2013 s/p multiple PRBC's.  . Heme positive stool   . Moderate to severe aortic stenosis     a.  07/2013 Echo: EF50-55%, no rwma, mod-sev AS (0.78cm^2 VTI, 0.73cm^2 Vmax), mild AI, Mild MR, mod dil LA, PASP    Medications:  Prescriptions prior to admission  Medication Sig Dispense Refill  . allopurinol (ZYLOPRIM) 100 MG tablet Take 100 mg by mouth daily.        Marland Kitchen amLODipine (NORVASC) 10 MG tablet Take 1 tablet (10 mg total) by mouth daily.  30 tablet  11  . atorvastatin (LIPITOR) 40 MG tablet Take 1 tablet (40 mg total) by mouth daily at 6 PM.  30 tablet  1  . furosemide (LASIX) 40  MG tablet Take 1 tablet (40 mg total) by mouth 2 (two) times daily.  60 tablet  3  . hydrALAZINE (APRESOLINE) 50 MG tablet Take 1 tablet (50 mg total) by mouth 3 (three) times daily.  90 tablet  1  . insulin glargine (LANTUS) 100 UNIT/ML injection Inject 24-28 Units into the skin daily. 24 units if blood sugar is below 150 and 28 units if blood sugar is above 150.      . metoprolol (LOPRESSOR) 50 MG tablet Take 1 tablet (50 mg total) by mouth 2 (two) times daily.  60 tablet  1  . pantoprazole (PROTONIX) 40 MG tablet Take 40 mg by mouth 2 (two) times daily.      . potassium chloride SA (K-DUR,KLOR-CON) 20 MEQ tablet Take 1 tablet (20 mEq total) by mouth daily.  30 tablet  11   Assessment: Okay for Protocol, received Levaquin 750mg  IV x 1 in ED.  Possible HCAP. Patient reports feeling feverish overnight, there is a mild leukocytosis and chest x-ray indicates a possible underlying infiltrate. He was recently in the hospital.  Levaquin 750mg  X 1 11/23 Cefepime 11/23 >> Vancomycin 11/23 >>  Goal of Therapy:  Vancomycin trough level 15-20 mcg/ml  Plan:  Vancomycyin 1000mg  IV every 48 hours. Cefepime 1000mg  IV every 24 hours. Measure antibiotic drug levels at steady state Follow up culture results  Mady Gemma 10/19/2013,11:07 AM

## 2013-10-19 NOTE — Progress Notes (Signed)
Dr. Kerry Hough informed about patient having difficult time with breathing and having chest pain.  Sats staying around 90% and RR up to 28. New orders noted.

## 2013-10-19 NOTE — Progress Notes (Signed)
Dr. Adriana Simas up from ER to intubate patient. Amidate 10mg  IV given and succinylcholine 75mg  IV given. Patient intubate at bedside.  OG tube place and positive placement noted. Sats on vent at 95%. Chest xray ordered.

## 2013-10-19 NOTE — ED Provider Notes (Addendum)
CSN: [id number]     Arrival date & time 10/19/13  0450 History   First MD Initiated Contact with Patient 10/19/13 0500     Chief Complaint  Patient presents with  . Respiratory Distress   (Consider location/radiation/quality/duration/timing/severity/associated sxs/prior Treatment) HPI Comments: [name] is a 79 y.o. male who presents by EMS for evaluation of respiratory distress. During transport, he was treated with a nebulizer, and CPAP. The patient cannot give any history.  Level V caveat- severe illness   The history is provided by the patient and the EMS personnel.    Past Medical History  Diagnosis Date  . Diabetes mellitus   . Hypertension   . Arthritis   . Prostate cancer   . Gout   . Coronary artery disease     a. presumed, s/p NSTEMI 07/2013-->Med Rx  . GERD (gastroesophageal reflux disease)   . CKD (chronic kidney disease), stage III   . Iron deficiency anemia     a. 07/2013 s/p multiple PRBC's.  . Heme positive stool   . Moderate to severe aortic stenosis     a.  07/2013 Echo: EF50-55%, no rwma, mod-sev AS (0.78cm^2 VTI, 0.73cm^2 Vmax), mild AI, Mild MR, mod dil LA, PASP   Past Surgical History  Procedure Laterality Date  . Prostate surgery    . Umbilical hernia repair    . Carotid endarterectomy    . Esophagogastroduodenoscopy N/A 08/20/2013    [place]:Multiple nonbleeding gastric AVMs s/p APC.   Family History  Problem Relation Age of Onset  . Cancer Mother     Deceased  . Heart failure Sister     Deceased  . Heart failure Brother     Deceased  . Hypertension Mother     Deceased  . Hypertension Father    History  Substance Use Topics  . Smoking status: Former Games developer  . Smokeless tobacco: Not on file  . Alcohol Use: No    Review of Systems  Unable to perform ROS   Allergies  Review of patient's allergies indicates no known allergies.  Home Medications   Current Outpatient Rx  Name  Route  Sig  Dispense  Refill  . allopurinol  (ZYLOPRIM) 100 MG tablet   Oral   Take 100 mg by mouth daily.           Marland Kitchen amLODipine (NORVASC) 10 MG tablet   Oral   Take 1 tablet (10 mg total) by mouth daily.   30 tablet   11   . atorvastatin (LIPITOR) 40 MG tablet   Oral   Take 1 tablet (40 mg total) by mouth daily at 6 PM.   30 tablet   1   . furosemide (LASIX) 40 MG tablet   Oral   Take 1 tablet (40 mg total) by mouth 2 (two) times daily.   60 tablet   3   . hydrALAZINE (APRESOLINE) 50 MG tablet   Oral   Take 1 tablet (50 mg total) by mouth 3 (three) times daily.   90 tablet   1   . insulin glargine (LANTUS) 100 UNIT/ML injection   Subcutaneous   Inject 24-28 Units into the skin daily. 24 units if blood sugar is below 150 and 28 units if blood sugar is above 150.         . metoprolol (LOPRESSOR) 50 MG tablet   Oral   Take 1 tablet (50 mg total) by mouth 2 (two) times daily.   60 tablet  1   . pantoprazole (PROTONIX) 40 MG tablet   Oral   Take 40 mg by mouth 2 (two) times daily.         . potassium chloride SA (K-DUR,KLOR-CON) 20 MEQ tablet   Oral   Take 1 tablet (20 mEq total) by mouth daily.   30 tablet   11   . clopidogrel (PLAVIX) 75 MG tablet   Oral   Take 75 mg by mouth daily with breakfast.          BP 142/47  Pulse 89  Resp 24  SpO2 93% Physical Exam  Nursing note and vitals reviewed. Constitutional: He appears well-developed.  Frail, elderly  HENT:  Head: Normocephalic and atraumatic.  Right Ear: External ear normal.  Left Ear: External ear normal.  Eyes: Conjunctivae and EOM are normal. Pupils are equal, round, and reactive to light.  Neck: Phonation normal. Neck supple.  Cardiovascular: Normal rate.   3/6 systolic murmur at the base.  Pulmonary/Chest: He is in respiratory distress (tachypnea). He has wheezes. He has no rales. He exhibits no bony tenderness.  Decreased breath sounds bilaterally  Abdominal: Normal appearance. He exhibits no distension and no mass.   Musculoskeletal: He exhibits no edema.  Neurological: No sensory deficit.  He is obtunded  Skin: Skin is warm, dry and intact.  Psychiatric:  Unable to assess    ED Course  Procedures (including critical care time)  The patient was converted to CPAP in the emergency department, upon arrival. He was obtunded on initial evaluation.  0981- awaiting consultation with STEMI physician regarding the abnormal EKG that shows possible acute right heart STEMI. This patient's recent history has included both a full code and no code designations. On arrival there is no family history to cooperate and his end-of-life care plan.  05:30- I discussed the case with the on-call cardiologist, at [facility]. He were able to look at the EKGs together and discovered that the patient has had previous EKGs identical to that done today. The initial EKG that I saw from 09/11/2013, is different, but may be aberrant.   05:50- the patient has been removed from CPAP, and is now on nasal cannula oxygen with a normal oxygen saturation. Patient is now alert and oriented to place, and people. A friend of his is what the patient states that the patient has improved. The patient is not communicative. He is not in respiratory distress.  Lungs have somewhat diminished bilaterally with scattered wheezes.  07:54- Discussed with Cardiology, who accepts patient in transfer to [facility].  Medications  levofloxacin (LEVAQUIN) IVPB 750 mg (750 mg Intravenous New Bag/Given 10/19/13 0723)  albuterol (PROVENTIL,VENTOLIN) solution continuous neb ( Nebulization Given 10/19/13 0502)  ipratropium (ATROVENT) nebulizer solution 0.5 mg (0.5 mg Nebulization Given 10/19/13 0502)  furosemide (LASIX) injection 40 mg (40 mg Intravenous Given 10/19/13 0656)  0.9 %  sodium chloride infusion (10 mL/hr Intravenous New Bag/Given 10/19/13 0657)    Patient Vitals for the past 24 hrs:  BP Pulse Resp SpO2  10/19/13 0715 142/47 mmHg 89 24 93 %   10/19/13 0700 140/54 mmHg 92 25 93 %  10/19/13 0645 137/49 mmHg 95 28 93 %  10/19/13 0630 133/51 mmHg 95 28 93 %  10/19/13 0615 120/61 mmHg 96 30 89 %  10/19/13 0600 128/44 mmHg 90 23 94 %  10/19/13 0545 138/51 mmHg 90 26 94 %  10/19/13 0538 144/49 mmHg 86 24 97 %  10/19/13 0528 136/51 mmHg 87 18 98 %  10/19/13 0503 - - - 97 %    CRITICAL CARE Performed by: Mancel Bale L Total critical care time: 55 minutes Critical care time was exclusive of separately billable procedures and treating other patients. Critical care was necessary to treat or prevent imminent or life-threatening deterioration. Critical care was time spent personally by me on the following activities: development of treatment plan with patient and/or surrogate as well as nursing, discussions with consultants, evaluation of patient's response to treatment, examination of patient, obtaining history from patient or surrogate, ordering and performing treatments and interventions, ordering and review of laboratory studies, ordering and review of radiographic studies, pulse oximetry and re-evaluation of patient's condition.  Labs Review Labs Reviewed  CBC WITH DIFFERENTIAL - Abnormal; Notable for the following:    WBC 11.1 (*)    RBC 3.17 (*)    Hemoglobin 7.2 (*)    HCT 24.4 (*)    MCV 77.0 (*)    MCH 22.7 (*)    MCHC 29.5 (*)    RDW 18.1 (*)    Neutrophils Relative % 85 (*)    Neutro Abs 9.4 (*)    Lymphocytes Relative 7 (*)    All other components within normal limits  BLOOD GAS, ARTERIAL - Abnormal; Notable for the following:    pH, Arterial 7.346 (*)    pCO2 arterial 29.1 (*)    pO2, Arterial 63.6 (*)    Bicarbonate 15.5 (*)    Acid-base deficit 9.0 (*)    All other components within normal limits  PRO B NATRIURETIC PEPTIDE - Abnormal; Notable for the following:    Pro B Natriuretic peptide (BNP) 1806.0 (*)    All other components within normal limits  POCT I-STAT, CHEM 8 - Abnormal; Notable for the  following:    BUN 24 (*)    Creatinine, Ser 2.10 (*)    Glucose, Bld 263 (*)    Hemoglobin 8.5 (*)    HCT 25.0 (*)    All other components within normal limits  POCT I-STAT TROPONIN I - Abnormal; Notable for the following:    Troponin i, poc 0.16 (*)    All other components within normal limits  POCT I-STAT TROPONIN I   Imaging Review Dg Chest Port 1 View  10/19/2013   CLINICAL DATA:  Shortness of breath, altered mental status, weakness  EXAM: PORTABLE CHEST - 1 VIEW  COMPARISON:  Prior radiograph from 09/10/2013  FINDINGS: Mild cardiomegaly is stable as compared to the prior examination.  Lung volumes are within normal limits. There is diffuse pulmonary vascular congestion with interstitial thickening, compatible with pulmonary edema. Moderate bilateral pleural effusions are present. More confluent opacity at the left lung base likely represents edema and/ or atelectasis, although possible focal infiltrate is not excluded. No pneumothorax.  Osseous structures are within normal limits.  IMPRESSION: Diffuse pulmonary edema with moderate bilateral pleural effusions. More confluent opacity at the left lung base is favored to represent edema and/ or atelectasis, although a superimposed infiltrate could also have this appearance in the correct clinical setting.   Electronically Signed   By: Rise Mu M.D.   On: 10/19/2013 05:52      Date: 10/19/13- 0452  Rate: 98  Rhythm: normal sinus rhythm  QRS Axis: normal  PR and QT Intervals: normal  ST/T Wave abnormalities: ST elevation aVR, and diffuse ST depression  PR and QRS Conduction Disutrbances:none  Narrative Interpretation:   Old EKG Reviewed: changes noted since 09/11/13    EKG Interpretation  Date/Time:  Sunday October 19 2013 06:42:17 EST Ventricular Rate:  93 PR Interval:  188 QRS Duration: 88 QT Interval:  354 QTC Calculation: 440 R Axis:   33 Text Interpretation:  Normal sinus rhythm Left ventricular hypertrophy  with repolarization abnormality Abnormal ECG When compared with ECG of 19-Oct-2013 04:52, Premature atrial complexes are no longer Present no evolutionary changes Confirmed by Effie Shy  MD, [name] ([id number]) on 10/19/2013 6:55:38 AM            MDM   1. NSTEMI (non-ST elevated myocardial infarction)   2. Acute on chronic congestive heart failure   3. Anemia       Patient with recurrent, shortness of breath, worsening, now, for 2 weeks; since with signs and symptoms of congestive heart failure. His last admission on 09/15/2013 appears quite similar to the presentation today. The patient has improved with the treatment given. There is a possibility of pneumonia superimposed on pulmonary edema.  He'll need to be admitted for observation, and further management.  Nursing Notes Reviewed/ Care Coordinated, and agree without changes. Applicable Imaging Reviewed.  Interpretation of Laboratory Data incorporated into ED treatment  Plan: Admit    Flint Melter, MD 10/19/13 0755   08:20- I received word that there were no step down beds at [facility], at this time. There will be a delay to transfer the patient until a bed opens up. Since he will remain here, blood is ordered to transfuse here 2 units.  Flint Melter, MD 10/19/13 [phone]

## 2013-10-19 NOTE — Progress Notes (Signed)
Called by nurse with abnormal labs with elevated troponin of 7.14. Patient was only admitted with severe pulmonary edema requiring initially BiPAP and later intubated for worsening respiratory status. Patient had initial troponin of 2.13, and now has gone up to 7.14. Patient had non-STEMI in September when troponin went up to 20.0 and was managed medically by cardiology at that time. Patient has been started on Lasix drip and a VQ scan does not show pulmonary embolism. Called cardiology fellow Dr. Tresa Endo at [facility] and discussed the patient's labs. At this time Dr. Tresa Endo feels that patient has elevated troponin due to demand ischemia due to congestive heart failure exacerbation. We'll continue to diurese the patient with IV Lasix. Will not give heparin at this time as patient is a high bleeding risk has a history of AVM. Also low likelihood of non-STEMI. I will repeat EKG.

## 2013-10-19 NOTE — Progress Notes (Signed)
Patient has been coming more and more anxious and noted wheezing. Dr. Kerry Hough into see patient. Respiratory informed about placing patient on bipap.

## 2013-10-19 NOTE — Progress Notes (Signed)
ANTIBIOTIC CONSULT NOTE-Preliminary  Pharmacy Consult for Levofloxacin Indication:Pneumonia   No Known Allergies  Patient Measurements:    Vital Signs: BP: 133/51 mmHg (11/23 0630) Pulse Rate: 95 (11/23 0630)  Labs:  Recent Labs  10/19/13 0539 10/19/13 0544  WBC  --  11.1*  HGB 8.5* 7.2*  PLT  --  258  CREATININE 2.10*  --     The CrCl is unknown because both a height and weight (above a minimum accepted value) are required for this calculation.  No results found for this basename: VANCOTROUGH, VANCOPEAK, VANCORANDOM, GENTTROUGH, GENTPEAK, GENTRANDOM, TOBRATROUGH, TOBRAPEAK, TOBRARND, AMIKACINPEAK, AMIKACINTROU, AMIKACIN,  in the last 72 hours   Microbiology: No results found for this or any previous visit (from the past 720 hour(s)).  Medical History: Past Medical History  Diagnosis Date  . Diabetes mellitus   . Hypertension   . Arthritis   . Prostate cancer   . Gout   . Coronary artery disease     a. presumed, s/p NSTEMI 07/2013-->Med Rx  . GERD (gastroesophageal reflux disease)   . CKD (chronic kidney disease), stage III   . Iron deficiency anemia     a. 07/2013 s/p multiple PRBC's.  . Heme positive stool   . Moderate to severe aortic stenosis     a.  07/2013 Echo: EF50-55%, no rwma, mod-sev AS (0.78cm^2 VTI, 0.73cm^2 Vmax), mild AI, Mild MR, mod dil LA, PASP    Medications:   Assessment: 79 yo male brought to the ED in respiratory distress. Further evaluation pending.  Goal of Therapy:  Eradication of infection  Plan:  Preliminary review of pertinent patient information completed.  Protocol will be initiated with a one-time dose of Levofloxacin 750 mg IV.  Jeani Hawking clinical pharmacist will complete review during morning rounds to assess patient and finalize treatment regimen.  Arelia Sneddon, [facility] 10/19/2013,7:01 AM

## 2013-10-19 NOTE — ED Provider Notes (Addendum)
Discussed with Dr. Kerry Hough.   He'll accept the patient at [facility].  Donnetta Hutching, MD 10/19/13 [phone]    11:00   called to intensive care you to by Dr. Kerry Hough for intubation.   Patient admitted for pulmonary edema and has had worsening labored respirations  Procedure Note:   CPAP in place initially. Rapid sequence induction using etomidate 10 mg and succinylcholine 75 mg.  Patient intubated with #3 McIntosh blade and 7.5 endotracheal tube.  Bilateral breath sounds appreciated.   Carbon dioxide monitor changed appropriate color. Pulse ox stayed above 90%.   Followup chest x-ray reviewed. Good endotracheal tube placement.  Donnetta Hutching, MD 10/19/13 1345

## 2013-10-19 NOTE — ED Notes (Signed)
Pt states he feels like he is having difficulty breathing again, RT paged per Hospitalist, pt may need Bipap again.

## 2013-10-19 NOTE — Progress Notes (Signed)
CRITICAL VALUE ALERT  Critical value received:  Troponin 2.13  Date of notification:  10/19/2013  Time of notification:  1215  Critical value read back:yes  Nurse who received alert:  Idelle Leech   MD notified (1st page):  J Memon  Time of first page: 1218  MD notified (2nd page):  Time of second page:  Responding MD:  Veneda Melter Time MD responded:  1218 Provider to bedside

## 2013-10-20 ENCOUNTER — Encounter (HOSPITAL_COMMUNITY): Payer: Self-pay | Admitting: Adult Health

## 2013-10-20 ENCOUNTER — Inpatient Hospital Stay (HOSPITAL_COMMUNITY): Payer: Medicare Other

## 2013-10-20 DIAGNOSIS — K922 Gastrointestinal hemorrhage, unspecified: Secondary | ICD-10-CM

## 2013-10-20 DIAGNOSIS — J189 Pneumonia, unspecified organism: Secondary | ICD-10-CM

## 2013-10-20 DIAGNOSIS — D638 Anemia in other chronic diseases classified elsewhere: Secondary | ICD-10-CM

## 2013-10-20 DIAGNOSIS — I10 Essential (primary) hypertension: Secondary | ICD-10-CM

## 2013-10-20 DIAGNOSIS — D62 Acute posthemorrhagic anemia: Secondary | ICD-10-CM

## 2013-10-20 DIAGNOSIS — J9620 Acute and chronic respiratory failure, unspecified whether with hypoxia or hypercapnia: Secondary | ICD-10-CM

## 2013-10-20 DIAGNOSIS — N179 Acute kidney failure, unspecified: Secondary | ICD-10-CM

## 2013-10-20 LAB — TYPE AND SCREEN: ABO/RH(D): O POS

## 2013-10-20 LAB — COMPREHENSIVE METABOLIC PANEL
ALT: 21 U/L (ref 0–53)
AST: 45 U/L — ABNORMAL HIGH (ref 0–37)
Albumin: 3 g/dL — ABNORMAL LOW (ref 3.5–5.2)
Alkaline Phosphatase: 65 U/L (ref 39–117)
BUN: 34 mg/dL — ABNORMAL HIGH (ref 6–23)
CO2: 23 mEq/L (ref 19–32)
Chloride: 103 mEq/L (ref 96–112)
GFR calc non Af Amer: 18 mL/min — ABNORMAL LOW (ref >90)
Potassium: 4.3 mEq/L (ref 3.5–5.1)
Sodium: 138 mEq/L (ref 135–145)
Total Bilirubin: 0.6 mg/dL (ref 0.3–1.2)
Total Protein: 6.5 g/dL (ref 6.0–8.3)

## 2013-10-20 LAB — BLOOD GAS, ARTERIAL
Acid-base deficit: 1.7 mmol/L (ref 0.0–2.0)
FIO2: 0.4 %
MECHVT: 500 mL
PEEP: 5 cmH2O
RATE: 14 resp/min
pCO2 arterial: 32.7 mmHg — ABNORMAL LOW (ref 35.0–45.0)
pH, Arterial: 7.441 (ref 7.350–7.450)
pO2, Arterial: 82.3 mmHg (ref 80.0–100.0)

## 2013-10-20 LAB — CBC
MCH: 25.1 pg — ABNORMAL LOW (ref 26.0–34.0)
MCV: 78.2 fL (ref 78.0–100.0)
Platelets: 207 10*3/uL (ref 150–400)
RDW: 17.9 % — ABNORMAL HIGH (ref 11.5–15.5)

## 2013-10-20 LAB — GLUCOSE, CAPILLARY: Glucose-Capillary: 119 mg/dL — ABNORMAL HIGH (ref 70–99)

## 2013-10-20 MED ORDER — SODIUM BICARBONATE 8.4 % IV SOLN
INTRAVENOUS | Status: AC
  Filled 2013-10-20: qty 150

## 2013-10-20 MED ORDER — ASPIRIN 325 MG PO TABS
325.0000 mg | ORAL_TABLET | Freq: Every day | ORAL | Status: DC
  Administered 2013-10-20: 325 mg via ORAL
  Filled 2013-10-20: qty 1

## 2013-10-20 MED ORDER — ISOSORBIDE DINITRATE 20 MG PO TABS
10.0000 mg | ORAL_TABLET | Freq: Three times a day (TID) | ORAL | Status: DC
  Administered 2013-10-20 – 2013-10-28 (×10): 10 mg via ORAL
  Filled 2013-10-20 (×11): qty 1

## 2013-10-20 NOTE — Consult Note (Signed)
CARDIOLOGY CONSULT NOTE   Patient ID: [name] MRN: [id number] DOB/AGE: [birth year] 79 y.o.  Admit Date: 10/19/2013 Referring Physician: PTH Primary Physician: Duane Courier, MD Consulting Cardiologist: [name] Primary Cardiologist: [name] Reason for Consultation: NSTEMI, Pulmonary Edema  Clinical Summary [name] is a 79 y.o.male admitted with non-ST elevation MI, and pulmonary edema, VDRF over the weekend. Patient has known history of coronary artery disease, with most recent non-ST elevated MI in September 2014 with medical management only. He also has a history of moderate to severe aortic valve stenosis with an echocardiogram completed in September 2014 revealing moderate to severe AF (0.78 cm square by VTI, and 0.73 cm square by Vmax). He also has a history of chronic kidney disease stage III, and GI bleeding from gastric AVMs. He had recent hospitalization in October of 2014 for chronic CHF. He was admitted on 10/19/2013, for acute respiratory distress requiring BiPAP therapy. He is found to be in pulmonary edema. Was found to have elevated troponins over the next 24 hours after admission rising from 2.13-10.54. Pro BNP was elevated at 1806. He was found to be anemic with a hemoglobin of 7.2. He received 2 units of packed red blood cells. With hemoglobin of 8.5 this a.m.    Initially the patient is planned to be transferred to [place] in the setting of recurrent CHF to be assisted by the CHF team, however there were no step down beds available over the weekend. The patient was therefore kept here with deterioration of respiratory status requiring intubation. The patient has been placed on IV Lasix with very little urine output of approximately 500 cc. He has subsequently been placed on a Lasix drip at 4 mg/hr, with increase in fluid balance of 1000 cc. Creatinine on admission 2.10 increased to 2.83.He is also being treated for HCAP with antibiotics. We  are asked for further evaluation and recommendations. A limited echocardiogram has been ordered.   History is obtained from current and prior records as he is intubated. He denies chest pain.    No Known Allergies  Medications Scheduled Medications: . albuterol  2.5 mg Nebulization Q6H  . allopurinol  100 mg Oral Daily  . antiseptic oral rinse  15 mL Mouth Rinse QID  . aspirin EC  81 mg Oral Daily  . atorvastatin  40 mg Oral q1800  . ceFEPime (MAXIPIME) IV  1 g Intravenous Q24H  . chlorhexidine  15 mL Mouth Rinse BID  . fentaNYL  50 mcg Intravenous Once  . heparin  5,000 Units Subcutaneous Q8H  . hydrALAZINE  50 mg Oral TID  . insulin aspart  0-15 Units Subcutaneous Q4H  . ipratropium  0.5 mg Nebulization Q6H  . metoprolol  50 mg Oral BID  . pantoprazole (PROTONIX) IV  40 mg Intravenous Q12H  . sodium chloride  3 mL Intravenous Q12H  . vancomycin  1,000 mg Intravenous Q48H    Infusions: . fentaNYL infusion INTRAVENOUS 5 mcg/hr (10/19/13 1346)  . furosemide (LASIX) infusion 4 mg/hr (10/19/13 2200)  .  sodium bicarbonate  infusion 1000 mL 75 mL/hr at 10/20/13 0800    PRN Medications: sodium chloride, acetaminophen, acetaminophen, albuterol, fentaNYL, metoprolol, morphine injection, ondansetron (ZOFRAN) IV, ondansetron, sodium chloride   Past Medical History  Diagnosis Date  . Diabetes mellitus   . Hypertension   . Arthritis   . Prostate cancer   . Gout   . Coronary artery disease     a. presumed, s/p NSTEMI 07/2013-->Med Rx  .  GERD (gastroesophageal reflux disease)   . CKD (chronic kidney disease), stage III   . Iron deficiency anemia     a. 07/2013 s/p multiple PRBC's.  . Heme positive stool   . Moderate to severe aortic stenosis     a.  07/2013 Echo: EF50-55%, no rwma, mod-sev AS (0.78cm^2 VTI, 0.73cm^2 Vmax), mild AI, Mild MR, mod dil LA, PASP  . Rheumatic fever     Past Surgical History  Procedure Laterality Date  . Prostate surgery    . Umbilical  hernia repair    . Carotid endarterectomy    . Esophagogastroduodenoscopy N/A 08/20/2013    [name]:Multiple nonbleeding gastric AVMs s/p APC.    Family History  Problem Relation Age of Onset  . Cancer Mother     Deceased  . Heart failure Sister     Deceased  . Heart failure Brother     Deceased  . Hypertension Mother     Deceased  . Hypertension Father     Social History [name] reports that he has quit smoking. He does not have any smokeless tobacco history on file. [name] reports that he does not drink alcohol.  Review of Systems Otherwise reviewed and negative except as outlined.  Physical Examination Blood pressure 129/49, pulse 82, temperature 99.4 F (37.4 C), temperature source Axillary, resp. rate 13, height 5\' 5"  (1.651 m), weight 147 lb 4.3 oz (66.8 kg), SpO2 94.00%.  Intake/Output Summary (Last 24 hours) at 10/20/13 0900 Last data filed at 10/20/13 0848  Gross per 24 hour  Intake 2005.17 ml  Output    600 ml  Net 1405.17 ml    Telemetry: NSR  HEENT: Conjunctiva and lids normal, oropharynx clear with moist mucosa.Intubated on sedation. Neck: Supple, postive elevated JVP or carotid bruits, no thyromegaly. Lungs: Diminished breath sounds in the bases. Intubated, no over wheezing. Cardiac: Regular rate and rhythm,with high pitched systolic murmur noted at the LSB and RSB into the abdomen, no pericardial rub. Abdomen: Soft, nontender, no hepatomegaly, bowel sounds present, no guarding or rebound. Extremities: Ankle pitting edema, distal pulses 2+. Skin: Warm and dry. Musculoskeletal: No kyphosis. Neuropsychiatric: Unable to assess due to mild sedation and intubation. Responds to questions with head nodding.  Prior Cardiac Testing/Procedures 1. Echocardiogram: 08/06/2013 Left ventricle: The cavity size was at the lower limits of normal. Wall thickness was increased increased in a pattern of mild to moderate LVH. Systolic function was normal. The  estimated ejection fraction was in the range of 50% to 55%. Wall motion was normal; there were no regional wall motion abnormalities. Diastolic function is indeterminate, there is evidence of elevated left atrial pressure. - Aortic valve: Mildly calcified annulus. Trileaflet; severely thickened leaflets. The left coronary cusps is heavily calcified with severely restricted movement. There was moderate to severe stenosis. Majority of parameters meet criteria for severe stenosis, gradients likely moderate due to Doppler alignment. Morphologically and by continuity equation the stenosis appears severe. Mild regurgitation. Valve area: 0.78cm^2(VTI). Valve area: 0.73cm^2 (Vmax). - Mitral valve: Mildly calcified annulus. Mildly thickened leaflets . Mild regurgitation. - Left atrium: The atrium was moderately dilated. - Pulmonary arteries: Systolic pressure was moderately increased, estimated to be 49mm Hg.  2. Nuclear Medicine Stress Test: 08/13/2013 Fixed thinning of the inferior wall vs diaphragmatic attenuation artifact.  No stress induced ischemia. Ejection fraction 53%.    Lab Results  Basic Metabolic Panel:  Recent Labs Lab 10/19/13 0539 10/19/13 1525 10/20/13 0408  NA 141 134* 138  K 3.7  4.9 4.3  CL 110 101 103  CO2  --  10* 23  GLUCOSE 263* 260* 168*  BUN 24* 29* 34*  CREATININE 2.10* 2.28* 2.83*  CALCIUM  --  9.3 9.5    Liver Function Tests:  Recent Labs Lab 10/20/13 0408  AST 45*  ALT 21  ALKPHOS 65  BILITOT 0.6  PROT 6.5  ALBUMIN 3.0*    CBC:  Recent Labs Lab 10/19/13 0539 10/19/13 0544 10/20/13 0408  WBC  --  11.1* 8.9  NEUTROABS  --  9.4*  --   HGB 8.5* 7.2* 8.5*  HCT 25.0* 24.4* 26.5*  MCV  --  77.0* 78.2  PLT  --  258 207    Cardiac Enzymes:  Recent Labs Lab 10/19/13 1127 10/19/13 2131 10/20/13 0408  TROPONINI 2.13* 7.14* 10.54*    BNP: 1,806   Radiology: Nm Pulmonary Perfusion  10/19/2013   CLINICAL DATA:  Shortness of  breath. Respiratory failure and renal failure.  EXAM: NUCLEAR MEDICINE PULMONARY PERFUSION SCAN  TECHNIQUE: Perfusion images were obtained in multiple projections after intravenous injection of radiopharmaceutical.  COMPARISON:  CHEST RADIOGRAPH ON 10/19/2013  RADIOPHARMACEUTICALS:  6 mCi Tc35m MAA  FINDINGS: A nonsegmental perfusion defect is seen in the left lung corresponding to pleural fluid within the major fissure seen radiographically. No segmental pulmonary perfusion defects identified in either lung.  IMPRESSION: No segmental pulmonary perfusion defects identified.   Electronically Signed   By: Myles Rosenthal M.D.   On: 10/19/2013 21:44   Portable Chest Xray In Am  10/20/2013   CLINICAL DATA:  History shortness of breath.  EXAM: PORTABLE CHEST - 1 VIEW  COMPARISON:  10/19/2013  FINDINGS: Endotracheal tube is 3.8 cm above the carina. There are increased basilar densities suggestive for pleural fluid and probable pulmonary edema. The heart is obscured by the basilar chest densities. Nasogastric tube extends into the abdomen.  IMPRESSION: Increased pleural and parenchymal densities are suggestive for pulmonary edema and pleural effusions.  Support apparatuses as described.   Electronically Signed   By: Richarda Overlie M.D.   On: 10/20/2013 07:52    ECG: NSR with inferior/lateral T-wave inversion, LVH. Unchanged from EKG in October of 2014.   Impression and Recommendations  1. NSTEMI: Cardiac markers are elevated since admission with rise from 2.13 to 10.54.  When admitted last month, discussion for cardiac cath by Dr.Koneswaran on 08/06/2013, with decision not to proceed due concerns for contrast induced nephropathy. He was to be continued on medical therapy. A limited echo is being completed for LV fx today. Plavix has been discontinued in the setting of anemia, and ASA only is provided. EKG does not show evidence of worsening ischemia at this time, with continued inferior/lateral T-wave  inversion.  2.Acute on Chronic CHF: Most recent echo in Sept demonstrated normal EF, but significant AV stenosis. He is not diuresing well currently, despite IV lasix, and has been transitioned to IV gtt. Consider one dose of metolazone. May need inotropic support, but BP is currently stable. Awaiting echo results.   3.Severe AoV stenosis: Per echocardiogram completed in Sept 2014, he had moderate stenosis, with Valve area: 0.78cm^2(VTI). Valve area: 0.73cm^2 (Vmax). He is not a candidate for surgery at this time due to age and co-morbidites. Significantly contributing to CHF. Heart rate is well controlled on metoprolol.  Consider low dose nitrate for afterload reduction.  4. Anemia: Hx of AVM's. He has received 2 untis of PRBC's with only mild improvement in Hgb.  GI consultation may  need necessary.   5. VDRF: Followed now by Dr. Juanetta Gosling, with HCAP treatment with abx.  6. Hypertension: Normal at present. Was on amlodipine, hydralazine at home.Hydralazine now at 50 mg BID.  Signed: Bettey Mare. Lyman Bishop NP [facility] 10/20/2013, 9:00 AM Co-Sign MD

## 2013-10-20 NOTE — Progress Notes (Signed)
INITIAL NUTRITION ASSESSMENT  DOCUMENTATION CODES Per approved criteria  -Severe Malnutrition in the context of chronic illness   INTERVENTION:   If unable to wean >24-48 hrs recommend: Initiate Vital AF @  40 ml/hr via OGT and add ml Prostat 30 ml daily.  At goal rate, tube feeding regimen will provide 1252 kcal, 90 grams of protein, and 811 ml of H2O. Nutrition support will provide: 89% est energy, 100% protein q 24 hr.  Implement Adult Enteral protocol  NUTRITION DIAGNOSIS: 1.Malnutrition; ongoing 2. Inadequate oral intake related to inability to eat due to respiratory failure as evidenced by NPO status, intubated.  Goal: Pt to meet >/= 90% of their estimated nutrition needs   Monitor:  Respiratory status, labs, weight changes, I/O's  Reason for Assessment: Mechanical Ventilation  79 y.o. male  ASSESSMENT: Patient is currently intubated on ventilator support due to acute respiratory failure, acute on chronic CHF and possible PNA. Furosemide drip. Malnutrition dx 09/10/13 [facility] wt 141.8 during October hospitalization. Current wt gain likely related to fluid status changes. MV: 8.1 L/min Temp:Temp (24hrs), Avg:98.5 F (36.9 C), Min:96.7 F (35.9 C), Max:99.4 F (37.4 C)  Patient Active Problem List   Diagnosis Date Noted  . Acute respiratory failure 10/19/2013  . Anemia of chronic disease 10/19/2013  . HCAP (healthcare-associated pneumonia) 10/19/2013  . Protein-calorie malnutrition, severe 09/13/2013  . Acute on chronic diastolic CHF (congestive heart failure), NYHA class 4 09/11/2013  . Elevated troponin 08/18/2013  . Acute blood loss anemia 08/16/2013  . Chronic diastolic CHF (congestive heart failure) 08/16/2013  . GI bleed 08/16/2013  . Iron deficiency anemia 08/16/2013  . History of non-ST elevation myocardial infarction (NSTEMI) 08/16/2013  . Rotavirus enteritis 08/07/2013  . NSTEMI (non-ST elevated myocardial infarction) 08/07/2013  . Acute and chronic  respiratory failure w/ pulmonary edema 08/06/2013  . Acute diastolic CHF (congestive heart failure) 08/06/2013  . Metabolic acidosis 08/06/2013  . CKD (chronic kidney disease) stage 3, GFR 30-59 ml/min 09/06/2012  . GERD (gastroesophageal reflux disease) 09/06/2012  . Iron deficiency 09/06/2012  . DM (diabetes mellitus) type II uncontrolled with renal manifestation 09/06/2012  . Aortic stenosis 08/07/2012  . UTI (urinary tract infection) 08/04/2012  . Dehydration 08/04/2012  . Sepsis 08/04/2012  . MI, acute, non ST segment elevation 08/04/2012  . Acute renal failure 08/04/2012  . Hyponatremia 08/04/2012  . Hypokalemia 08/04/2012  . Anemia 08/04/2012  . HTN (hypertension) 08/04/2012  . Gout 08/04/2012    Height: Ht Readings from Last 1 Encounters:  10/20/13 5\' 5"  (1.651 m)    Weight: Wt Readings from Last 1 Encounters:  10/20/13 147 lb 4.3 oz (66.8 kg)    Ideal Body Weight: 136# (61.8 kg)  % Ideal Body Weight: 108%  Wt Readings from Last 10 Encounters:  10/20/13 147 lb 4.3 oz (66.8 kg)  10/08/13 147 lb (66.679 kg)  09/19/13 143 lb (64.864 kg)  09/15/13 136 lb 14.5 oz (62.1 kg)  08/19/13 146 lb 2.6 oz (66.3 kg)  08/19/13 146 lb 2.6 oz (66.3 kg)  08/19/13 146 lb 2.6 oz (66.3 kg)  08/11/13 149 lb 0.5 oz (67.6 kg)  09/06/12 148 lb 5.9 oz (67.3 kg)  08/05/12 150 lb 5.7 oz (68.2 kg)    Usual Body Weight: 145-150#  % Usual Body Weight: 100%  BMI:  Body mass index is 24.51 kg/(m^2).normal range  Estimated Nutritional Needs: Kcal: 1408 ([employer]) Protein: 80-90 gr Fluid: per team goals  Skin: intact  Diet Order: NPO  EDUCATION NEEDS: -No  education needs identified at this time   Intake/Output Summary (Last 24 hours) at 10/20/13 1121 Last data filed at 10/20/13 1012  Gross per 24 hour  Intake 2008.17 ml  Output    600 ml  Net 1408.17 ml    Last BM: PTA  Labs:   Recent Labs Lab 10/19/13 0539 10/19/13 1525 10/20/13 0408  NA 141 134* 138  K 3.7  4.9 4.3  CL 110 101 103  CO2  --  10* 23  BUN 24* 29* 34*  CREATININE 2.10* 2.28* 2.83*  CALCIUM  --  9.3 9.5  GLUCOSE 263* 260* 168*    CBG (last 3)   Recent Labs  10/20/13 0014 10/20/13 0413 10/20/13 0715  GLUCAP 174* 164* 119*    Scheduled Meds: . albuterol  2.5 mg Nebulization Q6H  . allopurinol  100 mg Oral Daily  . antiseptic oral rinse  15 mL Mouth Rinse QID  . aspirin  325 mg Oral Daily  . atorvastatin  40 mg Oral q1800  . ceFEPime (MAXIPIME) IV  1 g Intravenous Q24H  . chlorhexidine  15 mL Mouth Rinse BID  . fentaNYL  50 mcg Intravenous Once  . heparin  5,000 Units Subcutaneous Q8H  . hydrALAZINE  50 mg Oral TID  . insulin aspart  0-15 Units Subcutaneous Q4H  . ipratropium  0.5 mg Nebulization Q6H  . metoprolol  50 mg Oral BID  . pantoprazole (PROTONIX) IV  40 mg Intravenous Q12H  . sodium chloride  3 mL Intravenous Q12H  . vancomycin  1,000 mg Intravenous Q48H    Continuous Infusions: . fentaNYL infusion INTRAVENOUS 5 mcg/hr (10/19/13 1346)  . furosemide (LASIX) infusion 4 mg/hr (10/19/13 2200)    Past Medical History  Diagnosis Date  . Diabetes mellitus   . Hypertension   . Arthritis   . Prostate cancer   . Gout   . Coronary artery disease     a. presumed, s/p NSTEMI 07/2013-->Med Rx  . GERD (gastroesophageal reflux disease)   . CKD (chronic kidney disease), stage III   . Iron deficiency anemia     a. 07/2013 s/p multiple PRBC's.  . Heme positive stool   . Moderate to severe aortic stenosis     a.  07/2013 Echo: EF50-55%, no rwma, mod-sev AS (0.78cm^2 VTI, 0.73cm^2 Vmax), mild AI, Mild MR, mod dil LA, PASP  . Rheumatic fever     Past Surgical History  Procedure Laterality Date  . Prostate surgery    . Umbilical hernia repair    . Carotid endarterectomy    . Esophagogastroduodenoscopy N/A 08/20/2013    [place]:Multiple nonbleeding gastric AVMs s/p APC.    Royann Shivers MS,RD,CSG,LDN Office: [phone] Pager: [phone]

## 2013-10-20 NOTE — Progress Notes (Signed)
[facility] PROGRESS NOTE  [name] UJW:[id number] DOB: 02/12/1920 DOA: 10/19/2013 PCP: Evlyn Courier, MD  Assessment/Plan: 1. Acute respiratory failure. Felt to be secondary to congestive heart failure, possibly element of associated pneumonia. Patient required intubation with mechanical ventilation on 11/23. He remains on the ventilator. Appreciate pulmonology assistance with management. We'll try to wean as tolerated. 2. Acute on chronic diastolic congestive heart failure. Most recent ejection fraction was found in normal range in 07/2013. Patient presents shortness of breath, pulmonary edema has developed pleural effusions. His dry weight on his last distress was noted to be 136 pounds. Easily from this morning is noted to be 147 pounds. The patient was initially started on Lasix 60 mg IV every 8 hours, but did not have any significant. He was transitioned to a Lasix infusion and urine output still appears to be poor. Cardiology has been consulted. Repeat echocardiogram has been ordered. 3. Non-ST elevation MI. Most recent troponin was noted to be 10.5. He was also on any anticoagulation or Plavix with his history of recent GI bleeding from gastric AVMs. The patient was started on aspirin since his rectal exam did not show any Hemoccult positive stools and has not had any gross evidence of GI bleeding. He is continued on beta blocker, statin and aspirin. He is not a candidate for ACE inhibitors due to chronic kidney disease. 4. Possible healthcare associated pneumonia. There was question of underlying infiltrate on chest x-ray, patient did report feeling feverish admission. He is to vancomycin and cefepime for now. If he does not have any further fever spikes in the next 24 hours, can consider discontinuing antibiotics. Blood cultures are pending. 5. Anemia, likely due to chronic disease as well as iron deficiency from chronic GI blood loss. The patient was transfused 2 units PRBCs on  admission. Continue to trend hemoglobin and watch for signs of bleeding. 6. Chronic kidney disease stage III. Creatinine has been trending up with diuresis. He has been started on a Lasix drip. We'll need to follow renal function closely 7. Metabolic acidosis. Patient elevated lactate as well as a serum bicarbonate of 10. He was briefly placed on a bicarbonate infusion which has since been discontinued. May be possibly due to hypoperfusion due to low cardiac output. Continue to follow.  Code Status: full code Family Communication: updated daughter Claris Gower over the phone Disposition Plan: pending hospital course   Consultants:  Cardiology  Pulmonology  Procedures:  Intubation 11/23  Antibiotics:  Vancomycin 11/24  Cefepime 11/24  HPI/Subjective: Intubated and sedated  Objective: Filed Vitals:   10/20/13 1012  BP: 138/54  Pulse:   Temp:   Resp:     Intake/Output Summary (Last 24 hours) at 10/20/13 1039 Last data filed at 10/20/13 1012  Gross per 24 hour  Intake 2008.17 ml  Output    600 ml  Net 1408.17 ml   Filed Weights   10/19/13 0755 10/19/13 1104 10/20/13 0500  Weight: 67.586 kg (149 lb) 67 kg (147 lb 11.3 oz) 66.8 kg (147 lb 4.3 oz)    Exam:   General:  Intubated on ventilator  Cardiovascular: S1, S2 RRR  Respiratory: crackles at bases  Abdomen: soft, nt, nd, bs+  Musculoskeletal: no significant pedal edema   Data Reviewed: Basic Metabolic Panel:  Recent Labs Lab 10/19/13 0539 10/19/13 1525 10/20/13 0408  NA 141 134* 138  K 3.7 4.9 4.3  CL 110 101 103  CO2  --  10* 23  GLUCOSE 263* 260* 168*  BUN 24*  29* 34*  CREATININE 2.10* 2.28* 2.83*  CALCIUM  --  9.3 9.5   Liver Function Tests:  Recent Labs Lab 10/20/13 0408  AST 45*  ALT 21  ALKPHOS 65  BILITOT 0.6  PROT 6.5  ALBUMIN 3.0*   No results found for this basename: LIPASE, AMYLASE,  in the last 168 hours No results found for this basename: AMMONIA,  in the last 168  hours CBC:  Recent Labs Lab 11/12/2013 0539 2013/11/12 0544 10/20/13 0408  WBC  --  11.1* 8.9  NEUTROABS  --  9.4*  --   HGB 8.5* 7.2* 8.5*  HCT 25.0* 24.4* 26.5*  MCV  --  77.0* 78.2  PLT  --  258 207   Cardiac Enzymes:  Recent Labs Lab Nov 12, 2013 1127 11/12/13 2131 10/20/13 0408  TROPONINI 2.13* 7.14* 10.54*   BNP (last 3 results)  Recent Labs  08/06/13 0321 09/10/13 2323 11/12/13 0643  PROBNP 222.8 1262.0* 1806.0*   CBG:  Recent Labs Lab 11-12-2013 1618 November 12, 2013 1941 10/20/13 0014 10/20/13 0413 10/20/13 0715  GLUCAP 237* 174* 174* 164* 119*    Recent Results (from the past 240 hour(s))  MRSA PCR SCREENING     Status: None   Collection Time    November 12, 2013 11:25 AM      Result Value Range Status   MRSA by PCR NEGATIVE  NEGATIVE Final   Comment:            The GeneXpert MRSA Assay (FDA     approved for NASAL specimens     only), is one component of a     comprehensive MRSA colonization     surveillance program. It is not     intended to diagnose MRSA     infection nor to guide or     monitor treatment for     MRSA infections.  CULTURE, BLOOD (ROUTINE X 2)     Status: None   Collection Time    2013-11-12  9:31 PM      Result Value Range Status   Specimen Description Blood BLOOD RIGHT WRIST   Final   Special Requests BOTTLES DRAWN AEROBIC AND ANAEROBIC 8CC   Final   Culture PENDING   Incomplete   Report Status PENDING   Incomplete  CULTURE, BLOOD (ROUTINE X 2)     Status: None   Collection Time    11/12/2013  9:45 PM      Result Value Range Status   Specimen Description Blood BLOOD RIGHT HAND   Final   Special Requests BOTTLES DRAWN AEROBIC AND ANAEROBIC [facility]   Final   Culture PENDING   Incomplete   Report Status PENDING   Incomplete     Studies: Nm Pulmonary Perfusion  Nov 12, 2013   CLINICAL DATA:  Shortness of breath. Respiratory failure and renal failure.  EXAM: NUCLEAR MEDICINE PULMONARY PERFUSION SCAN  TECHNIQUE: Perfusion images were obtained in  multiple projections after intravenous injection of radiopharmaceutical.  COMPARISON:  CHEST RADIOGRAPH ON 11/12/13  RADIOPHARMACEUTICALS:  6 mCi Tc38m MAA  FINDINGS: A nonsegmental perfusion defect is seen in the left lung corresponding to pleural fluid within the major fissure seen radiographically. No segmental pulmonary perfusion defects identified in either lung.  IMPRESSION: No segmental pulmonary perfusion defects identified.   Electronically Signed   By: Myles Rosenthal M.D.   On: 2013-11-12 21:44   Portable Chest Xray In Am  10/20/2013   CLINICAL DATA:  History shortness of breath.  EXAM: PORTABLE CHEST - 1 VIEW  COMPARISON:  10/19/2013  FINDINGS: Endotracheal tube is 3.8 cm above the carina. There are increased basilar densities suggestive for pleural fluid and probable pulmonary edema. The heart is obscured by the basilar chest densities. Nasogastric tube extends into the abdomen.  IMPRESSION: Increased pleural and parenchymal densities are suggestive for pulmonary edema and pleural effusions.  Support apparatuses as described.   Electronically Signed   By: Richarda Overlie M.D.   On: 10/20/2013 07:52   Portable Chest Xray  10/19/2013   CLINICAL DATA:  Endotracheal tube placement.  EXAM: PORTABLE CHEST - 1 VIEW  COMPARISON:  Same day.  FINDINGS: Stable cardiomegaly. Interval placement of nasogastric tubes seen in proximal stomach. Interval placement of endotracheal tube which is in grossly good position with distal tip 3 cm above the carinal. Bilateral pulmonary edema is noted with associated pleural effusions. No pneumothorax is noted.  IMPRESSION: Continued presence of cardiomegaly and bilateral pulmonary edema consistent with congestive heart failure with associated pleural effusions. Endotracheal and nasogastric tubes are in grossly good position.   Electronically Signed   By: Roque Lias M.D.   On: 10/19/2013 12:49   Dg Chest Port 1 View  10/19/2013   CLINICAL DATA:  Shortness of breath, altered  mental status, weakness  EXAM: PORTABLE CHEST - 1 VIEW  COMPARISON:  Prior radiograph from 09/10/2013  FINDINGS: Mild cardiomegaly is stable as compared to the prior examination.  Lung volumes are within normal limits. There is diffuse pulmonary vascular congestion with interstitial thickening, compatible with pulmonary edema. Moderate bilateral pleural effusions are present. More confluent opacity at the left lung base likely represents edema and/ or atelectasis, although possible focal infiltrate is not excluded. No pneumothorax.  Osseous structures are within normal limits.  IMPRESSION: Diffuse pulmonary edema with moderate bilateral pleural effusions. More confluent opacity at the left lung base is favored to represent edema and/ or atelectasis, although a superimposed infiltrate could also have this appearance in the correct clinical setting.   Electronically Signed   By: Rise Mu M.D.   On: 10/19/2013 05:52    Scheduled Meds: . albuterol  2.5 mg Nebulization Q6H  . allopurinol  100 mg Oral Daily  . antiseptic oral rinse  15 mL Mouth Rinse QID  . aspirin EC  81 mg Oral Daily  . atorvastatin  40 mg Oral q1800  . ceFEPime (MAXIPIME) IV  1 g Intravenous Q24H  . chlorhexidine  15 mL Mouth Rinse BID  . fentaNYL  50 mcg Intravenous Once  . heparin  5,000 Units Subcutaneous Q8H  . hydrALAZINE  50 mg Oral TID  . insulin aspart  0-15 Units Subcutaneous Q4H  . ipratropium  0.5 mg Nebulization Q6H  . metoprolol  50 mg Oral BID  . pantoprazole (PROTONIX) IV  40 mg Intravenous Q12H  . sodium chloride  3 mL Intravenous Q12H  . vancomycin  1,000 mg Intravenous Q48H   Continuous Infusions: . fentaNYL infusion INTRAVENOUS 5 mcg/hr (10/19/13 1346)  . furosemide (LASIX) infusion 4 mg/hr (10/19/13 2200)    Active Problems:   MI, acute, non ST segment elevation   Aortic stenosis   CKD (chronic kidney disease) stage 3, GFR 30-59 ml/min   DM (diabetes mellitus) type II uncontrolled with renal  manifestation   Acute on chronic diastolic CHF (congestive heart failure), NYHA class 4   Acute respiratory failure   Anemia of chronic disease   HCAP (healthcare-associated pneumonia)    Time spent: critical care:    Candise Crabtree  [facility] Pager [phone]. If 7PM-7AM, please contact night-coverage at [email or web address], password [facility] 10/20/2013, 10:39 AM  LOS: 1 day

## 2013-10-20 NOTE — Consult Note (Signed)
Consult requested by: Dr. Kerry Hough Consult requested for respiratory failure:  HPI: This is a 79 year old who came to the emergency department with increasing shortness of breath. He has a history of diastolic CHF coronary artery occlusive disease aortic stenosis chronic kidney disease in GI bleeding from AVMs. The history is from the medical record because he is intubated and unable to give me any history. When he came to the emergency department he was noted to be in respiratory distress and was placed on BiPAP. His chest x-ray showed pulmonary edema. EKG showed ST depression and T-wave inversion and troponin level was elevated at 0.16. He was given Lasix and improved and was noted to be more anemic as well. No beds were available at [facility] so he was admitted here. He self so quickly developed increasing problems with his breathing and ended up having to be intubated and placed on mechanical ventilation. Since then his troponin has increased. He had a previous non-STEMI in September of this year.  Past Medical History  Diagnosis Date  . Diabetes mellitus   . Hypertension   . Arthritis   . Prostate cancer   . Gout   . Coronary artery disease     a. presumed, s/p NSTEMI 07/2013-->Med Rx  . GERD (gastroesophageal reflux disease)   . CKD (chronic kidney disease), stage III   . Iron deficiency anemia     a. 07/2013 s/p multiple PRBC's.  . Heme positive stool   . Moderate to severe aortic stenosis     a.  07/2013 Echo: EF50-55%, no rwma, mod-sev AS (0.78cm^2 VTI, 0.73cm^2 Vmax), mild AI, Mild MR, mod dil LA, PASP     Family History  Problem Relation Age of Onset  . Cancer Mother     Deceased  . Heart failure Sister     Deceased  . Heart failure Brother     Deceased  . Hypertension Mother     Deceased  . Hypertension Father      History   Social History  . Marital Status: Divorced    Spouse Name: N/A    Number of Children: N/A  . Years of Education: N/A   Social  History Main Topics  . Smoking status: Former Games developer  . Smokeless tobacco: None  . Alcohol Use: No  . Drug Use: No  . Sexual Activity: No   Other Topics Concern  . None   Social History Narrative   Lives in [place] by himself.  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     ROS: Not obtainable    Objective: Vital signs in last 24 hours: Temp:  [96.7 F (35.9 C)-99.4 F (37.4 C)] 99.4 F (37.4 C) (11/24 0722) Pulse Rate:  [87-101] 89 (11/24 0400) Resp:  [14-26] 14 (11/24 0400) BP: (99-150)/(6-92) 129/48 mmHg (11/24 0722) SpO2:  [85 %-100 %] 94 % (11/24 0400) FiO2 (%):  [40 %] 40 % (11/24 0722) Weight:  [66.8 kg (147 lb 4.3 oz)-67.586 kg (149 lb)] 66.8 kg (147 lb 4.3 oz) (11/24 0500) Weight change:     Intake/Output from previous day: 11/23 0701 - 11/24 0700 In: 1930.2 [I.V.:1083.5; Blood:596.7; IV Piggyback:250] Out: 800 [Urine:800]  PHYSICAL EXAM He is intubated and on the ventilator. He is sedated but apparently has been moving all 4 extremities. Mucous membranes are moist. His neck is supple and he does have jugular venous distention to the angle of the jaw when he is lying flat. His chest  shows rhonchi and fine rales mostly in the bases. His heart is regular with a harsh systolic murmur. His abdomen is soft without masses bowel sounds are present. As mentioned he does move all 4 extremities.  Lab Results: Basic Metabolic Panel:  Recent Labs  16/10/96 1525 10/20/13 0408  NA 134* 138  K 4.9 4.3  CL 101 103  CO2 10* 23  GLUCOSE 260* 168*  BUN 29* 34*  CREATININE 2.28* 2.83*  CALCIUM 9.3 9.5   Liver Function Tests:  Recent Labs  10/20/13 0408  AST 45*  ALT 21  ALKPHOS 65  BILITOT 0.6  PROT 6.5  ALBUMIN 3.0*   No results found for this basename: LIPASE, AMYLASE,  in the last 72 hours No results found for this basename: AMMONIA,  in the last 72 hours CBC:  Recent Labs  10/19/13 0544 10/20/13 0408  WBC 11.1* 8.9   NEUTROABS 9.4*  --   HGB 7.2* 8.5*  HCT 24.4* 26.5*  MCV 77.0* 78.2  PLT 258 207   Cardiac Enzymes:  Recent Labs  10/19/13 1127 10/19/13 2131 10/20/13 0408  TROPONINI 2.13* 7.14* 10.54*   BNP:  Recent Labs  10/19/13 0643  PROBNP 1806.0*   D-Dimer: No results found for this basename: DDIMER,  in the last 72 hours CBG:  Recent Labs  10/19/13 1109 10/19/13 1618 10/19/13 1941 10/20/13 0014 10/20/13 0413 10/20/13 0715  GLUCAP 176* 237* 174* 174* 164* 119*   Hemoglobin A1C: No results found for this basename: HGBA1C,  in the last 72 hours Fasting Lipid Panel: No results found for this basename: CHOL, HDL, LDLCALC, TRIG, CHOLHDL, LDLDIRECT,  in the last 72 hours Thyroid Function Tests: No results found for this basename: TSH, T4TOTAL, FREET4, T3FREE, THYROIDAB,  in the last 72 hours Anemia Panel: No results found for this basename: VITAMINB12, FOLATE, FERRITIN, TIBC, IRON, RETICCTPCT,  in the last 72 hours Coagulation: No results found for this basename: LABPROT, INR,  in the last 72 hours Urine Drug Screen: Drugs of Abuse  No results found for this basename: labopia, cocainscrnur, labbenz, amphetmu, thcu, labbarb    Alcohol Level: No results found for this basename: ETH,  in the last 72 hours Urinalysis: No results found for this basename: COLORURINE, APPERANCEUR, LABSPEC, PHURINE, GLUCOSEU, HGBUR, BILIRUBINUR, KETONESUR, PROTEINUR, UROBILINOGEN, NITRITE, LEUKOCYTESUR,  in the last 72 hours Misc. Labs:   ABGS:  Recent Labs  10/20/13 0530  PHART 7.441  PO2ART 82.3  TCO2 20.2  HCO3 21.9     MICROBIOLOGY: Recent Results (from the past 240 hour(s))  MRSA PCR SCREENING     Status: None   Collection Time    10/19/13 11:25 AM      Result Value Range Status   MRSA by PCR NEGATIVE  NEGATIVE Final   Comment:            The GeneXpert MRSA Assay (FDA     approved for NASAL specimens     only), is one component of a     comprehensive MRSA colonization      surveillance program. It is not     intended to diagnose MRSA     infection nor to guide or     monitor treatment for     MRSA infections.  CULTURE, BLOOD (ROUTINE X 2)     Status: None   Collection Time    10/19/13  9:31 PM      Result Value Range Status   Specimen Description Blood BLOOD RIGHT WRIST  Final   Special Requests BOTTLES DRAWN AEROBIC AND ANAEROBIC 8CC   Final   Culture PENDING   Incomplete   Report Status PENDING   Incomplete  CULTURE, BLOOD (ROUTINE X 2)     Status: None   Collection Time    10/19/13  9:45 PM      Result Value Range Status   Specimen Description Blood BLOOD RIGHT HAND   Final   Special Requests BOTTLES DRAWN AEROBIC AND ANAEROBIC [facility]   Final   Culture PENDING   Incomplete   Report Status PENDING   Incomplete    Studies/Results: Nm Pulmonary Perfusion  10/19/2013   CLINICAL DATA:  Shortness of breath. Respiratory failure and renal failure.  EXAM: NUCLEAR MEDICINE PULMONARY PERFUSION SCAN  TECHNIQUE: Perfusion images were obtained in multiple projections after intravenous injection of radiopharmaceutical.  COMPARISON:  CHEST RADIOGRAPH ON 10/19/2013  RADIOPHARMACEUTICALS:  6 mCi Tc72m MAA  FINDINGS: A nonsegmental perfusion defect is seen in the left lung corresponding to pleural fluid within the major fissure seen radiographically. No segmental pulmonary perfusion defects identified in either lung.  IMPRESSION: No segmental pulmonary perfusion defects identified.   Electronically Signed   By: Myles Rosenthal M.D.   On: 10/19/2013 21:44   Portable Chest Xray  10/19/2013   CLINICAL DATA:  Endotracheal tube placement.  EXAM: PORTABLE CHEST - 1 VIEW  COMPARISON:  Same day.  FINDINGS: Stable cardiomegaly. Interval placement of nasogastric tubes seen in proximal stomach. Interval placement of endotracheal tube which is in grossly good position with distal tip 3 cm above the carinal. Bilateral pulmonary edema is noted with associated pleural effusions. No  pneumothorax is noted.  IMPRESSION: Continued presence of cardiomegaly and bilateral pulmonary edema consistent with congestive heart failure with associated pleural effusions. Endotracheal and nasogastric tubes are in grossly good position.   Electronically Signed   By: Roque Lias M.D.   On: 10/19/2013 12:49   Dg Chest Port 1 View  10/19/2013   CLINICAL DATA:  Shortness of breath, altered mental status, weakness  EXAM: PORTABLE CHEST - 1 VIEW  COMPARISON:  Prior radiograph from 09/10/2013  FINDINGS: Mild cardiomegaly is stable as compared to the prior examination.  Lung volumes are within normal limits. There is diffuse pulmonary vascular congestion with interstitial thickening, compatible with pulmonary edema. Moderate bilateral pleural effusions are present. More confluent opacity at the left lung base likely represents edema and/ or atelectasis, although possible focal infiltrate is not excluded. No pneumothorax.  Osseous structures are within normal limits.  IMPRESSION: Diffuse pulmonary edema with moderate bilateral pleural effusions. More confluent opacity at the left lung base is favored to represent edema and/ or atelectasis, although a superimposed infiltrate could also have this appearance in the correct clinical setting.   Electronically Signed   By: Rise Mu M.D.   On: 10/19/2013 05:52    Medications:  Scheduled: . albuterol  2.5 mg Nebulization Q6H  . allopurinol  100 mg Oral Daily  . antiseptic oral rinse  15 mL Mouth Rinse QID  . aspirin EC  81 mg Oral Daily  . atorvastatin  40 mg Oral q1800  . ceFEPime (MAXIPIME) IV  1 g Intravenous Q24H  . chlorhexidine  15 mL Mouth Rinse BID  . fentaNYL  50 mcg Intravenous Once  . heparin  5,000 Units Subcutaneous Q8H  . hydrALAZINE  50 mg Oral TID  . insulin aspart  0-15 Units Subcutaneous Q4H  . ipratropium  0.5 mg Nebulization Q6H  . metoprolol  50 mg Oral BID  . pantoprazole (PROTONIX) IV  40 mg Intravenous Q12H  . sodium  chloride  3 mL Intravenous Q12H  . vancomycin  1,000 mg Intravenous Q48H   Continuous: . fentaNYL infusion INTRAVENOUS 5 mcg/hr (10/19/13 1346)  . furosemide (LASIX) infusion 4 mg/hr (10/19/13 2200)  .  sodium bicarbonate  infusion 1000 mL 75 mL/hr at 10/19/13 1830   ZOX:WRUEAV chloride, acetaminophen, acetaminophen, albuterol, fentaNYL, metoprolol, morphine injection, ondansetron (ZOFRAN) IV, ondansetron, sodium chloride  Assesment: He has acute respiratory failure on the basis of his cardiac disease I think. He had pulmonary edema. He has multiple other medical problems including coronary disease probably with another non-STEMI now, aortic stenosis chronic kidney disease and chronic GI bleeding. Chest x-ray this morning shows little improvement. Active Problems:   MI, acute, non ST segment elevation   Aortic stenosis   CKD (chronic kidney disease) stage 3, GFR 30-59 ml/min   DM (diabetes mellitus) type II uncontrolled with renal manifestation   Acute on chronic diastolic CHF (congestive heart failure), NYHA class 4   Acute respiratory failure   Anemia of chronic disease   HCAP (healthcare-associated pneumonia)    Plan: I don't think we have any opportunity for any effort at trying to get him off the ventilator right now considering the appearance of his chest x-ray.    LOS: 1 day   Maxwell Martorano L 10/20/2013, 7:42 AM

## 2013-10-20 NOTE — Progress Notes (Signed)
UR chart review completed.  

## 2013-10-20 NOTE — Consult Note (Signed)
The patient was seen and examined, and I agree with the assessment and plan as documented above, with modifications as noted below. The patient has been admitted with a NSTEMI, severe anemia (presumed GI bleed from AVM's s/p 2 units PRBC transfusion), CKD, and likely severe aortic stenosis with superimposed CHF. He had a decompensation in his respiratory status requiring intubation. He is concomitantly being treated for HCAP with antimicrobial therapy. He has had minimal urine output with both IV Lasix and now with a Lasix infusion at 4 mg/hr. The patient nods and shakes head in response to questions, and currently denies chest pain/pressure. I tried talking to him about his desire with respect to code status and invasive procedures, but he did not respond to these questions. No family members are currently present, but a nurse told me that a daughter from [place] is planning to drive here. His prognosis is very poor given his multiple comorbidities. His severe aortic stenosis alone places him at high risk for future major adverse cardiac events, and his current NSTEMI only increases this risk. Due to severe anemia, he is only being treated with ASA 325 mg daily with respect to antiplatelet therapy. He is on Lipitor, hydralazine, and metoprolol. A limited echocardiogram to reassess LV systolic function is pending (previously normal in 07/2013). His BP is currently stable. I will increase his Lasix drip to 8 mg/hr with close monitoring of his renal function. I will also initiate isosorbide dinitrate 10 mg tid. He is not a good candidate for coronary angiography, given his multiple comorbidities (CKD, anemia, HCAP). He is at risk for contrast-induced nephropathy with a risk for becoming hemodialysis-dependent. There is also an inability to add additional antiplatelet therapy due to severe anemia.  I feel that this patient should be considered for hospice/palliative care, given the aforementioned reasons. In  the meantime, I will try to control symptoms and address disease status with medical management.

## 2013-10-20 NOTE — Progress Notes (Signed)
Dr. Sharl Ma was paged about critical Troponin 10.54. No new orders or call back received. Continue to monitor the pt and current plan of treatment.

## 2013-10-20 NOTE — Care Management Note (Addendum)
Page 1 of 2   10/28/2013     2:53:55 PM   CARE MANAGEMENT NOTE 10/28/2013  Patient:  [name], [name]   Account Number:  [id number]  Date Initiated:  10/20/2013  Documentation initiated by:  Sharrie Rothman  Subjective/Objective Assessment:   Pt admitted from home with respiratory distress due to CHF. Pt is currently intubated and no family present at this time. Per chart pt lives alone but has family support.     Action/Plan:   Will continue to follow for discharge planning needs. Will attempt to talk with family when available.   Anticipated DC Date:  10/27/2013   Anticipated DC Plan:  HOME W HOME HEALTH SERVICES  In-house referral  Clinical Social Worker      DC Planning Services  CM consult      Choice offered to / List presented to:     DME arranged  HOSPITAL BED  [facility]      DME agency  [facility]     HH arranged  HH-1 RN      [facility] agency  [facility]   Status of service:  Completed, signed off Medicare Important Message given?  YES (If response is "NO", the following Medicare IM given date fields will be blank) Date Medicare IM given:  10/24/2013 Date Additional Medicare IM given:  10/28/2013  Discharge Disposition:  HOME W HOSPICE CARE  Per UR Regulation:    If discussed at Long Length of Stay Meetings, dates discussed:   10/28/2013    Comments:  10/28/13 11:20 Christy Friede Leanord Hawking RN BSN CM There has been a good deal of confusion regarding the disposition of this patient. Family has tried to do a lot of the DC detail work and CSW and CM have tried to stay actively involved. Per CSW Manuela Schwartz, pt is going to his Daughter's home with hospice referral. Per Daughter, pt is going to the Hospice Residentail facility. CM spoke to [place] at [facility] and pt is going to to daughter Charlotte's home and they will be touching base tomorrow morning. Candelaria Stagers RN states that the transport believes pt is going to a [facility]. CM called Daughter  Claris Gower and left message. Conversation witnessed by [place] CSW. Left message that the Transport co needs to know Daughter's address will be the destination. Per Thurston Hole CSW, Daughter states that [facility] is setting up to deliver hospital bed and O2. 11:45 [facility] RN Misty Jaysen called to ask about POA. CM let her know that she intercepted a fax from an attorney and gave it to the patients daughter regarding POA but I did not know if it was in effect. Misty Jansen indicated to CM that Claris Gower stated to her that [facility] continued to put stumbling blocks in the way for DC and that she was going to engage an attorney. CM told Misty Gid that she would contact Daughter to see how we could facilitate. Hospice RN to go to home tomorrow morning. Called Claris Gower who was very abrupt and stated she had everything handled for pt to come to her home. CM asked if there were anyother details to  assist with and she stated that she was the POA and she would be making all decisions. She then hung up abruptly. 1430 Per Candelaria Stagers RN, family states they are upset with CM Kapena Hamme because I told them that there was a Hospice Bed and then said there was not one. CM does not offer Hospice home beds. CM sets up Hospice with the  pt is returning to home. 1435 CM called [facility] to confirm that Hospital bed and O2 was ready when pt arrived in [place]. Okey Regal with [facility] states the [place] office was handling the bed but she saw no order for O2 and needed documentation. Order and sats documentation faxed to Benay Pike, Sr. Sales Associate.She will send orders to Okey Regal who will make sure [place] office is aware and handles O2.  1300 Asked RN Candelaria Stagers to update the O2 sats at rest. Stated Sats dropped to 86 % and rebounded to 93% with O2.  10/24/13 Srihan Brutus RN BSN CM Pt's Daughter has elected to transport pt to [place]. DME orders faxed to [facility]. RN checking if transport has O2. 4:25 Family decided to contact Hospice. MaryBeth at hospice will be calling Daughter to arrange  services.  10/21/13 1350 Arlyss Queen, RN BSN CM Pt off ventilator but not doing well. Pt is now on bipap. Familyis aware that pt is not doing well. Will continue to follow for discharge plannning needs.  10/20/13 1500 Arlyss Queen, RN BSN CM

## 2013-10-20 NOTE — Progress Notes (Signed)
CRITICAL VALUE ALERT  Critical value received: Troponin 7.14  Date of notification:  10/19/13  Time of notification:  2240  Critical value read back:yes  Nurse who received alert: Marylu Lund  MD notified (1st page):  Lenny Pastel  Time of first page:  2250  MD notified (2nd page): Dr. Sharl Ma  Time of second page: 2250  Responding MD:  Lenny Pastel, Dr. Sharl Ma  Time MD responded:  2250

## 2013-10-21 ENCOUNTER — Inpatient Hospital Stay (HOSPITAL_COMMUNITY): Payer: Medicare Other

## 2013-10-21 DIAGNOSIS — I5041 Acute combined systolic (congestive) and diastolic (congestive) heart failure: Secondary | ICD-10-CM

## 2013-10-21 DIAGNOSIS — D509 Iron deficiency anemia, unspecified: Secondary | ICD-10-CM

## 2013-10-21 DIAGNOSIS — I359 Nonrheumatic aortic valve disorder, unspecified: Secondary | ICD-10-CM

## 2013-10-21 DIAGNOSIS — E43 Unspecified severe protein-calorie malnutrition: Secondary | ICD-10-CM

## 2013-10-21 DIAGNOSIS — N184 Chronic kidney disease, stage 4 (severe): Secondary | ICD-10-CM

## 2013-10-21 LAB — CBC
HCT: 27.1 % — ABNORMAL LOW (ref 39.0–52.0)
Hemoglobin: 8.6 g/dL — ABNORMAL LOW (ref 13.0–17.0)
MCH: 24.9 pg — ABNORMAL LOW (ref 26.0–34.0)
MCHC: 31.7 g/dL (ref 30.0–36.0)
Platelets: 203 10*3/uL (ref 150–400)
RBC: 3.45 MIL/uL — ABNORMAL LOW (ref 4.22–5.81)

## 2013-10-21 LAB — BLOOD GAS, ARTERIAL
Bicarbonate: 27.8 mEq/L — ABNORMAL HIGH (ref 20.0–24.0)
Drawn by: 21310
O2 Saturation: 93.7 %
PEEP: 5 cmH2O
Patient temperature: 37
RATE: 14 resp/min
TCO2: 26 mmol/L (ref 0–100)
pH, Arterial: 7.471 — ABNORMAL HIGH (ref 7.350–7.450)
pO2, Arterial: 69.1 mmHg — ABNORMAL LOW (ref 80.0–100.0)

## 2013-10-21 LAB — BASIC METABOLIC PANEL
BUN: 33 mg/dL — ABNORMAL HIGH (ref 6–23)
CO2: 28 mEq/L (ref 19–32)
Calcium: 9.6 mg/dL (ref 8.4–10.5)
GFR calc non Af Amer: 19 mL/min — ABNORMAL LOW (ref >90)
Glucose, Bld: 103 mg/dL — ABNORMAL HIGH (ref 70–99)
Sodium: 140 mEq/L (ref 135–145)

## 2013-10-21 LAB — GLUCOSE, CAPILLARY
Glucose-Capillary: 104 mg/dL — ABNORMAL HIGH (ref 70–99)
Glucose-Capillary: 91 mg/dL (ref 70–99)
Glucose-Capillary: 170 mg/dL — ABNORMAL HIGH (ref 70–99)

## 2013-10-21 MED ORDER — LORAZEPAM 2 MG/ML IJ SOLN
0.5000 mg | Freq: Once | INTRAMUSCULAR | Status: AC
  Administered 2013-10-21: 0.5 mg via INTRAVENOUS

## 2013-10-21 MED ORDER — LORAZEPAM 2 MG/ML IJ SOLN
INTRAMUSCULAR | Status: AC
  Filled 2013-10-21: qty 1

## 2013-10-21 MED ORDER — LORAZEPAM 2 MG/ML IJ SOLN
0.5000 mg | Freq: Once | INTRAMUSCULAR | Status: AC
  Administered 2013-10-21: 0.5 mg via INTRAVENOUS
  Filled 2013-10-21: qty 1

## 2013-10-21 NOTE — Progress Notes (Signed)
Pt placed on NIV 12/5 70% due to increased work of breathing and increased anxiety. Pt tolerating well at this time.  Pt is breathing much easier and has calmed down.  RT will continue to monitor.

## 2013-10-21 NOTE — Progress Notes (Signed)
Patient off of the vent.  Fentanyl 240mg  wasted in the sink and witnessed by Tory Emerald, RN.

## 2013-10-21 NOTE — Progress Notes (Signed)
*  PRELIMINARY RESULTS* Echocardiogram 2D Echocardiogram limited has been performed.  [name] 10/21/2013, 10:29 AM

## 2013-10-21 NOTE — Progress Notes (Signed)
Pt placed on SBT 5/5 and tolerating well at this time.  RT will continue to monitor.  

## 2013-10-21 NOTE — Progress Notes (Signed)
Subjective: Is more alert. His chest x-ray has improved and he seems to be breathing better.  Objective: Vital signs in last 24 hours: Temp:  [97.8 F (36.6 C)-101.4 F (38.6 C)] 97.8 F (36.6 C) (11/25 0800) Pulse Rate:  [67-90] 77 (11/25 0800) Resp:  [9-23] 16 (11/25 0800) BP: (93-138)/(39-113) 128/57 mmHg (11/25 0800) SpO2:  [66 %-100 %] 100 % (11/25 0800) FiO2 (%):  [40 %] 40 % (11/25 0808) Weight:  [69 kg (152 lb 1.9 oz)] 69 kg (152 lb 1.9 oz) (11/25 0500) Weight change: 1.414 kg (3 lb 1.9 oz)    Intake/Output from previous day: 11/24 0701 - 11/25 0700 In: 638.2 [I.V.:588.2; IV Piggyback:50] Out: 2000 [Urine:2000]  PHYSICAL EXAM General appearance: alert, mild distress and Intubated and sedated Resp: clear to auscultation bilaterally Cardio: regular rate and rhythm, S1, S2 normal, no murmur, click, rub or gallop GI: soft, non-tender; bowel sounds normal; no masses,  no organomegaly Extremities: extremities normal, atraumatic, no cyanosis or edema  Lab Results:    Basic Metabolic Panel:  Recent Labs  40/98/11 0408 10/21/13 0416  NA 138 140  K 4.3 3.6  CL 103 102  CO2 23 28  GLUCOSE 168* 103*  BUN 34* 33*  CREATININE 2.83* 2.76*  CALCIUM 9.5 9.6   Liver Function Tests:  Recent Labs  10/20/13 0408  AST 45*  ALT 21  ALKPHOS 65  BILITOT 0.6  PROT 6.5  ALBUMIN 3.0*   No results found for this basename: LIPASE, AMYLASE,  in the last 72 hours No results found for this basename: AMMONIA,  in the last 72 hours CBC:  Recent Labs  10/19/13 0544 10/20/13 0408 10/21/13 0416  WBC 11.1* 8.9 9.5  NEUTROABS 9.4*  --   --   HGB 7.2* 8.5* 8.6*  HCT 24.4* 26.5* 27.1*  MCV 77.0* 78.2 78.6  PLT 258 207 203   Cardiac Enzymes:  Recent Labs  10/19/13 1127 10/19/13 2131 10/20/13 0408  TROPONINI 2.13* 7.14* 10.54*   BNP:  Recent Labs  10/19/13 0643  PROBNP 1806.0*   D-Dimer: No results found for this basename: DDIMER,  in the last 72  hours CBG:  Recent Labs  10/20/13 1125 10/20/13 1607 10/20/13 1957 10/20/13 2355 10/21/13 0352 10/21/13 0751  GLUCAP 133* 127* 105* 114* 104* 91   Hemoglobin A1C: No results found for this basename: HGBA1C,  in the last 72 hours Fasting Lipid Panel: No results found for this basename: CHOL, HDL, LDLCALC, TRIG, CHOLHDL, LDLDIRECT,  in the last 72 hours Thyroid Function Tests: No results found for this basename: TSH, T4TOTAL, FREET4, T3FREE, THYROIDAB,  in the last 72 hours Anemia Panel: No results found for this basename: VITAMINB12, FOLATE, FERRITIN, TIBC, IRON, RETICCTPCT,  in the last 72 hours Coagulation: No results found for this basename: LABPROT, INR,  in the last 72 hours Urine Drug Screen: Drugs of Abuse  No results found for this basename: labopia, cocainscrnur, labbenz, amphetmu, thcu, labbarb    Alcohol Level: No results found for this basename: ETH,  in the last 72 hours Urinalysis: No results found for this basename: COLORURINE, APPERANCEUR, LABSPEC, PHURINE, GLUCOSEU, HGBUR, BILIRUBINUR, KETONESUR, PROTEINUR, UROBILINOGEN, NITRITE, LEUKOCYTESUR,  in the last 72 hours Misc. Labs:  ABGS  Recent Labs  10/21/13 0415  PHART 7.471*  PO2ART 69.1*  TCO2 26.0  HCO3 27.8*   CULTURES Recent Results (from the past 240 hour(s))  MRSA PCR SCREENING     Status: None   Collection Time    10/19/13  11:25 AM      Result Value Range Status   MRSA by PCR NEGATIVE  NEGATIVE Final   Comment:            The GeneXpert MRSA Assay (FDA     approved for NASAL specimens     only), is one component of a     comprehensive MRSA colonization     surveillance program. It is not     intended to diagnose MRSA     infection nor to guide or     monitor treatment for     MRSA infections.  CULTURE, BLOOD (ROUTINE X 2)     Status: None   Collection Time    10/19/13  9:31 PM      Result Value Range Status   Specimen Description Blood BLOOD RIGHT WRIST   Final   Special Requests  BOTTLES DRAWN AEROBIC AND ANAEROBIC 8CC   Final   Culture NO GROWTH 1 DAY   Final   Report Status PENDING   Incomplete  CULTURE, BLOOD (ROUTINE X 2)     Status: None   Collection Time    10/19/13  9:45 PM      Result Value Range Status   Specimen Description Blood BLOOD RIGHT HAND   Final   Special Requests BOTTLES DRAWN AEROBIC AND ANAEROBIC 6CC   Final   Culture NO GROWTH 1 DAY   Final   Report Status PENDING   Incomplete   Studies/Results: Nm Pulmonary Perfusion  10/19/2013   CLINICAL DATA:  Shortness of breath. Respiratory failure and renal failure.  EXAM: NUCLEAR MEDICINE PULMONARY PERFUSION SCAN  TECHNIQUE: Perfusion images were obtained in multiple projections after intravenous injection of radiopharmaceutical.  COMPARISON:  CHEST RADIOGRAPH ON 10/19/2013  RADIOPHARMACEUTICALS:  6 mCi Tc57m MAA  FINDINGS: A nonsegmental perfusion defect is seen in the left lung corresponding to pleural fluid within the major fissure seen radiographically. No segmental pulmonary perfusion defects identified in either lung.  IMPRESSION: No segmental pulmonary perfusion defects identified.   Electronically Signed   By: Myles Rosenthal M.D.   On: 10/19/2013 21:44   Dg Chest Port 1 View  10/21/2013   CLINICAL DATA:  Respiratory failure  EXAM: PORTABLE CHEST - 1 VIEW  COMPARISON:  10/20/2013  FINDINGS: There is elevation of the right hemidiaphragm. Prominence of interstitial markings appreciated. Increased density projects in the right left lung bases there is blunting of the costophrenic angles. The cardiac silhouette is enlarged. Endotracheal tube is appreciated with tip approximately 4.5 cm above the carina. Calcifications identified within the cord to the aorta. The visualized osseous structures unremarkable.  IMPRESSION: Interstitial infiltrate which is decreased in conspicuity when compared to the previous study. The likely reflecting decreased pulmonary edema. There is blunting of the costophrenic angles may  represent small effusions. The densities within the lung bases of also decreased in conspicuity again likely right reflecting improved edema. Continued surveillance evaluation recommended. Sign report   Electronically Signed   By: Salome Holmes M.D.   On: 10/21/2013 08:43   Portable Chest Xray In Am  10/20/2013   CLINICAL DATA:  History shortness of breath.  EXAM: PORTABLE CHEST - 1 VIEW  COMPARISON:  10/19/2013  FINDINGS: Endotracheal tube is 3.8 cm above the carina. There are increased basilar densities suggestive for pleural fluid and probable pulmonary edema. The heart is obscured by the basilar chest densities. Nasogastric tube extends into the abdomen.  IMPRESSION: Increased pleural and parenchymal densities are suggestive for pulmonary  edema and pleural effusions.  Support apparatuses as described.   Electronically Signed   By: Richarda Overlie M.D.   On: 10/20/2013 07:52   Portable Chest Xray  10/19/2013   CLINICAL DATA:  Endotracheal tube placement.  EXAM: PORTABLE CHEST - 1 VIEW  COMPARISON:  Same day.  FINDINGS: Stable cardiomegaly. Interval placement of nasogastric tubes seen in proximal stomach. Interval placement of endotracheal tube which is in grossly good position with distal tip 3 cm above the carinal. Bilateral pulmonary edema is noted with associated pleural effusions. No pneumothorax is noted.  IMPRESSION: Continued presence of cardiomegaly and bilateral pulmonary edema consistent with congestive heart failure with associated pleural effusions. Endotracheal and nasogastric tubes are in grossly good position.   Electronically Signed   By: Roque Lias M.D.   On: 10/19/2013 12:49    Medications:  Scheduled: . albuterol  2.5 mg Nebulization Q6H  . allopurinol  100 mg Oral Daily  . antiseptic oral rinse  15 mL Mouth Rinse QID  . aspirin  325 mg Oral Daily  . atorvastatin  40 mg Oral q1800  . ceFEPime (MAXIPIME) IV  1 g Intravenous Q24H  . chlorhexidine  15 mL Mouth Rinse BID  .  fentaNYL  50 mcg Intravenous Once  . heparin  5,000 Units Subcutaneous Q8H  . hydrALAZINE  50 mg Oral TID  . insulin aspart  0-15 Units Subcutaneous Q4H  . ipratropium  0.5 mg Nebulization Q6H  . isosorbide dinitrate  10 mg Oral TID  . metoprolol  50 mg Oral BID  . pantoprazole (PROTONIX) IV  40 mg Intravenous Q12H  . sodium chloride  3 mL Intravenous Q12H  . vancomycin  1,000 mg Intravenous Q48H   Continuous: . fentaNYL infusion INTRAVENOUS 150 mcg/hr (10/21/13 0711)  . furosemide (LASIX) infusion 8 mg/hr (10/20/13 1800)   WUJ:WJXBJY chloride, acetaminophen, acetaminophen, albuterol, fentaNYL, metoprolol, morphine injection, ondansetron (ZOFRAN) IV, ondansetron, sodium chloride  Assesment: He was admitted with acute respiratory failure. He has pulmonary edema. He had a non-STEMI. He has severe aortic stenosis. He has improved and I think he's going to be able to come off the ventilator today Active Problems:   MI, acute, non ST segment elevation   Aortic stenosis   CKD (chronic kidney disease) stage 3, GFR 30-59 ml/min   DM (diabetes mellitus) type II uncontrolled with renal manifestation   Acute on chronic diastolic CHF (congestive heart failure), NYHA class 4   Acute respiratory failure   Anemia of chronic disease   HCAP (healthcare-associated pneumonia)    Plan: Attempt extubation later    LOS: 2 days   Tisha Cline L 10/21/2013, 8:48 AM

## 2013-10-21 NOTE — Progress Notes (Signed)
SUBJECTIVE: Mr. Rotundo denies chest pain. He asks, "Am I dying?". He also asked about his family members. He coughed up some dark yellow phlegm during my exam. He responds to questions appropriately and is more alert today. He has been extubated. When he removed his oxygen to cough, his saturations dropped to 81%.     Intake/Output Summary (Last 24 hours) at 10/21/13 1028 Last data filed at 10/21/13 0900  Gross per 24 hour  Intake 639.17 ml  Output   2000 ml  Net -1360.83 ml    Current Facility-Administered Medications  Medication Dose Route Frequency Provider Last Rate Last Dose  . 0.9 %  sodium chloride infusion  250 mL Intravenous PRN Erick Blinks, MD      . acetaminophen (TYLENOL) tablet 650 mg  650 mg Oral Q6H PRN Erick Blinks, MD       Or  . acetaminophen (TYLENOL) suppository 650 mg  650 mg Rectal Q6H PRN Erick Blinks, MD   650 mg at 10/21/13 0401  . albuterol (PROVENTIL) (5 MG/ML) 0.5% nebulizer solution 2.5 mg  2.5 mg Nebulization Q6H Erick Blinks, MD   2.5 mg at 10/21/13 0756  . albuterol (PROVENTIL) (5 MG/ML) 0.5% nebulizer solution 2.5 mg  2.5 mg Nebulization Q2H PRN Erick Blinks, MD   2.5 mg at 10/19/13 1152  . allopurinol (ZYLOPRIM) tablet 100 mg  100 mg Oral Daily Erick Blinks, MD   100 mg at 10/20/13 1012  . antiseptic oral rinse (BIOTENE) solution 15 mL  15 mL Mouth Rinse QID Erick Blinks, MD   15 mL at 10/21/13 0401  . aspirin tablet 325 mg  325 mg Oral Daily Erick Blinks, MD   325 mg at 10/20/13 1137  . atorvastatin (LIPITOR) tablet 40 mg  40 mg Oral q1800 Erick Blinks, MD   40 mg at 10/20/13 1759  . ceFEPIme (MAXIPIME) 1 g in dextrose 5 % 50 mL IVPB  1 g Intravenous Q24H Erick Blinks, MD   1 g at 10/20/13 1443  . chlorhexidine (PERIDEX) 0.12 % solution 15 mL  15 mL Mouth Rinse BID Erick Blinks, MD   15 mL at 10/21/13 0839  . fentaNYL (SUBLIMAZE) 10 mcg/mL in sodium chloride 0.9 % 250 mL infusion  0-400 mcg/hr Intravenous Continuous  Erick Blinks, MD   150 mcg/hr at 10/21/13 0711  . fentaNYL (SUBLIMAZE) bolus via infusion 25-50 mcg  25-50 mcg Intravenous Q1H PRN Erick Blinks, MD      . fentaNYL (SUBLIMAZE) injection 50 mcg  50 mcg Intravenous Once Erick Blinks, MD      . furosemide (LASIX) 250 mg in dextrose 5 % 250 mL infusion  8 mg/hr Intravenous Continuous Laqueta Linden, MD 8 mL/hr at 10/21/13 1006 8 mg/hr at 10/21/13 1006  . heparin injection 5,000 Units  5,000 Units Subcutaneous Q8H Erick Blinks, MD   5,000 Units at 10/21/13 [phone]  . hydrALAZINE (APRESOLINE) tablet 50 mg  50 mg Oral TID Erick Blinks, MD   50 mg at 10/20/13 2205  . insulin aspart (novoLOG) injection 0-15 Units  0-15 Units Subcutaneous Q4H Erick Blinks, MD   2 Units at 10/20/13 1623  . ipratropium (ATROVENT) nebulizer solution 0.5 mg  0.5 mg Nebulization Q6H Erick Blinks, MD   0.5 mg at 10/21/13 0756  . isosorbide dinitrate (ISORDIL) tablet 10 mg  10 mg Oral TID Laqueta Linden, MD   10 mg at 10/20/13 2204  . metoprolol (LOPRESSOR) injection 5 mg  5 mg  Intravenous Q6H PRN Meredeth Ide, MD      . metoprolol (LOPRESSOR) tablet 50 mg  50 mg Oral BID Erick Blinks, MD   50 mg at 10/20/13 2204  . morphine 2 MG/ML injection 1 mg  1 mg Intravenous Q2H PRN Erick Blinks, MD      . ondansetron (ZOFRAN) tablet 4 mg  4 mg Oral Q6H PRN Erick Blinks, MD       Or  . ondansetron (ZOFRAN) injection 4 mg  4 mg Intravenous Q6H PRN Erick Blinks, MD      . pantoprazole (PROTONIX) injection 40 mg  40 mg Intravenous Q12H Erick Blinks, MD   40 mg at 10/20/13 2200  . sodium chloride 0.9 % injection 3 mL  3 mL Intravenous Q12H Erick Blinks, MD   3 mL at 10/20/13 2205  . sodium chloride 0.9 % injection 3 mL  3 mL Intravenous PRN Erick Blinks, MD      . vancomycin (VANCOCIN) IVPB 1000 mg/200 mL premix  1,000 mg Intravenous Q48H Erick Blinks, MD   1,000 mg at 10/19/13 1336    Filed Vitals:   10/21/13 0700 10/21/13 0756 10/21/13 0800 10/21/13  0900  BP: 109/41  128/57   Pulse: 76  77 92  Temp:   97.8 F (36.6 C)   TempSrc:   Axillary   Resp: 14  16 14   Height:      Weight:      SpO2: 98% 97% 100% 92%    PHYSICAL EXAM General: NAD Neck: No JVD, no thyromegaly or thyroid nodule.  Lungs:Diminished at bases but otherwise clear to auscultation bilaterally with normal respiratory effort. CV: Nondisplaced PMI.  Regular rhythm, tachycardic, normal S1/absent S2, no S3/S4, IV/VI late -peaking ejection systolic murmur heard throughout precordium.  No pretibial edema.  No carotid bruit.  Normal pedal pulses.  Abdomen: Soft, nontender, no hepatosplenomegaly, no distention.  Neurologic: Alert and oriented x 3.  Psych: Normal affect. Extremities: No clubbing or cyanosis.   TELEMETRY: Reviewed telemetry pt in sinus tachycardia.  LABS: Basic Metabolic Panel:  Recent Labs  16/10/96 0408 10/21/13 0416  NA 138 140  K 4.3 3.6  CL 103 102  CO2 23 28  GLUCOSE 168* 103*  BUN 34* 33*  CREATININE 2.83* 2.76*  CALCIUM 9.5 9.6   Liver Function Tests:  Recent Labs  10/20/13 0408  AST 45*  ALT 21  ALKPHOS 65  BILITOT 0.6  PROT 6.5  ALBUMIN 3.0*   No results found for this basename: LIPASE, AMYLASE,  in the last 72 hours CBC:  Recent Labs  10/19/13 0544 10/20/13 0408 10/21/13 0416  WBC 11.1* 8.9 9.5  NEUTROABS 9.4*  --   --   HGB 7.2* 8.5* 8.6*  HCT 24.4* 26.5* 27.1*  MCV 77.0* 78.2 78.6  PLT 258 207 203   Cardiac Enzymes:  Recent Labs  10/19/13 1127 10/19/13 2131 10/20/13 0408  TROPONINI 2.13* 7.14* 10.54*   BNP: No components found with this basename: POCBNP,  D-Dimer: No results found for this basename: DDIMER,  in the last 72 hours Hemoglobin A1C: No results found for this basename: HGBA1C,  in the last 72 hours Fasting Lipid Panel: No results found for this basename: CHOL, HDL, LDLCALC, TRIG, CHOLHDL, LDLDIRECT,  in the last 72 hours Thyroid Function Tests: No results found for this basename: TSH,  T4TOTAL, FREET3, T3FREE, THYROIDAB,  in the last 72 hours Anemia Panel: No results found for this basename: VITAMINB12, FOLATE, FERRITIN, TIBC, IRON, RETICCTPCT,  in the last 72 hours  RADIOLOGY:  Dg Chest Port 1 View  10/21/2013   CLINICAL DATA:  Respiratory failure  EXAM: PORTABLE CHEST - 1 VIEW  COMPARISON:  10/20/2013  FINDINGS: There is elevation of the right hemidiaphragm. Prominence of interstitial markings appreciated. Increased density projects in the right left lung bases there is blunting of the costophrenic angles. The cardiac silhouette is enlarged. Endotracheal tube is appreciated with tip approximately 4.5 cm above the carina. Calcifications identified within the cord to the aorta. The visualized osseous structures unremarkable.  IMPRESSION: Interstitial infiltrate which is decreased in conspicuity when compared to the previous study. The likely reflecting decreased pulmonary edema. There is blunting of the costophrenic angles may represent small effusions. The densities within the lung bases of also decreased in conspicuity again likely right reflecting improved edema. Continued surveillance evaluation recommended. Sign report   Electronically Signed   By: Salome Holmes M.D.   On: 10/21/2013 08:43      ASSESSMENT AND PLAN: 1. NSTEMI 2. Acute systolic and diastolic heart failure 3. Severe aortic stenosis 4. Severe anemia 5. Stage 4 CKD 6. HCAP 7. GIB  The patient has been admitted with a NSTEMI, severe anemia (presumed GI bleed from AVM's s/p 2 units PRBC transfusion), stage 4 CKD, and severe aortic stenosis with superimposed CHF.  He had a decompensation in his respiratory status requiring intubation, but has subsequently been extubated. He is concomitantly being treated for HCAP with antimicrobial therapy.   His urine output has improved with a Lasix infusion at 8 mg/hr. His chest xray also shows improvement in pulmonary edema. The patient nods currently denies chest  pain/pressure.  I tried talking to him about his desire with respect to code status and invasive procedures yesterday, but he did not respond to these questions. No family members are currently present, but a nurse told me yesterday that a daughter from [place] is planning to drive here.  His prognosis is very poor given his multiple comorbidities. His severe aortic stenosis alone places him at high risk for future major adverse cardiac events, and his current NSTEMI only increases this risk.  Due to severe anemia, he is only being treated with ASA 325 mg daily with respect to antiplatelet therapy. He is on Lipitor, hydralazine, and metoprolol. I would not increase his metoprolol at this time, given his heart failure. A limited echocardiogram to reassess LV systolic function is pending (previously normal in 07/2013), but the technician informed me that systolic function does appear reduced.  His BP is currently stable.  I will continue the Lasix drip at 8 mg/hr with close monitoring of his renal function, which appears to be stable, albeit poor, when compared to yesterday (GFR 22 mL/min). I will also continue isosorbide dinitrate 10 mg tid.  He is not a good candidate for coronary angiography, given his multiple comorbidities (CKD, anemia, HCAP). He is at risk for contrast-induced nephropathy with a risk for becoming hemodialysis-dependent. There is also an inability to add additional antiplatelet therapy due to severe anemia.  I feel that this patient should be considered for hospice/palliative care, given the aforementioned reasons. In the meantime, I will try to control symptoms and address disease status with medical management.    Prentice Docker, M.D., F.A.C.C.

## 2013-10-21 NOTE — Procedures (Signed)
Extubation Procedure Note  Patient Details:   Name: [name] DOB: 1920/08/01 MRN: [id number]   Airway Documentation:     Evaluation  O2 sats: currently acceptable Complications: No apparent complications Patient did tolerate procedure well. Bilateral Breath Sounds: Diminished;Rhonchi Suctioning: Airway Yes  Tylene Fantasia 10/21/2013, 9:08 AM

## 2013-10-21 NOTE — Progress Notes (Addendum)
[facility] PROGRESS NOTE  Loc Feinstein YQM:[id number] DOB: 1919/12/22 DOA: 10/19/2013 PCP: Evlyn Courier, MD  Summary:  This patient was admitted to the hospital with shortness of breath.  He has a known history of CAD as well as diastolic CHF.  He has severe aortic stenosis, CKD 3 and recent GI bleeding from gastric AVMs.  He was admitted with acute CHF and NSTEMI. After admission, he quickly decompensated and required intubation. He was started on a lasix infusion and followed by pulmonology and cardiology.  His prognosis is very poor, but he was able to extubate today.  Plan of care is outlined below.  Assessment/Plan: 1. Acute respiratory failure. Felt to be secondary to congestive heart failure, possibly element of health care associated pneumonia. Patient required intubation with mechanical ventilation on 11/23. Chest xray with diuresis appears to be improving this morning.  Patient was extubated today and is now on ventimask.  He appears to be very anxious and will receive a small dose of ativan and started on bipap. 2. Acute on chronic diastolic congestive heart failure. Most recent ejection fraction was found in normal range in 07/2013. Patient presents shortness of breath, pulmonary edema and then developed pleural effusions. His dry weight on his last admission was noted to be 136 pounds. His weight from this morning is noted to be 152 pounds, although this may not be accurate. The patient was initially started on Lasix 60 mg IV every 8 hours, but did not have any significant urine output. He was transitioned to a Lasix infusion and urine output still appeared to be poor. Cardiology is following and increased lasix infusion resulting in increasing urine output. . Repeat echocardiogram has been ordered. 3. Severe aortic stenosis.  Patient has known severe aortic stenosis and is not a candidate for operative repair.  This is likely driving his heart failure and makes his prognosis very  poor. 4. Non-ST elevation MI. Most recent troponin was noted to be 10.5. He was not started on any anticoagulation or Plavix with his history of recent GI bleeding from gastric AVMs. The patient was started on aspirin since his rectal exam did not show any Hemoccult positive stools and has not had any gross evidence of GI bleeding. He is continued on beta blocker, statin and aspirin. He is not a candidate for ACE inhibitors due to chronic kidney disease. Repeat echocardiogram has been done with results pending. 5. Possible healthcare associated pneumonia. There was question of underlying infiltrate on chest x-ray, patient did report feeling feverish on admission. He is to vancomycin and cefepime for now. Blood cultures have shown no growth to date, but patient did have a fever spike overnight.  Continue with antibiotics for now. 6. Anemia, likely due to chronic disease as well as iron deficiency from chronic GI blood loss. The patient was transfused 2 units PRBCs on admission. Continue to trend hemoglobin and watch for signs of bleeding. 7. Chronic kidney disease stage III. Creatinine has been trending up with diuresis but appears to be stabilizing. His lasix infusion was increased yesterday and urine output appears to be picking up.  Continue current treatments.  8. Metabolic acidosis. Patient elevated lactate as well as a serum bicarbonate of 10. He was briefly placed on a bicarbonate infusion which has since been discontinued. May be possibly due to hypoperfusion due to low cardiac output. Continue to follow.  Code Status: DNR Family Communication: I had a long discussion with the patient's daughter over the phone.  I explained the  patient's current state, co-morbid conditions, complexity of his illnesses and poor prognosis.  I strongly recommended a DNR status, and Claris Gower does agree with this. I have also recommended to continue current measures as we are doing, but if patient starts to decline, then  care should be transitioned towards comfort. I did not recommend re-intubation. Claris Gower understands the severity of the patient's condition and agrees with this plan.  She reports that both herself and her sister Gavin Pound have been expecting this course for quite some time now and are agreeable with proceeding with this plan.  Will continue the patient on diuretics/antibiotics and bipap for now, but if he starts to decline, plan will be to transition to comfort. Disposition Plan: pending hospital course   Consultants:  Cardiology  Pulmonology  Procedures:  Intubation 11/23 - 11/25  Antibiotics:  Vancomycin 11/24  Cefepime 11/24  HPI/Subjective: Patient was extubated prior to my arrival.  He is on a ventimask and appears to be extremely anxious and is in respiratory distress.  Objective: Filed Vitals:   10/21/13 0900  BP:   Pulse: 92  Temp:   Resp: 14    Intake/Output Summary (Last 24 hours) at 10/21/13 1101 Last data filed at 10/21/13 0900  Gross per 24 hour  Intake 639.17 ml  Output   2000 ml  Net -1360.83 ml   Filed Weights   10/19/13 1104 10/20/13 0500 10/21/13 0500  Weight: 67 kg (147 lb 11.3 oz) 66.8 kg (147 lb 4.3 oz) 69 kg (152 lb 1.9 oz)    Exam:   General:  Extubated on ventimask, increased work of breathing  Cardiovascular: S1, S2 tachycardic  Respiratory: bilateral rhonchi  Abdomen: soft, nt, nd, bs+  Musculoskeletal: no significant pedal edema   Data Reviewed: Basic Metabolic Panel:  Recent Labs Lab 10/19/13 0539 10/19/13 1525 10/20/13 0408 10/21/13 0416  NA 141 134* 138 140  K 3.7 4.9 4.3 3.6  CL 110 101 103 102  CO2  --  10* 23 28  GLUCOSE 263* 260* 168* 103*  BUN 24* 29* 34* 33*  CREATININE 2.10* 2.28* 2.83* 2.76*  CALCIUM  --  9.3 9.5 9.6   Liver Function Tests:  Recent Labs Lab 10/20/13 0408  AST 45*  ALT 21  ALKPHOS 65  BILITOT 0.6  PROT 6.5  ALBUMIN 3.0*   No results found for this basename: LIPASE, AMYLASE,   in the last 168 hours No results found for this basename: AMMONIA,  in the last 168 hours CBC:  Recent Labs Lab 10/19/13 0539 10/19/13 0544 10/20/13 0408 10/21/13 0416  WBC  --  11.1* 8.9 9.5  NEUTROABS  --  9.4*  --   --   HGB 8.5* 7.2* 8.5* 8.6*  HCT 25.0* 24.4* 26.5* 27.1*  MCV  --  77.0* 78.2 78.6  PLT  --  258 207 203   Cardiac Enzymes:  Recent Labs Lab 10/19/13 1127 10/19/13 2131 10/20/13 0408  TROPONINI 2.13* 7.14* 10.54*   BNP (last 3 results)  Recent Labs  08/06/13 0321 09/10/13 2323 10/19/13 0643  PROBNP 222.8 1262.0* 1806.0*   CBG:  Recent Labs Lab 10/20/13 1607 10/20/13 1957 10/20/13 2355 10/21/13 0352 10/21/13 0751  GLUCAP 127* 105* 114* 104* 91    Recent Results (from the past 240 hour(s))  MRSA PCR SCREENING     Status: None   Collection Time    10/19/13 11:25 AM      Result Value Range Status   MRSA by PCR NEGATIVE  NEGATIVE  Final   Comment:            The GeneXpert MRSA Assay (FDA     approved for NASAL specimens     only), is one component of a     comprehensive MRSA colonization     surveillance program. It is not     intended to diagnose MRSA     infection nor to guide or     monitor treatment for     MRSA infections.  CULTURE, BLOOD (ROUTINE X 2)     Status: None   Collection Time    Nov 11, 2013  9:31 PM      Result Value Range Status   Specimen Description BLOOD RIGHT WRIST   Final   Special Requests BOTTLES DRAWN AEROBIC AND ANAEROBIC 8CC   Final   Culture NO GROWTH 2 DAYS   Final   Report Status PENDING   Incomplete  CULTURE, BLOOD (ROUTINE X 2)     Status: None   Collection Time    11-11-13  9:45 PM      Result Value Range Status   Specimen Description BLOOD RIGHT HAND   Final   Special Requests BOTTLES DRAWN AEROBIC AND ANAEROBIC 6CC   Final   Culture NO GROWTH 2 DAYS   Final   Report Status PENDING   Incomplete     Studies: Nm Pulmonary Perfusion  11-11-2013   CLINICAL DATA:  Shortness of breath. Respiratory  failure and renal failure.  EXAM: NUCLEAR MEDICINE PULMONARY PERFUSION SCAN  TECHNIQUE: Perfusion images were obtained in multiple projections after intravenous injection of radiopharmaceutical.  COMPARISON:  CHEST RADIOGRAPH ON November 11, 2013  RADIOPHARMACEUTICALS:  6 mCi Tc23m MAA  FINDINGS: A nonsegmental perfusion defect is seen in the left lung corresponding to pleural fluid within the major fissure seen radiographically. No segmental pulmonary perfusion defects identified in either lung.  IMPRESSION: No segmental pulmonary perfusion defects identified.   Electronically Signed   By: Myles Rosenthal M.D.   On: Nov 11, 2013 21:44   Dg Chest Port 1 View  10/21/2013   CLINICAL DATA:  Respiratory failure  EXAM: PORTABLE CHEST - 1 VIEW  COMPARISON:  10/20/2013  FINDINGS: There is elevation of the right hemidiaphragm. Prominence of interstitial markings appreciated. Increased density projects in the right left lung bases there is blunting of the costophrenic angles. The cardiac silhouette is enlarged. Endotracheal tube is appreciated with tip approximately 4.5 cm above the carina. Calcifications identified within the cord to the aorta. The visualized osseous structures unremarkable.  IMPRESSION: Interstitial infiltrate which is decreased in conspicuity when compared to the previous study. The likely reflecting decreased pulmonary edema. There is blunting of the costophrenic angles may represent small effusions. The densities within the lung bases of also decreased in conspicuity again likely right reflecting improved edema. Continued surveillance evaluation recommended. Sign report   Electronically Signed   By: Salome Holmes M.D.   On: 10/21/2013 08:43   Portable Chest Xray In Am  10/20/2013   CLINICAL DATA:  History shortness of breath.  EXAM: PORTABLE CHEST - 1 VIEW  COMPARISON:  11-11-2013  FINDINGS: Endotracheal tube is 3.8 cm above the carina. There are increased basilar densities suggestive for pleural fluid and  probable pulmonary edema. The heart is obscured by the basilar chest densities. Nasogastric tube extends into the abdomen.  IMPRESSION: Increased pleural and parenchymal densities are suggestive for pulmonary edema and pleural effusions.  Support apparatuses as described.   Electronically Signed   By: Richarda Overlie  M.D.   On: 10/20/2013 07:52   Portable Chest Xray  10/19/2013   CLINICAL DATA:  Endotracheal tube placement.  EXAM: PORTABLE CHEST - 1 VIEW  COMPARISON:  Same day.  FINDINGS: Stable cardiomegaly. Interval placement of nasogastric tubes seen in proximal stomach. Interval placement of endotracheal tube which is in grossly good position with distal tip 3 cm above the carinal. Bilateral pulmonary edema is noted with associated pleural effusions. No pneumothorax is noted.  IMPRESSION: Continued presence of cardiomegaly and bilateral pulmonary edema consistent with congestive heart failure with associated pleural effusions. Endotracheal and nasogastric tubes are in grossly good position.   Electronically Signed   By: Roque Lias M.D.   On: 10/19/2013 12:49    Scheduled Meds: . albuterol  2.5 mg Nebulization Q6H  . allopurinol  100 mg Oral Daily  . antiseptic oral rinse  15 mL Mouth Rinse QID  . aspirin  325 mg Oral Daily  . atorvastatin  40 mg Oral q1800  . ceFEPime (MAXIPIME) IV  1 g Intravenous Q24H  . chlorhexidine  15 mL Mouth Rinse BID  . fentaNYL  50 mcg Intravenous Once  . heparin  5,000 Units Subcutaneous Q8H  . hydrALAZINE  50 mg Oral TID  . insulin aspart  0-15 Units Subcutaneous Q4H  . ipratropium  0.5 mg Nebulization Q6H  . isosorbide dinitrate  10 mg Oral TID  . LORazepam      . LORazepam  0.5 mg Intravenous Once  . metoprolol  50 mg Oral BID  . pantoprazole (PROTONIX) IV  40 mg Intravenous Q12H  . sodium chloride  3 mL Intravenous Q12H  . vancomycin  1,000 mg Intravenous Q48H   Continuous Infusions: . fentaNYL infusion INTRAVENOUS Stopped (10/21/13 0800)  . furosemide  (LASIX) infusion 8 mg/hr (10/21/13 1006)    Active Problems:   MI, acute, non ST segment elevation   Aortic stenosis   CKD (chronic kidney disease) stage 3, GFR 30-59 ml/min   DM (diabetes mellitus) type II uncontrolled with renal manifestation   Acute on chronic diastolic CHF (congestive heart failure), NYHA class 4   Acute respiratory failure   Anemia of chronic disease   HCAP (healthcare-associated pneumonia)    Time spent: critical care:    MEMON,JEHANZEB  [facility] Pager [phone]. If 7PM-7AM, please contact night-coverage at [email or web address], password [facility] 10/21/2013, 11:01 AM  LOS: 2 days

## 2013-10-22 DIAGNOSIS — N058 Unspecified nephritic syndrome with other morphologic changes: Secondary | ICD-10-CM

## 2013-10-22 DIAGNOSIS — I219 Acute myocardial infarction, unspecified: Secondary | ICD-10-CM

## 2013-10-22 DIAGNOSIS — E1129 Type 2 diabetes mellitus with other diabetic kidney complication: Secondary | ICD-10-CM

## 2013-10-22 LAB — BASIC METABOLIC PANEL
BUN: 37 mg/dL — ABNORMAL HIGH (ref 6–23)
CO2: 25 mEq/L (ref 19–32)
Calcium: 9.8 mg/dL (ref 8.4–10.5)
Chloride: 98 mEq/L (ref 96–112)
GFR calc non Af Amer: 23 mL/min — ABNORMAL LOW (ref >90)
Glucose, Bld: 117 mg/dL — ABNORMAL HIGH (ref 70–99)
Sodium: 140 mEq/L (ref 135–145)

## 2013-10-22 LAB — GLUCOSE, CAPILLARY: Glucose-Capillary: 122 mg/dL — ABNORMAL HIGH (ref 70–99)

## 2013-10-22 MED ORDER — LORAZEPAM 2 MG/ML IJ SOLN
1.0000 mg | Freq: Once | INTRAMUSCULAR | Status: AC
  Administered 2013-10-22: 1 mg via INTRAVENOUS

## 2013-10-22 MED ORDER — LORAZEPAM 2 MG/ML IJ SOLN
INTRAMUSCULAR | Status: AC
  Filled 2013-10-22: qty 1

## 2013-10-22 MED ORDER — POTASSIUM CHLORIDE 10 MEQ/100ML IV SOLN
10.0000 meq | INTRAVENOUS | Status: AC
  Administered 2013-10-22 (×3): 10 meq via INTRAVENOUS
  Filled 2013-10-22 (×4): qty 100

## 2013-10-22 MED ORDER — LORAZEPAM 2 MG/ML IJ SOLN
0.5000 mg | INTRAMUSCULAR | Status: DC | PRN
  Administered 2013-10-22 – 2013-10-23 (×4): 0.5 mg via INTRAVENOUS
  Filled 2013-10-22 (×4): qty 1

## 2013-10-22 NOTE — Progress Notes (Signed)
The evaluation from cardiology and the information from attending hospitalist are noted and I will plan to sign off at this time. Thanks for allow me to see him with you

## 2013-10-22 NOTE — Plan of Care (Signed)
Problem: Consults Goal: Palliative Care Patient Education Description: See Patient Education module for education specifics. Outcome: Progressing Comfort Care in progress, family at bedside, patient has refused all care, MD aware.  Family in agreement with patient wishes.  DNR status.  Problem: Phase I Progression Outcomes Goal: Pain controlled with appropriate interventions Outcome: Progressing Morphine prn for moaning and pain.  When asked patient stated he was in pain. Goal: Oral assessment and care per protocol Outcome: Progressing mouthcare given using biotene swabs Goal: Psychosocial & spiritual needs assessed Outcome: Not Progressing CD player in room with spiritual music playing.  Earlier family sang at bedside.  Tray of refreshments for family given.

## 2013-10-22 NOTE — Progress Notes (Signed)
Pt Spo2 dropped down to 70-80's pt back on BIPAP

## 2013-10-22 NOTE — Progress Notes (Signed)
NUTRITION FOLLOW UP  Intervention:   RD will follow for diet advancement and nutrition interventions based on goals of care  Nutrition Dx:   1) Malnutrition; ongoing 2) Inadequate oral intake r/t inability to eat due to respiratory failure AEB NPO status, on Bi-Pap; ongoing   Goal:   Pt to meet nutrition needs as able  Monitor:   DIet advancement, labs, weight changes, IO's, skin assessments, changes in status/ goals of care  Assessment:   Pt was extubated on 10/21/13 and was transitioned to BiPap. Per MD notes, pt's prognosis continues to be very poor. Discussion with family based on goals of care has been initiated; pending input from both daughter's on pt condition. Plan is to continue diruetics, antibiotics, and bipap, however, pt will likely transition to comfort care if condition worsens.  Wt hx revals a 4# (2.7%) wt loss likely due to diuresis. Remains NPO.   Height: Ht Readings from Last 1 Encounters:  10/20/13 5\' 5"  (1.651 m)    Weight Status:   Wt Readings from Last 1 Encounters:  10/22/13 143 lb 1.6 oz (64.91 kg)    Re-estimated needs:  Kcal: 2000-2200 kcals daily Protein: 65-85 grams daily Fluid:  2.0-2.2 L fluid daily  Skin: WDL  Diet Order: NPO   Intake/Output Summary (Last 24 hours) at 10/22/13 1234 Last data filed at 10/22/13 0800  Gross per 24 hour  Intake    234 ml  Output   2200 ml  Net  -1966 ml    Last BM:  PTA   Labs:   Recent Labs Lab 10/20/13 0408 10/21/13 0416 10/22/13 0438  NA 138 140 140  K 4.3 3.6 3.1*  CL 103 102 98  CO2 23 28 25   BUN 34* 33* 37*  CREATININE 2.83* 2.76* 2.36*  CALCIUM 9.5 9.6 9.8  GLUCOSE 168* 103* 117*    CBG (last 3)   Recent Labs  10/22/13 0334 10/22/13 0725 10/22/13 1138  GLUCAP 122* 117* 122*    Scheduled Meds: . albuterol  2.5 mg Nebulization Q6H  . allopurinol  100 mg Oral Daily  . aspirin  325 mg Oral Daily  . atorvastatin  40 mg Oral q1800  . ceFEPime (MAXIPIME) IV  1 g  Intravenous Q24H  . heparin  5,000 Units Subcutaneous Q8H  . hydrALAZINE  50 mg Oral TID  . insulin aspart  0-15 Units Subcutaneous Q4H  . ipratropium  0.5 mg Nebulization Q6H  . isosorbide dinitrate  10 mg Oral TID  . metoprolol  50 mg Oral BID  . pantoprazole (PROTONIX) IV  40 mg Intravenous Q12H  . potassium chloride  10 mEq Intravenous Q1 Hr x 4  . sodium chloride  3 mL Intravenous Q12H  . vancomycin  1,000 mg Intravenous Q48H    Continuous Infusions: . furosemide (LASIX) infusion 8 mg/hr (10/22/13 0800)    Aalayah Riles A. Mayford Knife, RD, LDN Pager: [phone]

## 2013-10-22 NOTE — Plan of Care (Signed)
Problem: Phase II Progression Outcomes Goal: Date pt extubated/weaned off vent Outcome: Completed/Met Date Met:  10/22/13 Extubated 10/21/13 Goal: Progress activities as ordered Outcome: Not Progressing Patient remains on bedrest as condition is worsening.  Patient is resigned to poor respiratory status.  Bipap used for a while, then NRB, finally patient refused all oxygen.  Remains pleasant in room with family at bedside Goal: Pain controlled Outcome: Progressing With morphine prn Goal: Discharge/transfer plan updated Outcome: Progressing Transfer to medsurg floor in am.

## 2013-10-22 NOTE — Progress Notes (Signed)
ANTIBIOTIC CONSULT NOTE   Pharmacy Consult for Vancomycin and Cefepime Indication: pneumonia  No Known Allergies  Patient Measurements: Height: 5\' 5"  (165.1 cm) Weight: 143 lb 1.6 oz (64.91 kg) IBW/kg (Calculated) : 61.5  Vital Signs: Temp: 99.4 F (37.4 C) (11/26 0400) Temp src: Axillary (11/26 0400) BP: 139/49 mmHg (11/26 0800) Pulse Rate: 88 (11/26 0800) Intake/Output from previous day: 11/25 0701 - 11/26 0700 In: 372 [P.O.:25; I.V.:97; IV Piggyback:250] Out: 2800 [Urine:2800] Intake/Output from this shift: Total I/O In: 144 [I.V.:144] Out: -   Labs:  Recent Labs  10/20/13 0408 10/21/13 0416 10/22/13 0438  WBC 8.9 9.5  --   HGB 8.5* 8.6*  --   PLT 207 203  --   CREATININE 2.83* 2.76* 2.36*   Estimated Creatinine Clearance: 17.7 ml/min (by C-G formula based on Cr of 2.36). No results found for this basename: VANCOTROUGH, Leodis Binet, VANCORANDOM, GENTTROUGH, GENTPEAK, GENTRANDOM, TOBRATROUGH, TOBRAPEAK, TOBRARND, AMIKACINPEAK, AMIKACINTROU, AMIKACIN,  in the last 72 hours   Microbiology: Recent Results (from the past 720 hour(s))  MRSA PCR SCREENING     Status: None   Collection Time    10/19/13 11:25 AM      Result Value Range Status   MRSA by PCR NEGATIVE  NEGATIVE Final   Comment:            The GeneXpert MRSA Assay (FDA     approved for NASAL specimens     only), is one component of a     comprehensive MRSA colonization     surveillance program. It is not     intended to diagnose MRSA     infection nor to guide or     monitor treatment for     MRSA infections.  CULTURE, BLOOD (ROUTINE X 2)     Status: None   Collection Time    10/19/13  9:31 PM      Result Value Range Status   Specimen Description BLOOD RIGHT WRIST   Final   Special Requests BOTTLES DRAWN AEROBIC AND ANAEROBIC 8CC   Final   Culture NO GROWTH 3 DAYS   Final   Report Status PENDING   Incomplete  CULTURE, BLOOD (ROUTINE X 2)     Status: None   Collection Time    10/19/13  9:45 PM       Result Value Range Status   Specimen Description BLOOD RIGHT HAND   Final   Special Requests BOTTLES DRAWN AEROBIC AND ANAEROBIC 6CC   Final   Culture NO GROWTH 3 DAYS   Final   Report Status PENDING   Incomplete   Medical History: Past Medical History  Diagnosis Date  . Diabetes mellitus   . Hypertension   . Arthritis   . Prostate cancer   . Gout   . Coronary artery disease     a. presumed, s/p NSTEMI 07/2013-->Med Rx  . GERD (gastroesophageal reflux disease)   . CKD (chronic kidney disease), stage III   . Iron deficiency anemia     a. 07/2013 s/p multiple PRBC's.  . Heme positive stool   . Moderate to severe aortic stenosis     a.  07/2013 Echo: EF50-55%, no rwma, mod-sev AS (0.78cm^2 VTI, 0.73cm^2 Vmax), mild AI, Mild MR, mod dil LA, PASP  . Rheumatic fever    Medications:  Prescriptions prior to admission  Medication Sig Dispense Refill  . allopurinol (ZYLOPRIM) 100 MG tablet Take 100 mg by mouth daily.        Marland Kitchen  amLODipine (NORVASC) 10 MG tablet Take 1 tablet (10 mg total) by mouth daily.  30 tablet  11  . atorvastatin (LIPITOR) 40 MG tablet Take 1 tablet (40 mg total) by mouth daily at 6 PM.  30 tablet  1  . furosemide (LASIX) 40 MG tablet Take 1 tablet (40 mg total) by mouth 2 (two) times daily.  60 tablet  3  . hydrALAZINE (APRESOLINE) 50 MG tablet Take 1 tablet (50 mg total) by mouth 3 (three) times daily.  90 tablet  1  . insulin glargine (LANTUS) 100 UNIT/ML injection Inject 24-28 Units into the skin daily. 24 units if blood sugar is below 150 and 28 units if blood sugar is above 150.      . metoprolol (LOPRESSOR) 50 MG tablet Take 1 tablet (50 mg total) by mouth 2 (two) times daily.  60 tablet  1  . pantoprazole (PROTONIX) 40 MG tablet Take 40 mg by mouth 2 (two) times daily.      . potassium chloride SA (K-DUR,KLOR-CON) 20 MEQ tablet Take 1 tablet (20 mEq total) by mouth daily.  30 tablet  11   Assessment: Okay for Protocol, received Levaquin 750mg  IV x 1  in ED.  Possible HCAP. Patient reports feeling feverish overnight, there is a mild leukocytosis and chest x-ray indicates a possible underlying infiltrate. He was recently in the hospital.  Levaquin 750mg  X 1 11/23 Cefepime 11/23 >> Vancomycin 11/23 >>  Goal of Therapy:  Vancomycin trough level 15-20 mcg/ml  Plan:  Vancomycyin 1000mg  IV every 48 hours. Cefepime 1000mg  IV every 24 hours. Measure antibiotic drug levels at steady state Follow up culture results  Valrie Hart A 10/22/2013,11:35 AM

## 2013-10-22 NOTE — Progress Notes (Signed)
Pt BIPAP alarms been going off due to leak.. I have tried with different mask and adjusted the mask several times during the night alarm still alarming. Pt family asked if pt can be place back on the v. Mask and I changed back over. Pt in no distress Spo2 100%.

## 2013-10-22 NOTE — Progress Notes (Signed)
Called to see patient by RN.  Daughter Gavin Pound tells me "he wants everything stopped. We are going to abide by his wishes. I have talked with my sister Claris Gower and she agrees".  Patient tells me "Doc, let me go. Please no more treatment."  The patient is his wishes very clear and family agrees. Discussed with cardiology, we will proceed with full comfort care.  Brendia Sacks, MD [facility] [phone]

## 2013-10-22 NOTE — Progress Notes (Signed)
Mouthcare completed and patient  turned with mild back rub.  Family at bedside.  CD player for spiritual music.  Family singing to patient.  Morphine 1mg  iv given for patient moaning and when asked if in pain he stated yes.  Tray requested from [facility] for family. Comfort care continues.

## 2013-10-22 NOTE — Progress Notes (Signed)
[facility] PROGRESS NOTE  [name] HQI:[id number] DOB: 09/03/1920 DOA: 10/19/2013 PCP: Evlyn Courier, MD Cardiologist: Dr. Purvis Sheffield  Assessment/Plan: 1. Acute hypoxic respiratory failure: Remains BiPAP dependent nearly 24 hours status post extubation. Secondary to acute heart failure (combined systolic and diastolic), complicated by severe aortic stenosis, possible healthcare associated pneumonia. Required intubation with mechanical ventilation 11/23-11/25. 2. Acute on chronic diastolic congestive heart failure: Chest x-ray yesterday showed improvement but clinically remains tenuous. Presented with shortness of breath and pulmonary edema, treated with IV Lasix infusion and seen in consultation with cardiology. 3. Acute systolic heart failure: Mildly depressed ejection fraction as needed. Management with diuretics as above. Not a candidate for ACE inhibitor secondary to renal dysfunction. Continue beta blocker. 4. Severe aortic stenosis: Not a candidate for operative repair. Contributing to heart failure, prognosis poor. Continue beta blocker. 5. Non-ST elevation MI: Treated medically. No intervention per cardiology. Medical management has been limited secondary to history GI bleed with gastric AVMs. Treat with aspirin, statin, beta blocker. Not a candidate for ACE inhibitors secondary to chronic kidney disease. 6. Possible healthcare associated pneumonia: Continue empiric antibiotics. 7. Anemia: Likely chronic disease as well as iron deficiency from chronic GI blood loss. Transfused 2 units packed red blood cells this admission.  8. Acute renal failure superimposed on Chronic kidney disease stage III: Somewhat better today. 9. Metabolic acidosis: As manifested by Elevated lactate and low serum bicarbonate. Possibly secondary to hypoperfusion due to low cardiac output. Appears resolved at this point. 10. Diabetes mellitus: Capillary blood sugars stable. Continue sliding scale  insulin. 11. Severe malnutrition in context of chronic illness  Discussed with daughter [name] at bedside. We discussed guarded prognosis which is poor, critical condition and BiPAP dependence. For now we will continue current therapy pending the arrival of her sister [name]. There is no formal power of attorney, [name] and [name] are medical decision makers. In my discussion with Stanton Kidney, she confirms the plan of current therapy with no escalation. If worsens. With comfort care. This was discussed in the presence of Neurosurgeon.   Continue BiPAP  Continue aspirin, Lipitor, hydralazine, metoprolol, isosorbide dinitrate without change. Continue Lasix infusion.  Continue empiric antibiotics  Remain n.p.o.  Pending studies:   Blood cultures  Code Status: DNR, DNI DVT prophylaxis: heparin Family Communication: as above Disposition Plan: pending  Brendia Sacks, MD  [facility]  Pager [phone] If 7PM-7AM, please contact night-coverage at [email or web address], password [facility] 10/22/2013, 8:26 AM  LOS: 3 days   Summary: 79 year old man presented with increasing shortness of breath, required CPAP in the emergency department. He briefly improved with Lasix, however again deteriorated and required intubation shortly after admission. Respiratory failure felt to be secondary to acute diastolic, systolic heart failure, non-ST elevation MI, possibly pneumonia. History complicated by history GI bleed with AVM prior to admission, severe aortic stenosis and advanced kidney disease.  After admission, he quickly decompensated and required intubation. He was started on a lasix infusion and followed by pulmonology and cardiology. His prognosis is very poor, but he was able to extubate.   Consultants:  Cardiology  Pulmonology Procedures:  Intubation 11/23 - 11/25 Transfusion 2 units packed red blood cells   2-D echocardiogram revealed LVEF 45-50%. Grade 3 diastolic dysfunction. Regional wall  motion abnormalities, new. Severe aortic stenosis. Antibiotics:  Vancomycin 11/24  Cefepime 11/24  HPI/Subjective:  Dr. Kerry Hough discussed with family yesterday. Plans were no reintubation, if worsened then comfort care. Extubated yesterday 11/25. 2 hours after extubation patient was placed on BiPAP  for respiratory distress. Cardiology recommended medical management and suggested hospice/palliative care. 2-D echocardiogram revealed LVEF 45-50%. Grade 3 diastolic dysfunction. Regional wall motion abnormalities, new. Severe aortic stenosis.He is now 100% FiO2 on BiPAP.   Objective: Filed Vitals:   10/22/13 0300 10/22/13 0400 10/22/13 0402 10/22/13 0724  BP: 119/46 143/52    Pulse: 79 88    Temp:  99.4 F (37.4 C)    TempSrc:  Axillary    Resp: 19 22    Height:      Weight:   64.91 kg (143 lb 1.6 oz)   SpO2: 98% 90%  98%    Intake/Output Summary (Last 24 hours) at 10/22/13 0826 Last data filed at 10/22/13 0500  Gross per 24 hour  Intake    326 ml  Output   2800 ml  Net  -2474 ml     Filed Weights   10/20/13 0500 10/21/13 0500 10/22/13 0402  Weight: 66.8 kg (147 lb 4.3 oz) 69 kg (152 lb 1.9 oz) 64.91 kg (143 lb 1.6 oz)    Exam:   On BiPAP  General: Appears critically ill. Does not follow commands. Overall appears calm.  Eyes: Pupils round, equal. Normal lids, irises.  ENT: Grossly unremarkable, exam limited whileon BiPAP.  Neck: No lymphadenopathy or masses. No thyromegaly.  Cardiovascular: Regular rate and rhythm. 3/6 systolic murmur. No rub or gallop. No lower extremity edema.  Respiratory: Decreased breath sounds bilaterally. Generally clear without frank wheezes, rales or rhonchi. Moderate increased respiratory effort.  Abdomen: Soft, nontender, nondistended.  Skin: No rash or induration seen.  Musculoskeletal: Feet warm and dry. Does not follow commands.  Psychiatric: Cannot assess.  Neurologic: Cannot assess.  Data Reviewed:  Weight has varied, accuracy  is unclear. Urine output 2800. -2.6 L overall.  Capillary blood sugars stable  Potassium 3.1.  Creatinine improved, decreased, 2.36.    hemoglobin stable 8.6  V/Q scan was negative  Chest x-ray 11/25 suggested decreased pulmonary edema.  Echocardiogram noted  Scheduled Meds: . albuterol  2.5 mg Nebulization Q6H  . allopurinol  100 mg Oral Daily  . aspirin  325 mg Oral Daily  . atorvastatin  40 mg Oral q1800  . ceFEPime (MAXIPIME) IV  1 g Intravenous Q24H  . heparin  5,000 Units Subcutaneous Q8H  . hydrALAZINE  50 mg Oral TID  . insulin aspart  0-15 Units Subcutaneous Q4H  . ipratropium  0.5 mg Nebulization Q6H  . isosorbide dinitrate  10 mg Oral TID  . metoprolol  50 mg Oral BID  . pantoprazole (PROTONIX) IV  40 mg Intravenous Q12H  . sodium chloride  3 mL Intravenous Q12H  . vancomycin  1,000 mg Intravenous Q48H   Continuous Infusions: . furosemide (LASIX) infusion 8 mg/hr (10/21/13 1400)    Active Problems:   MI, acute, non ST segment elevation   Aortic stenosis   CKD (chronic kidney disease) stage 3, GFR 30-59 ml/min   DM (diabetes mellitus) type II uncontrolled with renal manifestation   Acute on chronic diastolic CHF (congestive heart failure), NYHA class 4   Acute respiratory failure   Anemia of chronic disease   HCAP (healthcare-associated pneumonia)   Time spent 45 minutes, Greater than 50% in counseling and coordination of care

## 2013-10-22 NOTE — Progress Notes (Signed)
Patient's daughter asked me to come into the room, the patient wanted his oxygen masked removed to take a picture. I removed the oxygen mask and patient took multiple pictures with his family. He then told the family to "take another picture of me when I die." The patient's oxygen sats stayed above 90 on RA. He asked for a sip of water and was given some water. Patient then told me he did not want his oxygen put back on. He did not want the wires on him anymore. He asked me to remove BP cuff, tele leads and o2 sats. The daughter was visibly upset but accepting of the patient's wishes. Patient was repositioned himself and is resting comfortably now. Dr. Irene Limbo was notified and we will initiate comfort care.

## 2013-10-23 NOTE — Progress Notes (Signed)
[facility] PROGRESS NOTE  Airik Goodlin ZOX:[id number] DOB: Dec 20, 1919 DOA: 10/19/2013 PCP: Evlyn Courier, MD Cardiologist: Dr. Purvis Sheffield  Assessment/Plan: 1. Acute hypoxic respiratory failure: Secondary to acute heart failure (combined systolic and diastolic), complicated by severe aortic stenosis, possible healthcare associated pneumonia. Required intubation with mechanical ventilation 11/23-11/25. 2. Acute on chronic diastolic congestive heart failure 3. Acute systolic heart failure: Mildly depressed ejection fraction as needed.  4. Severe aortic stenosis: Not a candidate for operative repair 5. Non-ST elevation MI: Treated medically. No intervention per cardiology. Medical management has been limited secondary to history GI bleed with gastric AVMs.  6. Possible healthcare associated pneumonia 7. Anemia: Likely chronic disease as well as iron deficiency from chronic GI blood loss. Transfused 2 units packed red blood cells this admission.  8. Acute renal failure superimposed on Chronic kidney disease stage III 9. Diabetes mellitus 10. Severe malnutrition in context of chronic illness  Discussed with daughter Gavin Pound at bedside and again this morning. Continue comfort care. We discussed considering hospice or skilled nursing with palliative care to place depending on status.   Continue comfort care  Transfer to medical floor  Consults social work 11/28, consider hospice versus skilled nursing facility  Pending studies:   Blood cultures  Code Status: DNR, DNI; full comfort care DVT prophylaxis: none Family Communication: as above Disposition Plan: pending  Brendia Sacks, MD  [facility]  Pager [phone] If 7PM-7AM, please contact night-coverage at [email or web address], password [facility] 10/23/2013, 8:47 AM  LOS: 4 days   Summary: 79 year old man presented with increasing shortness of breath, required CPAP in the emergency department. He briefly improved with Lasix,  however again deteriorated and required intubation shortly after admission. Respiratory failure felt to be secondary to acute diastolic, systolic heart failure, non-ST elevation MI, possibly pneumonia. History complicated by history GI bleed with AVM prior to admission, severe aortic stenosis and advanced kidney disease.  After admission, he quickly decompensated and required intubation. He was started on a lasix infusion and followed by pulmonology and cardiology. His prognosis is very poor, but he was able to extubate.   Consultants:  Cardiology  Pulmonology Procedures:  Intubation 11/23 - 11/25 Transfusion 2 units packed red blood cells   2-D echocardiogram revealed LVEF 45-50%. Grade 3 diastolic dysfunction. Regional wall motion abnormalities, new. Severe aortic stenosis. Antibiotics:  Vancomycin 11/24  Cefepime 11/24  HPI/Subjective: Overall comfortable, no new issues per RN. Daughter Gavin Pound at bedside, reports patient resting comfortably.  Objective: Filed Vitals:   10/22/13 2135 10/23/13 0148 10/23/13 0315 10/23/13 0645  BP: 136/66  126/66 144/66  Pulse: 108  108   Temp: 98.8 F (37.1 C)   98.3 F (36.8 C)  TempSrc: Oral   Oral  Resp: 22 32    Height:      Weight:      SpO2: 77%  91% 72%    Intake/Output Summary (Last 24 hours) at 10/23/13 0847 Last data filed at 10/23/13 0725  Gross per 24 hour  Intake      0 ml  Output   1550 ml  Net  -1550 ml     Filed Weights   10/20/13 0500 10/21/13 0500 10/22/13 0402  Weight: 66.8 kg (147 lb 4.3 oz) 69 kg (152 lb 1.9 oz) 64.91 kg (143 lb 1.6 oz)    Exam:   General: Appears calm, comfortable.  Cardiovascular: Regular rate and rhythm. No murmur, rub or gallop.  Respiratory: Clear to auscultation bilaterally. No wheezes, rales or rhonchi.  Data Reviewed:  No new data  Scheduled Meds: . allopurinol  100 mg Oral Daily  . hydrALAZINE  50 mg Oral TID  . isosorbide dinitrate  10 mg Oral TID  . metoprolol  50 mg  Oral BID  . sodium chloride  3 mL Intravenous Q12H   Continuous Infusions:    Active Problems:   MI, acute, non ST segment elevation   Aortic stenosis   CKD (chronic kidney disease) stage 3, GFR 30-59 ml/min   DM (diabetes mellitus) type II uncontrolled with renal manifestation   Acute on chronic diastolic CHF (congestive heart failure), NYHA class 4   Acute respiratory failure   Anemia of chronic disease   HCAP (healthcare-associated pneumonia)   Time spent 15 minutes

## 2013-10-23 NOTE — Progress Notes (Signed)
Remains resting with family at bedside.  Tachypnea RR 32.

## 2013-10-24 LAB — CULTURE, BLOOD (ROUTINE X 2)
Culture: NO GROWTH
Culture: NO GROWTH

## 2013-10-24 MED ORDER — MORPHINE SULFATE (CONCENTRATE) 10 MG /0.5 ML PO SOLN: 2.5000 mg | ORAL | Status: DC | PRN

## 2013-10-24 MED ORDER — LORAZEPAM 0.5 MG PO TABS: 0.5000 mg | ORAL_TABLET | ORAL | Status: AC | PRN

## 2013-10-24 MED ORDER — ISOSORBIDE DINITRATE 10 MG PO TABS: 10.0000 mg | ORAL_TABLET | Freq: Three times a day (TID) | ORAL | Status: AC

## 2013-10-24 MED ORDER — LORAZEPAM 0.5 MG PO TABS
0.5000 mg | ORAL_TABLET | ORAL | Status: DC | PRN
  Administered 2013-10-24 – 2013-10-27 (×3): 0.5 mg via SUBLINGUAL
  Filled 2013-10-24 (×3): qty 1

## 2013-10-24 MED ORDER — MORPHINE SULFATE (CONCENTRATE) 10 MG /0.5 ML PO SOLN
2.0000 mg | ORAL | Status: DC | PRN
  Administered 2013-10-24 – 2013-10-28 (×13): 2 mg via ORAL
  Filled 2013-10-24 (×13): qty 0.5

## 2013-10-24 MED ORDER — MORPHINE SULFATE 10 MG/5ML PO SOLN: 2.5000 mg | ORAL | Status: DC | PRN

## 2013-10-24 MED ORDER — MORPHINE SULFATE (CONCENTRATE) 10 MG /0.5 ML PO SOLN
2.5000 mg | ORAL | Status: DC | PRN
  Administered 2013-10-24 (×4): 2.6 mg via ORAL
  Filled 2013-10-24 (×4): qty 0.5

## 2013-10-24 NOTE — Discharge Planning (Signed)
While attempting to confirm pt had necessary equip to travel to [place], I was told the family had plans to use [facility]. I called [facility] to confirm they'd be able to transport pt to [place] with oxygen.  They stated it was a liability issue and they normally would not transport a pt that far with O2, since it would require the storage of more than 1 tank.  Family was informed of this fact and then became extremely frustrated, since they already given [employer] and was not told they'd have trouble carrying the oxygen.  Family stated they would call us back after they'd spoken with [employer].

## 2013-10-24 NOTE — Progress Notes (Signed)
Pt pulled IV out; site is clean, dry and intact. IV catheter was intact. Pt is currently resting pleasantly. Pt rested comfortably throughout night. Paged Dr. Orvan Falconer on if we can leave IV out due to pt being on comfort care measures and not needed anything through IV throughout night. Waiting to hear back from MD. Will continue to monitor.

## 2013-10-24 NOTE — Clinical Social Work Note (Signed)
Per conversation w family and patient, patient plans to discharge to daughter's home in [place], does not want [facility] placement because no family lives nearby.  Family arranging transport needed, CSW offered assistance as needed by family.  [facility] notified.  Santa Genera, LCSW Clinical Social Worker [phone])

## 2013-10-24 NOTE — Discharge Summary (Addendum)
Physician Discharge Summary  [name] OZH:[id number] DOB: 11-03-1920 DOA: 10/19/2013  PCP: Evlyn Courier, MD  Admit date: 10/19/2013 Discharge date: 10/28/2013  Recommendations for Outpatient Follow-up:  1. Home hospice 2. oxygen at 2 L per minute nasal cannula continuous for comfort 3. Patient hospice eligible with diagnoses of hypoxic respiratory failure, chronic diastolic congestive heart failure, systolic heart failure, severe aortic stenosis, non-ST elevation MI, prognosis poor likely days to weeks  Discharge Diagnoses:  1. Acute hypoxic respiratory failure 2. Acute on chronic diastolic congestive heart failure 3. Acute systolic heart failure 4. Severe aortic stenosis 5. Non-ST elevation MI 6. Possible healthcare associated pneumonia 7. Anemia, likely chronic disease anemia 8. Acute renal failure superimposed on chronic kidney disease stage III 9. Diabetes mellitus 10. Severe malnutrition in context of chronic illness  Discharge Condition: Prognosis is poor but condition is currently stable although significantly hypoxic Disposition: Released into the care of his daughter for comfort care at home  Diet recommendation: As desired  Filed Weights   10/20/13 0500 10/21/13 0500 10/22/13 0402  Weight: 66.8 kg (147 lb 4.3 oz) 69 kg (152 lb 1.9 oz) 64.91 kg (143 lb 1.6 oz)    History of present illness:  79 year old man presented with increasing shortness of breath, required CPAP in the emergency department.  Hospital Course:  He briefly improved with Lasix, however again deteriorated and required intubation shortly after admission. Respiratory failure felt to be secondary to acute diastolic, systolic heart failure, non-ST elevation MI, possibly pneumonia. History complicated by history of GI bleed with AVM (none this admission), severe aortic stenosis and advanced kidney disease with acute renal failure.  He was treated with empiric antibiotic therapy, Lasix and seen in  consultation with cardiology and pulmonology. He was transfused for anemia as well. He was successfully extubated but then required BiPAP almost immediately afterward. He and family requested no reintubation. His condition appeared grave and after discussion with daughters and patient himself, was made full comfort care by his adamant desire. Family supports him in this. He desires no further testing. Further medical treatment should be for to comfort only. His condition gradually stabilized. Discussed options including home with hospice, residential hospice, home with home health, skilled nursing facility with palliative care. Patient declines all these options, he plans to go home with his daughter Claris Gower for end-of-life care. All family members agreed that this is a good plan and his needs can be provided for. The patient desires comfort care only, no further medical intervention and this seems reasonable. Discharge was delayed by family desire. See progress notes for further details.  1. Acute hypoxic respiratory failure: Secondary to acute heart failure (combined systolic and diastolic), complicated by severe aortic stenosis, possible healthcare associated pneumonia. Required intubation with mechanical ventilation 11/23-11/25. 2. Acute on chronic diastolic congestive heart failure 3. Acute systolic heart failure: Mildly depressed ejection fraction 4. Severe aortic stenosis: Not a candidate for operative repair per cardiology 5. Non-ST elevation MI: Treated medically. No intervention per cardiology. Medical management has been limited secondary to history GI bleed with gastric AVMs.  6. Possible healthcare associated pneumonia 7. Anemia: Likely chronic disease as well as iron deficiency from chronic GI blood loss. Transfused 2 units packed red blood cells this admission.  8. Acute renal failure superimposed on Chronic kidney disease stage III 9. Diabetes mellitus 10. Severe malnutrition in context  of chronic illness  Consultants:  Cardiology  Pulmonology Procedures:  Intubation 11/23 - 11/25  Transfusion 2 units packed red blood cells  2-D echocardiogram revealed LVEF 45-50%. Grade 3 diastolic dysfunction. Regional wall motion abnormalities, new. Severe aortic stenosis.  Discharge Instructions  Discharge Orders   Future Appointments Provider Department Dept Phone   01/07/2014 10:45 AM Santiago Bumpers, DPM [facility] [phone]   Future Orders Complete By Expires   Diet general  As directed    Discharge instructions  As directed    Comments:     Diet as desired. Medications recommended for comfort care only.   Increase activity slowly  As directed        Medication List    STOP taking these medications       amLODipine 10 MG tablet  Commonly known as:  NORVASC     atorvastatin 40 MG tablet  Commonly known as:  LIPITOR     furosemide 40 MG tablet  Commonly known as:  LASIX     insulin glargine 100 UNIT/ML injection  Commonly known as:  LANTUS     pantoprazole 40 MG tablet  Commonly known as:  PROTONIX     potassium chloride SA 20 MEQ tablet  Commonly known as:  K-DUR,KLOR-CON      TAKE these medications       allopurinol 100 MG tablet  Commonly known as:  ZYLOPRIM  Take 100 mg by mouth daily.     hydrALAZINE 50 MG tablet  Commonly known as:  APRESOLINE  Take 1 tablet (50 mg total) by mouth 3 (three) times daily.     isosorbide dinitrate 10 MG tablet  Commonly known as:  ISORDIL  Take 1 tablet (10 mg total) by mouth 3 (three) times daily.     LORazepam 0.5 MG tablet  Commonly known as:  ATIVAN  Place 1 tablet (0.5 mg total) under the tongue every 4 (four) hours as needed for anxiety.     metoprolol 50 MG tablet  Commonly known as:  LOPRESSOR  Take 1 tablet (50 mg total) by mouth 2 (two) times daily.     morphine CONCENTRATE 10 mg / 0.5 ml concentrated solution  Take 0.1 mLs (2 mg total) by mouth every hour as needed for  severe pain.       No Known Allergies  The results of significant diagnostics from this hospitalization (including imaging, microbiology, ancillary and laboratory) are listed below for reference.    Significant Diagnostic Studies: Nm Pulmonary Perfusion  2013-10-22   CLINICAL DATA:  Shortness of breath. Respiratory failure and renal failure.  EXAM: NUCLEAR MEDICINE PULMONARY PERFUSION SCAN  TECHNIQUE: Perfusion images were obtained in multiple projections after intravenous injection of radiopharmaceutical.  COMPARISON:  CHEST RADIOGRAPH ON 2013-10-22  RADIOPHARMACEUTICALS:  6 mCi Tc22m MAA  FINDINGS: A nonsegmental perfusion defect is seen in the left lung corresponding to pleural fluid within the major fissure seen radiographically. No segmental pulmonary perfusion defects identified in either lung.  IMPRESSION: No segmental pulmonary perfusion defects identified.   Electronically Signed   By: Myles Rosenthal M.D.   On: 2013-10-22 21:44   Dg Chest Port 1 View  10/21/2013   CLINICAL DATA:  Respiratory failure  EXAM: PORTABLE CHEST - 1 VIEW  COMPARISON:  10/20/2013  FINDINGS: There is elevation of the right hemidiaphragm. Prominence of interstitial markings appreciated. Increased density projects in the right left lung bases there is blunting of the costophrenic angles. The cardiac silhouette is enlarged. Endotracheal tube is appreciated with tip approximately 4.5 cm above the carina. Calcifications identified within the cord to the aorta. The visualized osseous  structures unremarkable.  IMPRESSION: Interstitial infiltrate which is decreased in conspicuity when compared to the previous study. The likely reflecting decreased pulmonary edema. There is blunting of the costophrenic angles may represent small effusions. The densities within the lung bases of also decreased in conspicuity again likely right reflecting improved edema. Continued surveillance evaluation recommended. Sign report   Electronically  Signed   By: Salome Holmes M.D.   On: 10/21/2013 08:43   Dg Chest Port 1 View  10/19/2013   CLINICAL DATA:  Shortness of breath, altered mental status, weakness  EXAM: PORTABLE CHEST - 1 VIEW  COMPARISON:  Prior radiograph from 09/10/2013  FINDINGS: Mild cardiomegaly is stable as compared to the prior examination.  Lung volumes are within normal limits. There is diffuse pulmonary vascular congestion with interstitial thickening, compatible with pulmonary edema. Moderate bilateral pleural effusions are present. More confluent opacity at the left lung base likely represents edema and/ or atelectasis, although possible focal infiltrate is not excluded. No pneumothorax.  Osseous structures are within normal limits.  IMPRESSION: Diffuse pulmonary edema with moderate bilateral pleural effusions. More confluent opacity at the left lung base is favored to represent edema and/ or atelectasis, although a superimposed infiltrate could also have this appearance in the correct clinical setting.   Electronically Signed   By: Rise Mu M.D.   On: 10/19/2013 05:52    Microbiology: Recent Results (from the past 240 hour(s))  MRSA PCR SCREENING     Status: None   Collection Time    10/19/13 11:25 AM      Result Value Range Status   MRSA by PCR NEGATIVE  NEGATIVE Final   Comment:            The GeneXpert MRSA Assay (FDA     approved for NASAL specimens     only), is one component of a     comprehensive MRSA colonization     surveillance program. It is not     intended to diagnose MRSA     infection nor to guide or     monitor treatment for     MRSA infections.  CULTURE, BLOOD (ROUTINE X 2)     Status: None   Collection Time    10/19/13  9:31 PM      Result Value Range Status   Specimen Description BLOOD RIGHT WRIST   Final   Special Requests BOTTLES DRAWN AEROBIC AND ANAEROBIC 8CC   Final   Culture NO GROWTH 5 DAYS   Final   Report Status 10/24/2013 FINAL   Final  CULTURE, BLOOD (ROUTINE X  2)     Status: None   Collection Time    10/19/13  9:45 PM      Result Value Range Status   Specimen Description BLOOD RIGHT HAND   Final   Special Requests BOTTLES DRAWN AEROBIC AND ANAEROBIC [facility]   Final   Culture NO GROWTH 5 DAYS   Final   Report Status 10/24/2013 FINAL   Final     Labs: Basic Metabolic Panel:  Recent Labs Lab 10/19/13 0539 10/19/13 1525 10/20/13 0408 10/21/13 0416 10/22/13 0438  NA 141 134* 138 140 140  K 3.7 4.9 4.3 3.6 3.1*  CL 110 101 103 102 98  CO2  --  10* 23 28 25   GLUCOSE 263* 260* 168* 103* 117*  BUN 24* 29* 34* 33* 37*  CREATININE 2.10* 2.28* 2.83* 2.76* 2.36*  CALCIUM  --  9.3 9.5 9.6 9.8   Liver Function  Tests:  Recent Labs Lab 10/20/13 0408  AST 45*  ALT 21  ALKPHOS 65  BILITOT 0.6  PROT 6.5  ALBUMIN 3.0*   CBC:  Recent Labs Lab 10/19/13 0539 10/19/13 0544 10/20/13 0408 10/21/13 0416  WBC  --  11.1* 8.9 9.5  NEUTROABS  --  9.4*  --   --   HGB 8.5* 7.2* 8.5* 8.6*  HCT 25.0* 24.4* 26.5* 27.1*  MCV  --  77.0* 78.2 78.6  PLT  --  258 207 203   Cardiac Enzymes:  Recent Labs Lab 10/19/13 1127 10/19/13 2131 10/20/13 0408  TROPONINI 2.13* 7.14* 10.54*     Recent Labs  08/06/13 0321 09/10/13 2323 10/19/13 0643  PROBNP 222.8 1262.0* 1806.0*   CBG:  Recent Labs Lab 10/21/13 1957 10/21/13 2330 10/22/13 0334 10/22/13 0725 10/22/13 1138  GLUCAP 136* 142* 122* 117* 122*    Active Problems:   MI, acute, non ST segment elevation   Aortic stenosis   CKD (chronic kidney disease) stage 3, GFR 30-59 ml/min   DM (diabetes mellitus) type II uncontrolled with renal manifestation   Acute on chronic diastolic CHF (congestive heart failure), NYHA class 4   Acute respiratory failure   Anemia of chronic disease   HCAP (healthcare-associated pneumonia)   Time coordinating discharge: 35 minutes  Signed:  Brendia Sacks, MD [facility] 10/24/2013, 12:49 PM

## 2013-10-24 NOTE — Clinical Social Work Note (Signed)
Clinicals faxed to [facility], per [facility] staff one bed is available.  They will contact family to discuss.  Santa Genera, LCSW Clinical Social Worker [phone])

## 2013-10-24 NOTE — Clinical Social Work Psychosocial (Signed)
    Clinical Social Work Department BRIEF PSYCHOSOCIAL ASSESSMENT 10/24/2013  Patient:  [name], [name]     Account Number:  [id number]     Admit date:  10/19/2013  Clinical Social Worker:  Santa Genera, CLINICAL SOCIAL WORKER  Date/Time:  10/24/2013 09:00 AM  Referred by:  Physician  Date Referred:  10/24/2013 Referred for  Residential hospice placement   Other Referral:   Interview type:  Patient Other interview type:   Also spoke w family at bedside    PSYCHOSOCIAL DATA Living Status:  ALONE Admitted from facility:   Level of care:   Primary support name:  [name] and [name] Primary support relationship to patient:  CHILD, ADULT Degree of support available:   Significant psychosocial support from family    CURRENT CONCERNS Current Concerns  Post-Acute Placement   Other Concerns:    SOCIAL WORK ASSESSMENT / PLAN CSW met w patient and daughters at bedside, MD had just been there discussing options for end of life care w patient. Patient lives alone in [place], expressed his desire to go home w one of his daughters for end of life care.  Family arranging for him to be transported to [place] where one daughter lives.  Other daughter lives in [place].  Family has made it a priority for patient to die in presence of family and no one lives close by.  Family working w medical transport company owned by friend to arrange safe transport to [facility] (in [place] area). RN CM arranging for [facility] needs in [place].  Patient clearly expressed his wishes and family Hampton working to accommodate. CSW offered support, explained possibility of referral to [facility] in [facility], but family and patient want in home death at daughters home in [place].  CSW signing off as no further SW needs identified at this time.   Assessment/plan status:  Psychosocial Support/Ongoing Assessment of Needs Other assessment/ plan:   Information/referral to community resources:   Offered referral to  Hospice Home    PATIENT'S/FAMILY'S RESPONSE TO PLAN OF CARE: Family working to arrange to meet patient's wishes  Santa Genera, LCSW Clinical Social Worker [phone])

## 2013-10-24 NOTE — Discharge Planning (Signed)
Spoke with [name] (Daughter) concerning the care that Hospice at home would be able to provide.  She did have a misunderstanding, thinking that they'd be able to provide 24/7 care. I clarified to [name] that H[facility]ice at home would not be able to provide 24/7 care and would usually only have set appointments (possible 3-4 times a week).  [name] indicated that they'd just hire a private nurse if needed and thanked me for the clarification.  Family still hopes to have hospice come out to home and will try to get transportation set up to [place] as soon as possible.

## 2013-10-24 NOTE — Progress Notes (Signed)
[facility] PROGRESS NOTE  [name] ZOX:[id number] DOB: 1920/09/09 DOA: 10/19/2013 PCP: Evlyn Courier, MD Cardiologist: Dr. Purvis Sheffield  Assessment/Plan: 1. Acute hypoxic respiratory failure: Secondary to acute heart failure (combined systolic and diastolic), complicated by severe aortic stenosis, possible healthcare associated pneumonia. Required intubation with mechanical ventilation 11/23-11/25. 2. Acute on chronic diastolic congestive heart failure 3. Acute systolic heart failure: Mildly depressed ejection fraction 4. Severe aortic stenosis: Not a candidate for operative repair 5. Non-ST elevation MI: Treated medically. No intervention per cardiology. Medical management has been limited secondary to history GI bleed with gastric AVMs.  6. Possible healthcare associated pneumonia 7. Anemia: Likely chronic disease as well as iron deficiency from chronic GI blood loss. Transfused 2 units packed red blood cells this admission.  8. Acute renal failure superimposed on Chronic kidney disease stage III 9. Diabetes mellitus 10. Severe malnutrition in context of chronic illness  Discussed with daughters Gavin Pound and Claris Gower at bedside. Discussed options including home with hospice, residential hospice, home with home health, skilled nursing facility with palliative care. Patient declines all these options, he plans to go home with his daughter Claris Gower. All family members agreed that this is a good plan and his needs can be provided for her. The patient desires comfort care only, no further medical intervention and this seems reasonable.   Discharge into the care of his daughter, home oxygen 2 L per minute nasal cannula continuous. Home health RN.  Code Status: DNR, DNI; full comfort care DVT prophylaxis: none Family Communication: as above Disposition Plan: pending  Brendia Sacks, MD  [facility]  Pager [phone] If 7PM-7AM, please contact night-coverage at  [email or web address], password [facility] 10/24/2013, 12:38 PM  LOS: 5 days   Summary: 79 year old man presented with increasing shortness of breath, required CPAP in the emergency department. He briefly improved with Lasix, however again deteriorated and required intubation shortly after admission. Respiratory failure felt to be secondary to acute diastolic, systolic heart failure, non-ST elevation MI, possibly pneumonia. History complicated by history GI bleed with AVM prior to admission, severe aortic stenosis and advanced kidney disease.  After admission, he quickly decompensated and required intubation. He was started on a lasix infusion and followed by pulmonology and cardiology. His prognosis is very poor, but he was able to extubate.   Consultants:  Cardiology  Pulmonology Procedures:  Intubation 11/23 - 11/25 Transfusion 2 units packed red blood cells   2-D echocardiogram revealed LVEF 45-50%. Grade 3 diastolic dysfunction. Regional wall motion abnormalities, new. Severe aortic stenosis.  HPI/Subjective: Had a comfortable night. No Ativan or morphine since yesterday afternoon. Wants to go home.  Objective: Filed Vitals:   10/23/13 1126 10/23/13 1555 10/23/13 2153 10/24/13 0500  BP: 143/58 142/59 167/68 146/58  Pulse: 102 110 102 106  Temp: 98.3 F (36.8 C) 98.3 F (36.8 C) 98.2 F (36.8 C) 98.1 F (36.7 C)  TempSrc: Axillary Axillary Oral Oral  Resp: 32 35 30 24  Height:      Weight:      SpO2: 74% 73% 85% 83%    Intake/Output Summary (Last 24 hours) at 10/24/13 1238 Last data filed at 10/24/13 0700  Gross per 24 hour  Intake      5 ml  Output    425 ml  Net   -420 ml     Filed Weights   10/20/13 0500 10/21/13 0500 10/22/13 0402  Weight: 66.8 kg (147 lb 4.3 oz) 69 kg (152 lb 1.9 oz) 64.91 kg (143 lb 1.6 oz)  Exam:   General: Appears calm, comfortable.  Cardiovascular: Regular rate and rhythm. No murmur, rub or gallop.  Respiratory: Clear to auscultation  bilaterally. No wheezes, rales or rhonchi.  No change in exam 11/20  Data Reviewed:  No new data  Scheduled Meds: . allopurinol  100 mg Oral Daily  . hydrALAZINE  50 mg Oral TID  . isosorbide dinitrate  10 mg Oral TID  . metoprolol  50 mg Oral BID  . sodium chloride  3 mL Intravenous Q12H   Continuous Infusions:    Active Problems:   MI, acute, non ST segment elevation   Aortic stenosis   CKD (chronic kidney disease) stage 3, GFR 30-59 ml/min   DM (diabetes mellitus) type II uncontrolled with renal manifestation   Acute on chronic diastolic CHF (congestive heart failure), NYHA class 4   Acute respiratory failure   Anemia of chronic disease   HCAP (healthcare-associated pneumonia)

## 2013-10-24 NOTE — Discharge Planning (Signed)
As documented pt's O2 levels have remained at 85% or lower on 2L-[place] at rest.  Pt will need O2 at discharge.

## 2013-10-24 NOTE — Clinical Social Work Note (Signed)
CSW gave daughter contact information for [facility] ambulance service as well as air ambulance service.  Daughter expressed that these were both too expensive and she was researching on internet to find transport that was more economical.  [facility] cannot transport until Monday due to reduced staffing, estimates cost at $4000.  Santa Genera, LCSW Clinical Social Worker [phone])

## 2013-10-24 NOTE — Clinical Social Work Note (Signed)
Hospice weekend on call nurse requested call when patient discharges - [phone] (amy).  Concerned that family has said they will not have someone w patient around the clock.  RN advised, will contact [facility].  Santa Genera, LCSW Clinical Social Worker [phone])

## 2013-10-24 NOTE — Discharge Planning (Signed)
Gavin Pound (daughter) came back to hospital and told me that the pt would have to stay till tomorrow and possibly Monday till they could find him a way to [place].  Apparently, she confirmed that [facility] would NOT be able to transport pt to [place] with O2.  I informed her that Dr. Irene Limbo had [facility] pt and feels that we have done all we can do for him at this point.  Pt would be given pain medicine and O2 and be DCd to his home, where family could take care of him till transportation to [place] was confirmed.  I also explained that Dr. Irene Limbo indicated INS would probably not be paying for any care beyond today.  Gavin Pound was firm that her dad doesn't have a hospital bed at home and he could not be cared for safely since family is in the process of packing up his apartment.   She said, "family would deal with Insurance  later and we (the hospital) could not push the pt out on the street."

## 2013-10-25 MED ORDER — PHENOL 1.4 % MT LIQD
1.0000 | OROMUCOSAL | Status: DC | PRN
  Administered 2013-10-25: 1 via OROMUCOSAL
  Filled 2013-10-25: qty 177

## 2013-10-25 NOTE — Progress Notes (Signed)
[facility] PROGRESS NOTE  [name] WUJ:[id number] DOB: 1920-11-11 DOA: 10/19/2013 PCP: Evlyn Courier, MD Cardiologist: Dr. Purvis Sheffield  Assessment/Plan: 1. Acute hypoxic respiratory failure: remains stable. Secondary to acute heart failure (combined systolic and diastolic), complicated by severe aortic stenosis, possible healthcare associated pneumonia. Required intubation with mechanical ventilation 11/23-11/25. 2. Acute on chronic diastolic congestive heart failure 3. Acute systolic heart failure: Mildly depressed ejection fraction 4. Severe aortic stenosis: Not a candidate for operative repair 5. Non-ST elevation MI: Treated medically. No invasive intervention per cardiology. Medical management has been limited secondary to history GI bleed with gastric AVMs.  6. Possible healthcare associated pneumonia 7. Anemia: Likely chronic disease as well as iron deficiency from chronic GI blood loss. Transfused 2 units packed red blood cells this admission.  8. Acute renal failure superimposed on Chronic Hampton disease stage III 9. Diabetes mellitus 10. Severe malnutrition in context of chronic illness: comfort feeds  Last evening's events noted. Family was unable to secure transport to [place] and refused discharge; they have appealed discharge. They are cleaning out the patient's house and do not feel they can take him there. There are no hospice beds and SNF has been refused. [name] at bedside and reports plan is take patient via privately arranged transport 12/2. We discussed that patient is stable for discharge.   Continue comfort measures, morphine and ativan as needed, oxygen for comfort only, cardiac meds for comfort; patient has been mostly refusing medications.  Remains medically stable for discharge. Based on patient's repeatedly expressed desire for no further care, refusal of most medications and family desire to comply with his wishes, I don't think further hospitalization  is of benefit. Will continue to care for patient until safe disposition has been arranged  Code Status: DNR, DNI; full comfort care DVT prophylaxis: none Family Communication: as above  Disposition Plan: pending  Brendia Sacks, MD  [facility]  Pager [phone] If 7PM-7AM, please contact night-coverage at [email or web address], password [facility] 10/25/2013, 9:34 AM  LOS: 6 days   Summary: 79 year old man presented with increasing shortness of breath, required CPAP in the emergency department. He briefly improved with Lasix, however again deteriorated and required intubation shortly after admission. Respiratory failure felt to be secondary to acute diastolic, systolic heart failure, non-ST elevation MI, possibly pneumonia. History complicated by history GI bleed with AVM prior to admission, severe aortic stenosis and advanced Hampton disease.  After admission, he quickly decompensated and required intubation. He was started on a lasix infusion and followed by pulmonology and cardiology. His prognosis is very poor, but he was able to extubate.   Consultants:  Cardiology  Pulmonology Procedures:  Intubation 11/23 - 11/25 Transfusion 2 units packed red blood cells   2-D echocardiogram revealed LVEF 45-50%. Grade 3 diastolic dysfunction. Regional wall motion abnormalities, new. Severe aortic stenosis.  HPI/Subjective: Discussed with daughter [name] at bedside. Patient was uncomfortable last night but is now resting comfortably this morning. Pain medication appears to be effective. Hypoxia has improved.  Objective: Filed Vitals:   10/23/13 2153 10/24/13 0500 10/24/13 2134 10/25/13 0646  BP: 167/68 146/58 143/70 135/63  Pulse: 102 106 95 83  Temp: 98.2 F (36.8 C) 98.1 F (36.7 C) 97.6 F (36.4 C) 97.6 F (36.4 C)  TempSrc: Oral Oral Oral Oral  Resp: 30 24 20 24   Height:      Weight:      SpO2: 85% 83% 92% 90%    Intake/Output Summary (Last 24 hours) at 10/25/13 8657 Last data filed  at 10/25/13 0649  Gross per 24 hour  Intake    150 ml  Output   1050 ml  Net   -900 ml     Filed Weights   10/20/13 0500 10/21/13 0500 10/22/13 0402  Weight: 66.8 kg (147 lb 4.3 oz) 69 kg (152 lb 1.9 oz) 64.91 kg (143 lb 1.6 oz)    Exam:   General: Appears calm, comfortable, peaceful.  Data Reviewed:  No new data  Scheduled Meds: . allopurinol  100 mg Oral Daily  . hydrALAZINE  50 mg Oral TID  . isosorbide dinitrate  10 mg Oral TID  . metoprolol  50 mg Oral BID  . sodium chloride  3 mL Intravenous Q12H   Continuous Infusions:    Active Problems:   MI, acute, non ST segment elevation   Aortic stenosis   CKD (chronic Hampton disease) stage 3, GFR 30-59 ml/min   DM (diabetes mellitus) type II uncontrolled with renal manifestation   Acute on chronic diastolic CHF (congestive heart failure), NYHA class 4   Acute respiratory failure   Anemia of chronic disease   HCAP (healthcare-associated pneumonia)   Time: 20 minutes

## 2013-10-25 NOTE — Discharge Planning (Addendum)
AC concluded that [facility] would not be a good fit for pt.  Faxed pt med list, facesheet and most current H&P to [facility] at [phone], per company's phone request.  Family confirmed that this was the company set to transport to [place]. Family informed info was faxed. On Tues 12/2, company will call [facility] floor and ask for RN taking care of pt to get report.  If you need to reach [facility] - Please call [phone].

## 2013-10-25 NOTE — Discharge Planning (Signed)
Medicare has not called to deny or accept  hospital payments at this time. Pt/family is still in the appeals process of their discharge.

## 2013-10-25 NOTE — Discharge Planning (Addendum)
Called [facility] (per Ozzie Hoyle, [facility]) request - attempting to find out if a room is available. Answering service had to page chrg nurse and have them call me back.  Called back and said no room is available at this time [facility] notified and said she check on a possible room at [facility].

## 2013-10-25 NOTE — Discharge Planning (Signed)
Spoke with Claris Gower (Daughter).  She indicated that family has found transportation that will be able to provide O2 to [place].  They're unable to transfer till Tues and  pt will need to stay here till then.  Dr. Irene Limbo is aware of family's plans.

## 2013-10-26 NOTE — Plan of Care (Signed)
Pt stated he would like to speak to Dr. Irene Limbo.  Dr. Was contacted and indicted that he'd be happy to stop by, but it would probably be a few hours till he could make it by.  Family informed.

## 2013-10-26 NOTE — Plan of Care (Signed)
Pt wanted to confirm that he had no "mechanical machines keeping him alive.  He was informed that the only thing that may be prolonging his life at this moment is oxygen.  He was asked if he'd prefer [place] to remove the oxygen and he said, "no, I don't want to committ suicide, but I don't want any mechanical machine to prolong my life.  When it's time, just let me go. I'm ready to go."  I assured him that we would follow through with his wishes.  Family was in the room during this discussion.  Dr. Irene Limbo informed.

## 2013-10-26 NOTE — Progress Notes (Addendum)
[facility] PROGRESS NOTE  [name] WUJ:[id number] DOB: 11-19-1920 DOA: 10/19/2013 PCP: Evlyn Courier, MD Cardiologist: Dr. Purvis Sheffield  Assessment/Plan: 1. Acute hypoxic respiratory failure: remains stable. Secondary to acute heart failure (combined systolic and diastolic), complicated by severe aortic stenosis, possible healthcare associated pneumonia. Required intubation with mechanical ventilation 11/23-11/25. 2. Acute on chronic diastolic congestive heart failure 3. Acute systolic heart failure: Mildly depressed ejection fraction 4. Severe aortic stenosis: Not a candidate for operative repair 5. Non-ST elevation MI: Treated medically. No invasive intervention per cardiology. Medical management has been limited secondary to history GI bleed with gastric AVMs.  6. Possible healthcare associated pneumonia 7. Anemia: Likely chronic disease as well as iron deficiency from chronic GI blood loss. Transfused 2 units packed red blood cells this admission.  8. Acute renal failure superimposed on Chronic kidney disease stage III 9. Diabetes mellitus 10. Severe malnutrition in context of chronic illness: comfort feeds  Discussed with daughter [name] at bedside. They plan to arrange for his transport to [place] 12/2. Currently discharged remains under appeal by family.   Continue comfort measures, morphine and ativan as needed, oxygen for comfort only, cardiac meds for comfort; patient has been mostly refusing medications.  Remains medically stable for discharge. Based on patient's repeatedly expressed desire for no further care, refusal of most medications and family desire to comply with his wishes, I don't think further hospitalization is of benefit. Will continue to care for patient until safe disposition has been arranged  Code Status: DNR, DNI; full comfort care DVT prophylaxis: none Family Communication: as above  Disposition Plan: pending  Brendia Sacks, MD  [facility]  Pager [phone] If 7PM-7AM, please contact night-coverage at [email or web address], password [facility] 10/26/2013, 7:26 PM  LOS: 7 days   Summary: 79 year old man presented with increasing shortness of breath, required CPAP in the emergency department. He briefly improved with Lasix, however again deteriorated and required intubation shortly after admission. Respiratory failure felt to be secondary to acute diastolic, systolic heart failure, non-ST elevation MI, possibly pneumonia. History complicated by history GI bleed with AVM prior to admission, severe aortic stenosis and advanced kidney disease.  After admission, he quickly decompensated and required intubation. He was started on a lasix infusion and followed by pulmonology and cardiology. His prognosis is very poor, but he was able to extubate.   Consultants:  Cardiology  Pulmonology Procedures:  Intubation 11/23 - 11/25 Transfusion 2 units packed red blood cells   2-D echocardiogram revealed LVEF 45-50%. Grade 3 diastolic dysfunction. Regional wall motion abnormalities, new. Severe aortic stenosis.  HPI/Subjective: Currently comfortable. Had many visitors today from church.  Objective: Filed Vitals:   10/25/13 0646 10/25/13 1420 10/25/13 2100 10/25/13 2208  BP: 135/63 127/72  143/72  Pulse: 83 81 83 86  Temp: 97.6 F (36.4 C) 97.9 F (36.6 C)  97.8 F (36.6 C)  TempSrc: Oral   Oral  Resp: 24 20 20 20   Height:      Weight:      SpO2: 90% 95% 96% 97%    Intake/Output Summary (Last 24 hours) at 10/26/13 1926 Last data filed at 10/26/13 1342  Gross per 24 hour  Intake    330 ml  Output   1400 ml  Net  -1070 ml     Filed Weights   10/20/13 0500 10/21/13 0500 10/22/13 0402  Weight: 66.8 kg (147 lb 4.3 oz) 69 kg (152 lb 1.9 oz) 64.91 kg (143 lb 1.6 oz)    Exam:   General:  Appears to be resting comfortably.  Data Reviewed:  No new data  Scheduled Meds: . allopurinol  100 mg Oral Daily  . hydrALAZINE  50  mg Oral TID  . isosorbide dinitrate  10 mg Oral TID  . metoprolol  50 mg Oral BID  . sodium chloride  3 mL Intravenous Q12H   Continuous Infusions:    Active Problems:   MI, acute, non ST segment elevation   Aortic stenosis   CKD (chronic kidney disease) stage 3, GFR 30-59 ml/min   DM (diabetes mellitus) type II uncontrolled with renal manifestation   Acute on chronic diastolic CHF (congestive heart failure), NYHA class 4   Acute respiratory failure   Anemia of chronic disease   HCAP (healthcare-associated pneumonia)   Time: 15 minutes

## 2013-10-27 NOTE — Progress Notes (Signed)
[facility] PROGRESS NOTE  [name] AVW:[id number] DOB: [birth year] DOA: 10/19/2013 PCP: Evlyn Courier, MD Cardiologist: Dr. Purvis Sheffield  Assessment/Plan: 1. Acute hypoxic respiratory failure: stable. Secondary to acute heart failure (combined systolic and diastolic), complicated by severe aortic stenosis, possible healthcare associated pneumonia. Required intubation with mechanical ventilation 11/23-11/25. 2. Acute on chronic diastolic congestive heart failure 3. Acute systolic heart failure: Mildly depressed ejection fraction 4. Severe aortic stenosis: Not a candidate for operative repair 5. Non-ST elevation MI: Treated medically. No invasive intervention per cardiology. Medical management has been limited secondary to history GI bleed with gastric AVMs.  6. Possible healthcare associated pneumonia 7. Anemia: Likely chronic disease as well as iron deficiency from chronic GI blood loss. Transfused 2 units packed red blood cells this admission.  8. Acute renal failure superimposed on Chronic kidney disease stage III 9. Diabetes mellitus 10. Severe malnutrition in context of chronic illness: comfort feeds  Hospice bed available, family have declined. He planned to have him transported to [place] to hospice residential facility. Discharge was appealed by family, have not heard from Medicare.   Continue comfort measures, morphine and ativan as needed, oxygen for comfort only, cardiac meds for comfort; patient has been mostly refusing medications.  Remains medically stable for discharge. Based on patient's repeatedly expressed desire for no further care, refusal of most medications and family desire to comply with his wishes, I don't think further hospitalization is of benefit. Will continue to care for patient until safe disposition has been arranged  Code Status: DNR, DNI; full comfort care DVT prophylaxis: none Family Communication: as above  Disposition Plan: pending  Brendia Sacks, MD  [facility]  Pager [phone] If 7PM-7AM, please contact night-coverage at [email or web address], password [facility] 10/27/2013, 12:13 PM  LOS: 8 days   Summary: 79 year old man presented with increasing shortness of breath, required CPAP in the emergency department. He briefly improved with Lasix, however again deteriorated and required intubation shortly after admission. Respiratory failure felt to be secondary to acute diastolic, systolic heart failure, non-ST elevation MI, possibly pneumonia. History complicated by history GI bleed with AVM prior to admission, severe aortic stenosis and advanced kidney disease.  After admission, he quickly decompensated and required intubation. He was started on a lasix infusion and followed by pulmonology and cardiology. His prognosis is very poor, but he was able to extubate.   Consultants:  Cardiology  Pulmonology Procedures:  Intubation 11/23 - 11/25 Transfusion 2 units packed red blood cells   2-D echocardiogram revealed LVEF 45-50%. Grade 3 diastolic dysfunction. Regional wall motion abnormalities, new. Severe aortic stenosis.  HPI/Subjective: Doing well. No complaints. Now needs at this point.  Objective: Filed Vitals:   10/25/13 2208 10/26/13 2031 10/27/13 1201 10/27/13 1202  BP: 143/72 144/63 132/48   Pulse: 86 90  89  Temp: 97.8 F (36.6 C) 98.5 F (36.9 C)    TempSrc: Oral Oral    Resp: 20 20    Height:      Weight:      SpO2: 97% 97%      Intake/Output Summary (Last 24 hours) at 10/27/13 1213 Last data filed at 10/27/13 0400  Gross per 24 hour  Intake    240 ml  Output    975 ml  Net   -735 ml     Filed Weights   10/20/13 0500 10/21/13 0500 10/22/13 0402  Weight: 66.8 kg (147 lb 4.3 oz) 69 kg (152 lb 1.9 oz) 64.91 kg (143 lb 1.6 oz)  Exam:   General: Appears calm, comfortable. Alert. Speech fluent and clear.  Data Reviewed:  No new data  Scheduled Meds: . allopurinol  100 mg Oral Daily  .  hydrALAZINE  50 mg Oral TID  . isosorbide dinitrate  10 mg Oral TID  . metoprolol  50 mg Oral BID   Continuous Infusions:    Active Problems:   MI, acute, non ST segment elevation   Aortic stenosis   CKD (chronic kidney disease) stage 3, GFR 30-59 ml/min   DM (diabetes mellitus) type II uncontrolled with renal manifestation   Acute on chronic diastolic CHF (congestive heart failure), NYHA class 4   Acute respiratory failure   Anemia of chronic disease   HCAP (healthcare-associated pneumonia)   Time: 15 minutes

## 2013-10-27 NOTE — Clinical Social Work Note (Signed)
Call from [place], [facility].  They would like to assess patient if family is interested, they have one bed currently.  Santa Genera, LCSW Clinical Social Worker [phone])

## 2013-10-27 NOTE — Clinical Social Work Note (Signed)
CSW offered bed at [facility] to family, daughter Kaci Freel declined bed and stated they have arranged for patient to be transported to a home in [place].  He will be admitted to hospice at home, under the care of a hospice agency.  Per Santa Genera, sister Claris Gower is arranging hospice in [place], Gavin Pound does not know name of hospice.  Will ask sister for name, RN CM may be able to provide documents needed to hospice.  Patient witnessed signing request for medical records, daughter wants to bring w her.  Transport has been arranged through a medical transporter out of [place], will include oxygen.  Daughter allowed to ride in transport w patient.  Transport set for tomorrow, Tuesday, being arranged by family.  Santa Genera, LCSW Clinical Social Worker [phone])

## 2013-10-28 MED ORDER — MORPHINE SULFATE (CONCENTRATE) 10 MG /0.5 ML PO SOLN: 2.0000 mg | ORAL | Status: DC | PRN

## 2013-10-28 MED ORDER — MORPHINE SULFATE (CONCENTRATE) 10 MG /0.5 ML PO SOLN: 2.0000 mg | ORAL | Status: AC | PRN

## 2013-10-28 NOTE — Progress Notes (Addendum)
[facility] PROGRESS NOTE  [name] ZOX:[id number] DOB: [birth year] DOA: 10/19/2013 PCP: Evlyn Courier, MD Cardiologist: Dr. Purvis Sheffield  Resting comfortably. No new issues. Plan is for discharge today. Family has made prior arrangements for transport to the daughter Charlotte's home in [place] with home hospice.  See updated discharge summary.  Brendia Sacks, MD [facility] [phone]

## 2013-10-28 NOTE — Progress Notes (Signed)
Called Regan Rakers, daughter and power of attorney, to review discharge instructions with her.  Verbalized understanding.  Ms. Dearinger asked for a copy of the d/c instructions be faxed to her.  Faxed copy of d/c instructions to daughter as requested.  Patient d/c to daughter's home in [place] via ambulance.  Schonewitz, Candelaria Stagers 10/28/2013

## 2013-10-28 NOTE — Clinical Social Work Note (Signed)
[facility], [address] [place], requested clinicals re patient to review in order to determine whether patient can be accepted at their inpatient facility.  Facility will be reviewing documents today and making determination re whether they can assist family either in their short term residential hospice or through their hospice at home program.  Documents faxed.  MD notified of current status of plan.  Santa Genera, LCSW Clinical Social Worker [phone])

## 2013-10-28 NOTE — Progress Notes (Signed)
Oxygen removed from patient for fifteen minutes to check oxygen level.  Found patient to be at 86% oxygen saturation on room air at rest.  Replaced oxygen back at 2 lpm nasal cannula.  Oxygen level rebounded to 95% on 2 lpm [place]. Schonewitz, Candelaria Stagers 10/28/2013

## 2013-11-27 DEATH — deceased

## 2013-12-02 ENCOUNTER — Telehealth: Payer: Self-pay

## 2013-12-02 NOTE — Telephone Encounter (Signed)
Patient past away per Obituary in [place] [employer] °

## 2014-01-07 ENCOUNTER — Ambulatory Visit: Payer: PRIVATE HEALTH INSURANCE | Admitting: Podiatry

## 2014-04-24 NOTE — Progress Notes (Signed)
This encounter was created in error - please disregard.

## 2014-09-06 IMAGING — CT CT CHEST W/O CM
2 of 4 series · 12 of 36 positions shown, 15 images · non-contrast
Comparison: 01/04/2009

CT CHEST

CLINICAL DATA: Fever, generalized weakness, weight loss, chest
pain, dyspnea on exertion, not this is, abdominal pain and
hematuria.  History of prostate carcinoma.

CT CHEST, ABDOMEN AND PELVIS WITHOUT CONTRAST
TECHNIQUE: Multidetector CT imaging of the chest, abdomen and
pelvis was performed following the standard protocol with oral
contrast but without IV contrast.

[Series 2: cap w/o 5.0 b40f · axial · non-contrast · 0.68mm/px · z∈[-642,-57]mm · 9 of 133 slices shown, 12 images]
[im 8/133  mediastinal]
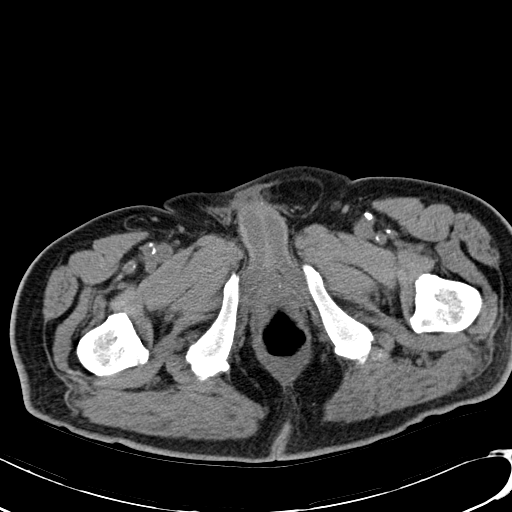
[im 8/133  lung]
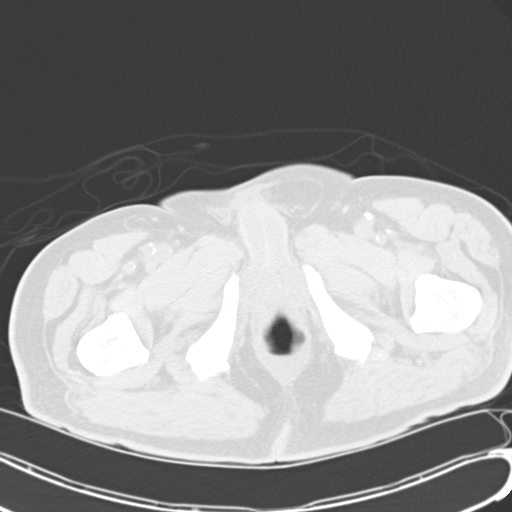
[im 23/133  lung]
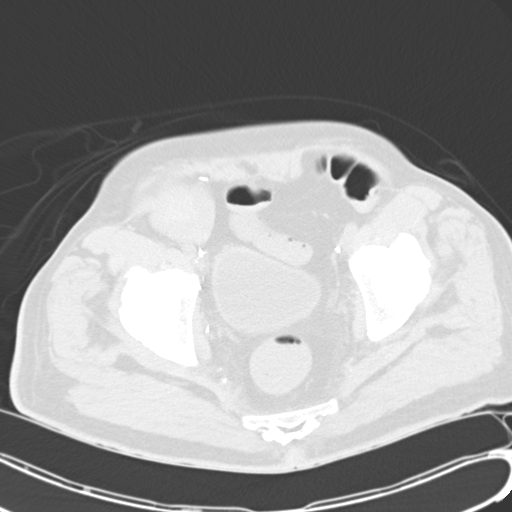
[im 37/133  lung]
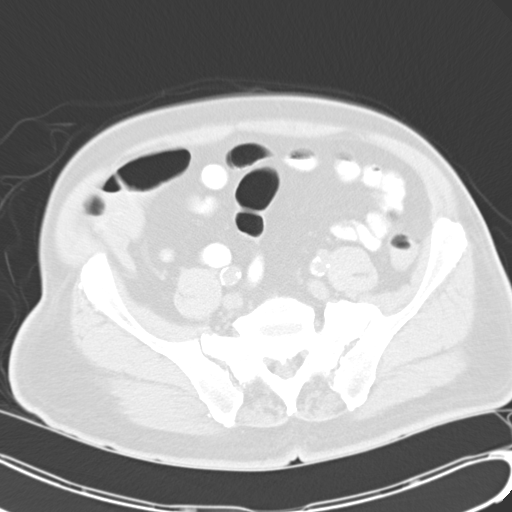
[im 52/133  lung]
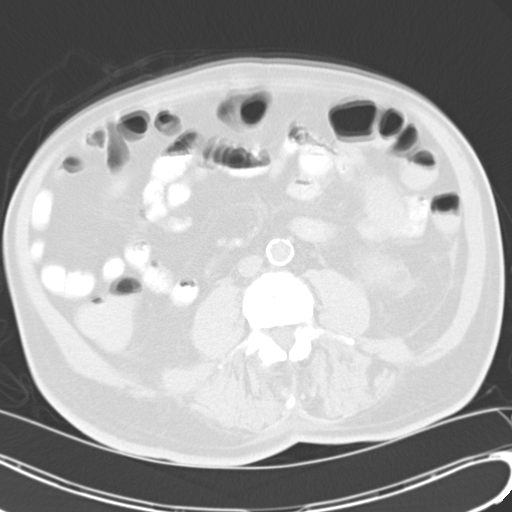
[im 67/133  mediastinal]
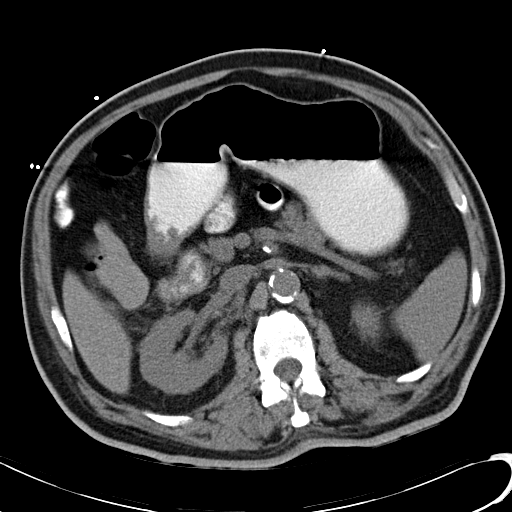
[im 67/133  lung]
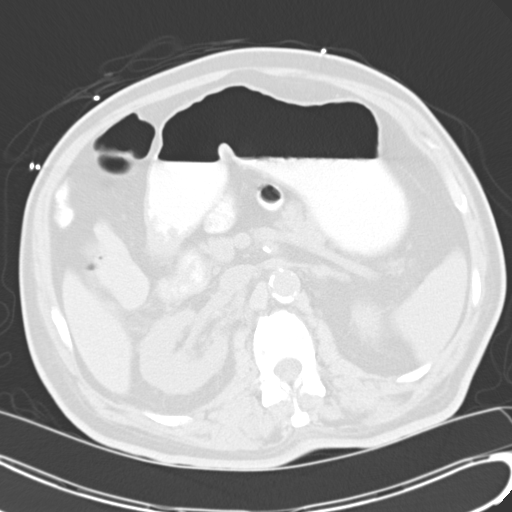
[im 81/133  lung]
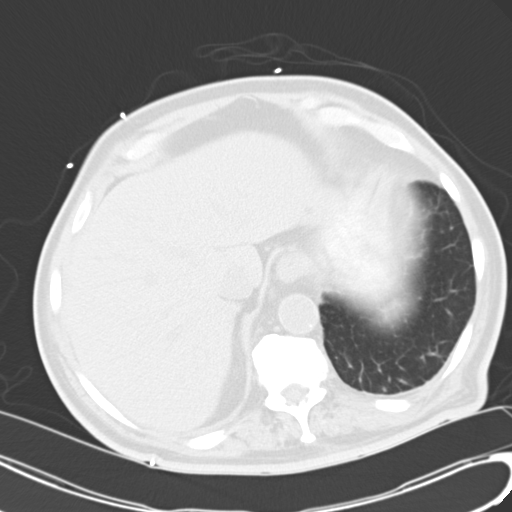
[im 96/133  lung]
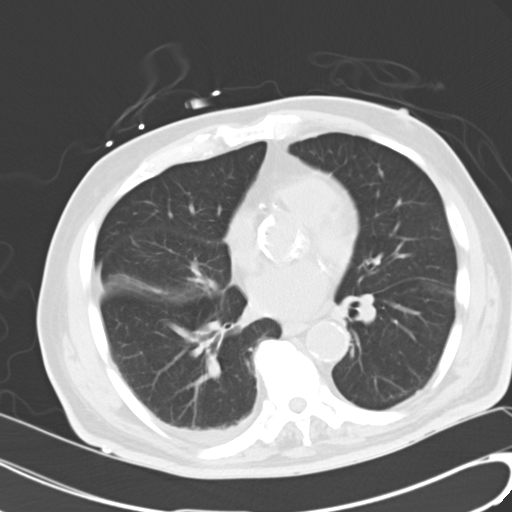
[im 111/133  lung]
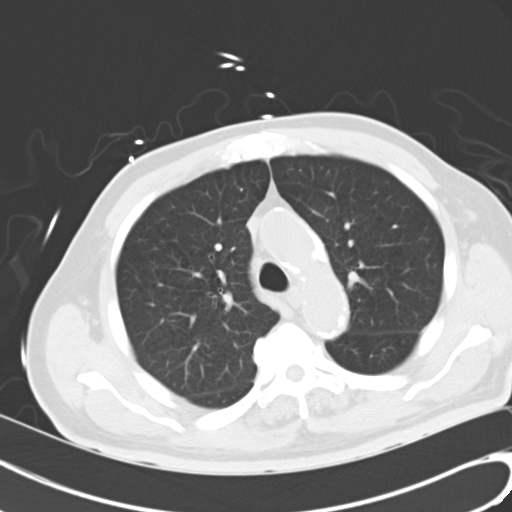
[im 125/133  mediastinal]
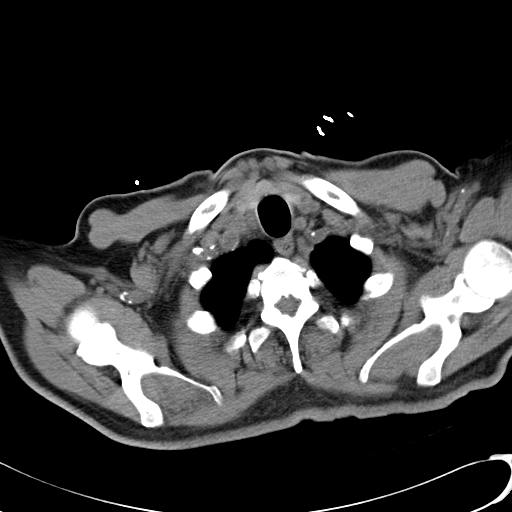
[im 125/133  lung]
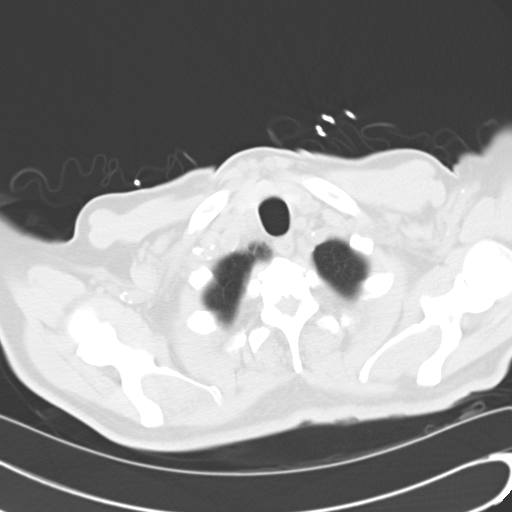

[Series 4: mpr coro cap (id) · coronal · 0.71mm/px · 3 of 101 slices shown]
[im 21/101  lung]
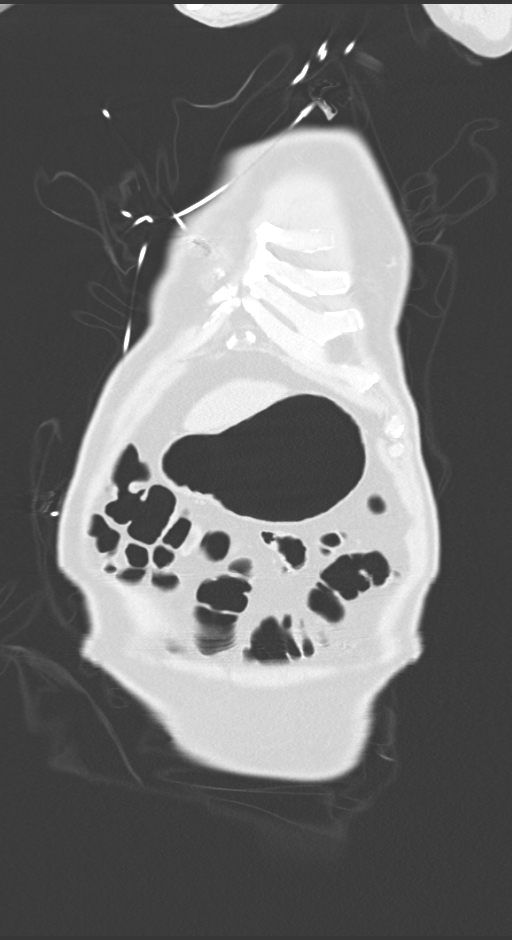
[im 41/101  lung]
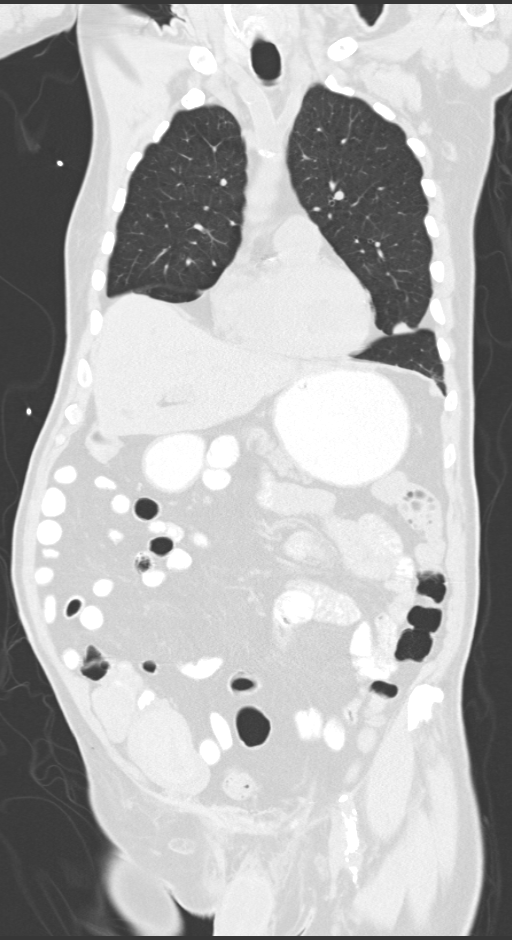
[im 61/101  lung]
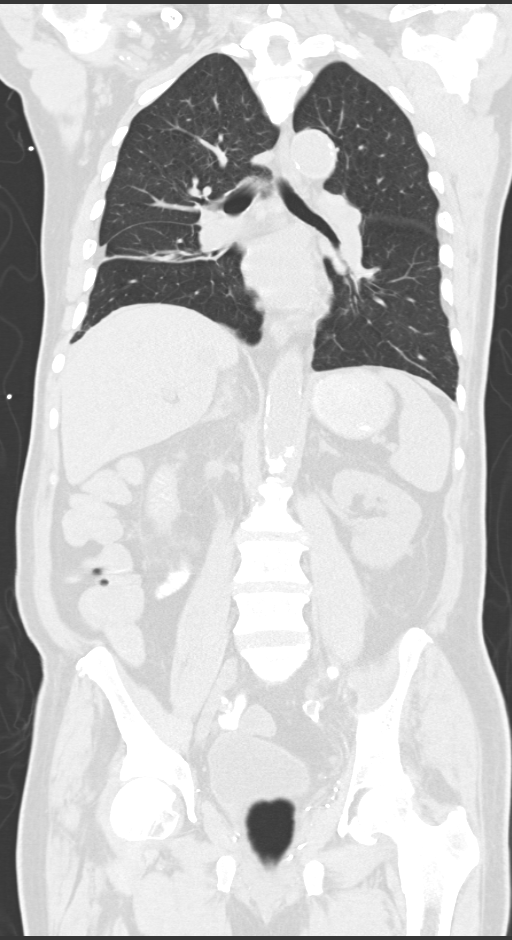

[12 of 36 positions shown; findings below may reference images not displayed]

FINDINGS: The heart is normal in size.  There are dense coronary
artery calcifications and calcifications along the thoracic aorta
and at the origins of the branch vessels.  There are sub centimeter
mediastinal lymph nodes.  No mediastinal masses or pathologically
enlarged lymph nodes.  The great vessels are normal in caliber.  No
hilar masses or adenopathy.

Small right pleural effusion.  There is mild dependent subsegmental
atelectasis and thickening of the inferior oblique fissures.  There
is some mild right lower lobe atelectasis adjacent to the oblique
fissure inferiorly.  The lungs are otherwise clear.

There are degenerative changes of the thoracic spine.  No
osteoblastic or osteolytic lesions.
IMPRESSION: No acute findings.  Dense coronary artery calcifications.  Small
right pleural effusion.  Mild lung base subsegmental atelectasis.
No evidence of malignancy and no infiltrate or edema.

CT ABDOMEN AND PELVIS
FINDINGS: Liver shows small low density lesions, the largest in
the posterior segment of the right lobe measuring 14.5 mm.  These
are consistent with cysts.  A small calcification lies at the dome
of the liver consistent with healed granuloma.  Liver is otherwise
unremarkable.  Normal spleen.  Normal gallbladder.  A stone
projects in the vicinity of the confluence of the cystic duct or
common duct. This may be vascular, but is nonspecific.  It is not
felt to be a duct stone since it was present previously.  The
pancreas is unremarkable.  No discrete adrenal masses.

The kidneys show no masses or stones or hydronephrosis.  There is
mild bilateral renal cortical thinning and is presumed to be
chronic, although is increased from the prior exam.  The ureters
are normal course and caliber.  The bladder is unremarkable.

There are numerous vascular clips in the pelvis consistent with a
previous radical prostatectomy.  A fat containing left inguinal
hernia is incidentally noted.  There are no pelvic masses.  No
pathologically enlarged lymph nodes in the no abnormal fluid
collections.

Air-fluid levels are noted throughout the colon as well as in the
small bowel.  The stomach is moderately distended.  There is no
bowel wall thickening or adjacent inflammatory changes.  A normal
appendix is not visualized but there is no evidence of
appendicitis.  The mesentery is unremarkable.

There is dense calcification along the abdominal aorta and its
branch vessels.  No aneurysm.

There are changes from posterior spine surgery in the lower lumbar
spine with spinous process resection.  There are changes throughout
the visualized spine and involving the SI joints.  There are no
osteoblastic or osteolytic lesions.

Findings are similar to the prior exam.
IMPRESSION: Air-fluid levels in the colon small bowel are nonspecific.  This
could reflect a gastroenteritis.  No bowel wall thickening or
evidence of inflammation in the mesentery.

No other evidence of acute abnormality in the abdomen or pelvis.
Chronic findings include liver cysts.  There is perinephric
stranding that is presumed to be chronic.  There are chronic
changes from a previous radical prostatectomy.  There are
degenerative changes and postsurgical changes of the lumbar spine
and dense aortoiliac vascular calcifications.  No findings to
explain hematuria.
